# Patient Record
Sex: Male | Born: 1945 | Race: White | Hispanic: No | Marital: Married | Smoking: Former smoker
Health system: Southern US, Community
[De-identification: ages and names within clinical notes are randomized; demographics above are authoritative.]

## PROBLEM LIST (undated history)

## (undated) DIAGNOSIS — K4090 Unilateral inguinal hernia, without obstruction or gangrene, not specified as recurrent: Secondary | ICD-10-CM

## (undated) DIAGNOSIS — K219 Gastro-esophageal reflux disease without esophagitis: Secondary | ICD-10-CM

## (undated) DIAGNOSIS — Z87891 Personal history of nicotine dependence: Principal | ICD-10-CM

## (undated) DIAGNOSIS — D72829 Elevated white blood cell count, unspecified: Secondary | ICD-10-CM

## (undated) DIAGNOSIS — K5792 Diverticulitis of intestine, part unspecified, without perforation or abscess without bleeding: Secondary | ICD-10-CM

## (undated) DIAGNOSIS — I2510 Atherosclerotic heart disease of native coronary artery without angina pectoris: Secondary | ICD-10-CM

## (undated) DIAGNOSIS — J449 Chronic obstructive pulmonary disease, unspecified: Secondary | ICD-10-CM

## (undated) HISTORY — DX: Diverticulitis of intestine, part unspecified, without perforation or abscess without bleeding: K57.92

## (undated) HISTORY — PX: TONSILLECTOMY: SUR1361

## (undated) HISTORY — DX: Chronic obstructive pulmonary disease, unspecified: J44.9

## (undated) HISTORY — PX: HERNIA REPAIR: SHX51

## (undated) HISTORY — DX: Unilateral inguinal hernia, without obstruction or gangrene, not specified as recurrent: K40.90

## (undated) HISTORY — DX: Atherosclerotic heart disease of native coronary artery without angina pectoris: I25.10

## (undated) HISTORY — DX: Gastro-esophageal reflux disease without esophagitis: K21.9

## (undated) HISTORY — DX: Elevated white blood cell count, unspecified: D72.829

## (undated) HISTORY — DX: Personal history of nicotine dependence: Z87.891

---

## 2014-03-11 DIAGNOSIS — M9903 Segmental and somatic dysfunction of lumbar region: Secondary | ICD-10-CM | POA: Diagnosis not present

## 2014-03-11 DIAGNOSIS — M5442 Lumbago with sciatica, left side: Secondary | ICD-10-CM | POA: Diagnosis not present

## 2014-06-01 DIAGNOSIS — M1712 Unilateral primary osteoarthritis, left knee: Secondary | ICD-10-CM | POA: Diagnosis not present

## 2014-06-01 DIAGNOSIS — M170 Bilateral primary osteoarthritis of knee: Secondary | ICD-10-CM | POA: Diagnosis not present

## 2015-06-23 DIAGNOSIS — W57XXXA Bitten or stung by nonvenomous insect and other nonvenomous arthropods, initial encounter: Secondary | ICD-10-CM | POA: Diagnosis not present

## 2015-06-23 DIAGNOSIS — S50361A Insect bite (nonvenomous) of right elbow, initial encounter: Secondary | ICD-10-CM | POA: Diagnosis not present

## 2015-06-23 DIAGNOSIS — R21 Rash and other nonspecific skin eruption: Secondary | ICD-10-CM | POA: Diagnosis not present

## 2015-07-05 ENCOUNTER — Ambulatory Visit (INDEPENDENT_AMBULATORY_CARE_PROVIDER_SITE_OTHER): Payer: Medicare Other | Admitting: Family Medicine

## 2015-07-05 ENCOUNTER — Encounter: Payer: Self-pay | Admitting: Family Medicine

## 2015-07-05 VITALS — BP 110/72 | HR 70 | Temp 97.9°F | Ht 71.0 in | Wt 166.1 lb

## 2015-07-05 DIAGNOSIS — R252 Cramp and spasm: Secondary | ICD-10-CM

## 2015-07-05 DIAGNOSIS — Z0001 Encounter for general adult medical examination with abnormal findings: Secondary | ICD-10-CM | POA: Diagnosis not present

## 2015-07-05 DIAGNOSIS — H9193 Unspecified hearing loss, bilateral: Secondary | ICD-10-CM

## 2015-07-05 DIAGNOSIS — Z136 Encounter for screening for cardiovascular disorders: Secondary | ICD-10-CM | POA: Diagnosis not present

## 2015-07-05 DIAGNOSIS — H9190 Unspecified hearing loss, unspecified ear: Secondary | ICD-10-CM | POA: Insufficient documentation

## 2015-07-05 DIAGNOSIS — Z87891 Personal history of nicotine dependence: Secondary | ICD-10-CM | POA: Insufficient documentation

## 2015-07-05 DIAGNOSIS — R739 Hyperglycemia, unspecified: Secondary | ICD-10-CM | POA: Diagnosis not present

## 2015-07-05 DIAGNOSIS — R21 Rash and other nonspecific skin eruption: Secondary | ICD-10-CM | POA: Diagnosis not present

## 2015-07-05 DIAGNOSIS — Z13220 Encounter for screening for lipoid disorders: Secondary | ICD-10-CM

## 2015-07-05 DIAGNOSIS — Z1159 Encounter for screening for other viral diseases: Secondary | ICD-10-CM | POA: Diagnosis not present

## 2015-07-05 LAB — COMPREHENSIVE METABOLIC PANEL
ALT: 13 U/L (ref 0–53)
AST: 14 U/L (ref 0–37)
Albumin: 4.1 g/dL (ref 3.5–5.2)
Alkaline Phosphatase: 70 U/L (ref 39–117)
BILIRUBIN TOTAL: 0.6 mg/dL (ref 0.2–1.2)
BUN: 15 mg/dL (ref 6–23)
CALCIUM: 9.4 mg/dL (ref 8.4–10.5)
CO2: 25 meq/L (ref 19–32)
CREATININE: 1.22 mg/dL (ref 0.40–1.50)
Chloride: 104 mEq/L (ref 96–112)
GFR: 62.33 mL/min (ref >60.00)
GLUCOSE: 96 mg/dL (ref 70–99)
Potassium: 4.2 mEq/L (ref 3.5–5.1)
SODIUM: 136 meq/L (ref 135–145)
Total Protein: 7.2 g/dL (ref 6.0–8.3)

## 2015-07-05 LAB — LIPID PANEL
CHOL/HDL RATIO: 6
Cholesterol: 155 mg/dL (ref 0–200)
HDL: 24.9 mg/dL — ABNORMAL LOW (ref >39.00)
LDL Cholesterol: 98 mg/dL (ref 0–99)
NONHDL: 129.68
TRIGLYCERIDES: 156 mg/dL — AB (ref 0.0–149.0)
VLDL: 31.2 mg/dL (ref 0.0–40.0)

## 2015-07-05 LAB — CBC
HCT: 47.7 % (ref 39.0–52.0)
HEMOGLOBIN: 16 g/dL (ref 13.0–17.0)
MCHC: 33.5 g/dL (ref 30.0–36.0)
MCV: 88.9 fl (ref 78.0–100.0)
PLATELETS: 344 10*3/uL (ref 150.0–400.0)
RBC: 5.36 Mil/uL (ref 4.22–5.81)
RDW: 13.7 % (ref 11.5–15.5)
WBC: 12.8 10*3/uL — ABNORMAL HIGH (ref 4.0–10.5)

## 2015-07-05 LAB — HEMOGLOBIN A1C: HEMOGLOBIN A1C: 5.9 % (ref 4.6–6.5)

## 2015-07-05 MED ORDER — TETANUS-DIPHTH-ACELL PERTUSSIS 5-2.5-18.5 LF-MCG/0.5 IM SUSP: 0.5000 mL | Freq: Once | INTRAMUSCULAR | Status: DC

## 2015-07-05 MED ORDER — TRIAMCINOLONE ACETONIDE 0.5 % EX OINT: 1.0000 "application " | TOPICAL_OINTMENT | Freq: Two times a day (BID) | CUTANEOUS | Status: DC

## 2015-07-05 NOTE — Assessment & Plan Note (Signed)
New problem (to me). Unclear etiology. Sending to dermatology. Treating empirically with Triamcinolone.

## 2015-07-05 NOTE — Assessment & Plan Note (Signed)
New problem. Referring to audiology for evaluation.

## 2015-07-05 NOTE — Assessment & Plan Note (Signed)
Colonoscopy up-to-date. Rx given for tetanus. Patient declined pneumococcal vaccine. Screening labs today including PSA. Arranging for screening CT (per USPSTF guidelines).

## 2015-07-05 NOTE — Progress Notes (Signed)
Subjective:  Patient ID: Christian Hebert, male    DOB: Sep 19, 1945  Age: 70 y.o. MRN: [id number]  CC: Establish care, Rash, Hearing loss  HPI Christian Hebert is a 70 y.o. male presents to the clinic today to establish care.   Preventative Healthcare  Colonoscopy: ~ 9 years ago. Due for in 2018.  Immunizations  Tetanus - In need of.  Pneumococcal - In need of but declines today.  Flu - N/A at this time.  Zoster - Candidate for.  Prostate cancer screening: PSA today.   Hepatitis C screening - Candidate for.   Labs: Screening labs today.   Exercise: No regular exercise but does work outside regularly.  Alcohol use: No.  Smoking/tobacco use: Former smoker.   Rash  Has had a rash on his lower legs for decades.  He states that it started after he went to [place]. It has persisted since then.  He states that he seen dermatology at the [facility] and it was biopsied. Unknown etiology.  He states that it is itchy.  He would like something for the itching. Will discuss dermatology referral today.  Hearing loss  Chronic, long standing.  Hard of hearing.  Wife and patient would like evaluation and consideration for hearing aids.  PMH, Surgical Hx, Family Hx, Social History reviewed and updated as below.  Past Medical History  Diagnosis Date  . GERD (gastroesophageal reflux disease)    Past Surgical History  Procedure Laterality Date  . Tonsillectomy    . Hernia repair      bilateral inguinal    Family History  Problem Relation Age of Onset  . Stroke Mother   . Hypertension Mother   . Heart disease Father   . Hypertension Father    Social History  Substance Use Topics  . Smoking status: Former Smoker -- 1.00 packs/day for 50 years    Types: Cigarettes  . Smokeless tobacco: Never Used  . Alcohol Use: No   Review of Systems  HENT: Positive for hearing loss.   Musculoskeletal:       Muscle cramps.  All other systems reviewed and are  negative.  Objective:   Today's Vitals: BP 110/72 mmHg  Pulse 70  Temp(Src) 97.9 F (36.6 C) (Oral)  Ht 5' 11"  (1.803 m)  Wt 166 lb 2 oz (75.354 kg)  BMI 23.18 kg/m2  SpO2 95%  Physical Exam  Constitutional: He is oriented to person, place, and time. He appears well-developed and well-nourished. No distress.  HENT:  Head: Normocephalic and atraumatic.  Nose: Nose normal.  Mouth/Throat: Oropharynx is clear and moist. No oropharyngeal exudate.  Normal TM's bilaterally.   Eyes: Conjunctivae are normal. No scleral icterus.  Neck: Neck supple. No thyromegaly present.  Cardiovascular: Normal rate and regular rhythm.   No murmur heard. Diminished DP pulses bilaterally (but present).  Pulmonary/Chest: Effort normal and breath sounds normal. He has no wheezes. He has no rales.  Abdominal: Soft. He exhibits no distension. There is no tenderness. There is no rebound and no guarding. Hernia confirmed negative in the right inguinal area and confirmed negative in the left inguinal area.  Scars noted from prior inguinal hernia repairs.  Genitourinary: Testes normal and penis normal. Uncircumcised.  Musculoskeletal: Normal range of motion. He exhibits no edema.  Lymphadenopathy:    He has no cervical adenopathy.  Neurological: He is alert and oriented to person, place, and time.  Skin:  Lower legs with a nodular, scaly rash (posterior calf). Excoriation noted.  Psychiatric: He has a normal mood and affect.  Vitals reviewed.  Assessment & Plan:   Problem List Items Addressed This Visit    Rash    New problem (to me). Unclear etiology. Sending to dermatology. Treating empirically with Triamcinolone.      Relevant Orders   Ambulatory referral to Dermatology   Hearing loss    New problem. Referring to audiology for evaluation.       Relevant Orders   Ambulatory referral to Audiology   Encounter for preventative adult health care exam with abnormal findings - Primary     Colonoscopy up-to-date. Rx given for tetanus. Patient declined pneumococcal vaccine. Screening labs today including PSA. Arranging for screening CT (per USPSTF guidelines).       Other Visit Diagnoses    Muscle cramps        Relevant Orders    CBC    Comp Met (CMET)    Encounter for lipid screening for cardiovascular disease        Relevant Orders    Lipid Profile    Blood glucose elevated        Relevant Orders    HgB A1c    Need for hepatitis C screening test        Relevant Orders    Hepatitis C antibody      Outpatient Encounter Prescriptions as of 07/05/2015  Medication Sig  . Tdap (BOOSTRIX) 5-2.5-18.5 LF-MCG/0.5 injection Inject 0.5 mLs into the muscle once.  . triamcinolone ointment (KENALOG) 0.5 % Apply 1 application topically 2 (two) times daily.   No facility-administered encounter medications on file as of 07/05/2015.   Follow-up: Annually  [place]

## 2015-07-05 NOTE — Progress Notes (Signed)
Pre visit review using our clinic review tool, if applicable. No additional management support is needed unless otherwise documented below in the visit note. 

## 2015-07-05 NOTE — Patient Instructions (Signed)
We will call with your appts and ct.  Be sure to get your eyes checked.  Follow up annually.  Take care  Dr. Lacinda Axon   Health Maintenance, Male A healthy lifestyle and preventative care can promote health and wellness.  Maintain regular health, dental, and eye exams.  Eat a healthy diet. Foods like vegetables, fruits, whole grains, low-fat dairy products, and lean protein foods contain the nutrients you need and are low in calories. Decrease your intake of foods high in solid fats, added sugars, and salt. Get information about a proper diet from your health care provider, if necessary.  Regular physical exercise is one of the most important things you can do for your health. Most adults should get at least 150 minutes of moderate-intensity exercise (any activity that increases your heart rate and causes you to sweat) each week. In addition, most adults need muscle-strengthening exercises on 2 or more days a week.   Maintain a healthy weight. The body mass index (BMI) is a screening tool to identify possible weight problems. It provides an estimate of body fat based on height and weight. Your health care provider can find your BMI and can help you achieve or maintain a healthy weight. For males 20 years and older:  A BMI below 18.5 is considered underweight.  A BMI of 18.5 to 24.9 is normal.  A BMI of 25 to 29.9 is considered overweight.  A BMI of 30 and above is considered obese.  Maintain normal blood lipids and cholesterol by exercising and minimizing your intake of saturated fat. Eat a balanced diet with plenty of fruits and vegetables. Blood tests for lipids and cholesterol should begin at age 35 and be repeated every 5 years. If your lipid or cholesterol levels are high, you are over age 3, or you are at high risk for heart disease, you may need your cholesterol levels checked more frequently.Ongoing high lipid and cholesterol levels should be treated with medicines if diet and  exercise are not working.  If you smoke, find out from your health care provider how to quit. If you do not use tobacco, do not start.  Lung cancer screening is recommended for adults aged 38-80 years who are at high risk for developing lung cancer because of a history of smoking. A yearly low-dose CT scan of the lungs is recommended for people who have at least a 30-pack-year history of smoking and are current smokers or have quit within the past 15 years. A pack year of smoking is smoking an average of 1 pack of cigarettes a day for 1 year (for example, a 30-pack-year history of smoking could mean smoking 1 pack a day for 30 years or 2 packs a day for 15 years). Yearly screening should continue until the smoker has stopped smoking for at least 15 years. Yearly screening should be stopped for people who develop a health problem that would prevent them from having lung cancer treatment.  If you choose to drink alcohol, do not have more than 2 drinks per day. One drink is considered to be 12 oz (360 mL) of beer, 5 oz (150 mL) of wine, or 1.5 oz (45 mL) of liquor.  Avoid the use of street drugs. Do not share needles with anyone. Ask for help if you need support or instructions about stopping the use of drugs.  High blood pressure causes heart disease and increases the risk of stroke. High blood pressure is more likely to develop in:  People  who have blood pressure in the end of the normal range (100-139/85-89 mm Hg).  People who are overweight or obese.  People who are African American.  If you are 23-27 years of age, have your blood pressure checked every 3-5 years. If you are 82 years of age or older, have your blood pressure checked every year. You should have your blood pressure measured twice--once when you are at a hospital or clinic, and once when you are not at a hospital or clinic. Record the average of the two measurements. To check your blood pressure when you are not at a hospital or  clinic, you can use:  An automated blood pressure machine at a pharmacy.  A home blood pressure monitor.  If you are 70-45 years old, ask your health care provider if you should take aspirin to prevent heart disease.  Diabetes screening involves taking a blood sample to check your fasting blood sugar level. This should be done once every 3 years after age 84 if you are at a normal weight and without risk factors for diabetes. Testing should be considered at a younger age or be carried out more frequently if you are overweight and have at least 1 risk factor for diabetes.  Colorectal cancer can be detected and often prevented. Most routine colorectal cancer screening begins at the age of 71 and continues through age 11. However, your health care provider may recommend screening at an earlier age if you have risk factors for colon cancer. On a yearly basis, your health care provider may provide home test kits to check for hidden blood in the stool. A small camera at the end of a tube may be used to directly examine the colon (sigmoidoscopy or colonoscopy) to detect the earliest forms of colorectal cancer. Talk to your health care provider about this at age 24 when routine screening begins. A direct exam of the colon should be repeated every 5-10 years through age 56, unless early forms of precancerous polyps or small growths are found.  People who are at an increased risk for hepatitis B should be screened for this virus. You are considered at high risk for hepatitis B if:  You were born in a country where hepatitis B occurs often. Talk with your health care provider about which countries are considered high risk.  Your parents were born in a high-risk country and you have not received a shot to protect against hepatitis B (hepatitis B vaccine).  You have HIV or AIDS.  You use needles to inject street drugs.  You live with, or have sex with, someone who has hepatitis B.  You are a man who has  sex with other men (MSM).  You get hemodialysis treatment.  You take certain medicines for conditions like cancer, organ transplantation, and autoimmune conditions.  Hepatitis C blood testing is recommended for all people born from [birth year] through 1966 and any individual with known risk factors for hepatitis C.  Healthy men should no longer receive prostate-specific antigen (PSA) blood tests as part of routine cancer screening. Talk to your health care provider about prostate cancer screening.  Testicular cancer screening is not recommended for adolescents or adult males who have no symptoms. Screening includes self-exam, a health care provider exam, and other screening tests. Consult with your health care provider about any symptoms you have or any concerns you have about testicular cancer.  Practice safe sex. Use condoms and avoid high-risk sexual practices to reduce the spread of sexually  transmitted infections (STIs).  You should be screened for STIs, including gonorrhea and chlamydia if:  You are sexually active and are younger than 24 years.  You are older than 24 years, and your health care provider tells you that you are at risk for this type of infection.  Your sexual activity has changed since you were last screened, and you are at an increased risk for chlamydia or gonorrhea. Ask your health care provider if you are at risk.  If you are at risk of being infected with HIV, it is recommended that you take a prescription medicine daily to prevent HIV infection. This is called pre-exposure prophylaxis (PrEP). You are considered at risk if:  You are a man who has sex with other men (MSM).  You are a heterosexual man who is sexually active with multiple partners.  You take drugs by injection.  You are sexually active with a partner who has HIV.  Talk with your health care provider about whether you are at high risk of being infected with HIV. If you choose to begin PrEP, you should  first be tested for HIV. You should then be tested every 3 months for as long as you are taking PrEP.  Use sunscreen. Apply sunscreen liberally and repeatedly throughout the day. You should seek shade when your shadow is shorter than you. Protect yourself by wearing long sleeves, pants, a wide-brimmed hat, and sunglasses year round whenever you are outdoors.  Tell your health care provider of new moles or changes in moles, especially if there is a change in shape or color. Also, tell your health care provider if a mole is larger than the size of a pencil eraser.  A one-time screening for abdominal aortic aneurysm (AAA) and surgical repair of large AAAs by ultrasound is recommended for men aged 92-75 years who are current or former smokers.  Stay current with your vaccines (immunizations).   This information is not intended to replace advice given to you by your health care provider. Make sure you discuss any questions you have with your health care provider.   Document Released: 06/22/2007 Document Revised: 01/14/2014 Document Reviewed: 05/21/2010 Elsevier Interactive Patient Education [employer].

## 2015-07-06 LAB — HEPATITIS C ANTIBODY: HCV Ab: NEGATIVE

## 2015-07-12 ENCOUNTER — Encounter: Payer: Self-pay | Admitting: Family Medicine

## 2015-07-12 ENCOUNTER — Telehealth: Payer: Self-pay | Admitting: *Deleted

## 2015-07-12 NOTE — Telephone Encounter (Signed)
Received referral for low dose lung cancer screening CT scan. Voicemail left at phone number listed in EMR for patient to call me back to facilitate scheduling scan.  

## 2015-07-13 ENCOUNTER — Telehealth: Payer: Self-pay | Admitting: *Deleted

## 2015-07-13 NOTE — Telephone Encounter (Signed)
Received referral for initial lung cancer screening scan. Contacted patient and obtained smoking history,(former, quit 2016, 50 pack year history) as well as answering questions related to screening process. Patient denies signs of lung cancer such as weight loss or hemoptysis. Patient denies comorbidity that would prevent curative treatment if lung cancer were found. Patient is tentatively scheduled for shared decision making visit and CT scan on 07/21/15 at 2pm, pending insurance approval from business office.

## 2015-07-20 ENCOUNTER — Other Ambulatory Visit: Payer: Self-pay | Admitting: Family Medicine

## 2015-07-20 ENCOUNTER — Encounter: Payer: Self-pay | Admitting: Family Medicine

## 2015-07-20 DIAGNOSIS — Z87891 Personal history of nicotine dependence: Secondary | ICD-10-CM

## 2015-07-20 HISTORY — DX: Personal history of nicotine dependence: Z87.891

## 2015-07-21 ENCOUNTER — Ambulatory Visit
Admission: RE | Admit: 2015-07-21 | Discharge: 2015-07-21 | Disposition: A | Discharge status: Home / Self Care | Payer: Medicare Other | Source: Ambulatory Visit | Attending: Family Medicine | Admitting: Family Medicine

## 2015-07-21 ENCOUNTER — Inpatient Hospital Stay: Payer: Medicare Other | Attending: Family Medicine | Admitting: Family Medicine

## 2015-07-21 ENCOUNTER — Encounter: Payer: Self-pay | Admitting: Family Medicine

## 2015-07-21 DIAGNOSIS — I2510 Atherosclerotic heart disease of native coronary artery without angina pectoris: Secondary | ICD-10-CM | POA: Diagnosis not present

## 2015-07-21 DIAGNOSIS — J439 Emphysema, unspecified: Secondary | ICD-10-CM | POA: Insufficient documentation

## 2015-07-21 DIAGNOSIS — Z122 Encounter for screening for malignant neoplasm of respiratory organs: Secondary | ICD-10-CM | POA: Diagnosis not present

## 2015-07-21 DIAGNOSIS — I700 Atherosclerosis of aorta: Secondary | ICD-10-CM | POA: Diagnosis not present

## 2015-07-21 DIAGNOSIS — Z87891 Personal history of nicotine dependence: Secondary | ICD-10-CM | POA: Insufficient documentation

## 2015-07-21 NOTE — Progress Notes (Signed)
In accordance with CMS guidelines, patient has meet eligibility criteria including age, absence of signs or symptoms of lung cancer, the specific calculation of cigarette smoking pack-years was 50 years and is a former smoker, having quit in 2016.   A shared decision-making session was conducted prior to the performance of CT scan. This includes one or more decision aids, includes benefits and harms of screening, follow-up diagnostic testing, over-diagnosis, false positive rate, and total radiation exposure.  Counseling on the importance of adherence to annual lung cancer LDCT screening, impact of co-morbidities, and ability or willingness to undergo diagnosis and treatment is imperative for compliance of the program.  Counseling on the importance of continued smoking cessation for former smokers; the importance of smoking cessation for current smokers and information about tobacco cessation interventions have been given to patient including the [place] at [facility], 1800 quit [employer], as well as [place] specific smoking cessation programs.  Written order for lung cancer screening with LDCT has been given to the patient and any and all questions have been answered to the best of my abilities.   Yearly follow up will be scheduled by Burgess Estelle, Thoracic Navigator.

## 2015-07-24 ENCOUNTER — Telehealth: Payer: Self-pay | Admitting: *Deleted

## 2015-07-24 NOTE — Telephone Encounter (Signed)
Notified patient of LDCT lung cancer screening results with recommendation for 12 month follow up imaging. Also notified of incidental finding noted below. Patient verbalizes understanding.   IMPRESSION: 1. Lung-RADS Category 1, negative. Continue annual screening with low-dose chest CT without contrast in 12 months. 2. Diffuse bronchial wall thickening with emphysema, as above; imaging findings suggestive of underlying COPD. 3. Aortic atherosclerosis and coronary artery calcification.

## 2015-09-05 ENCOUNTER — Other Ambulatory Visit: Payer: Self-pay | Admitting: Family Medicine

## 2015-09-05 DIAGNOSIS — H9190 Unspecified hearing loss, unspecified ear: Secondary | ICD-10-CM

## 2015-09-08 ENCOUNTER — Encounter: Payer: Self-pay | Admitting: Family Medicine

## 2015-09-14 ENCOUNTER — Ambulatory Visit: Payer: Medicare Other | Admitting: Audiology

## 2015-10-02 DIAGNOSIS — L819 Disorder of pigmentation, unspecified: Secondary | ICD-10-CM | POA: Diagnosis not present

## 2015-10-02 DIAGNOSIS — R202 Paresthesia of skin: Secondary | ICD-10-CM | POA: Diagnosis not present

## 2015-10-02 DIAGNOSIS — L700 Acne vulgaris: Secondary | ICD-10-CM | POA: Diagnosis not present

## 2015-10-02 DIAGNOSIS — L299 Pruritus, unspecified: Secondary | ICD-10-CM | POA: Diagnosis not present

## 2015-10-02 DIAGNOSIS — Z1283 Encounter for screening for malignant neoplasm of skin: Secondary | ICD-10-CM | POA: Diagnosis not present

## 2015-10-02 DIAGNOSIS — L280 Lichen simplex chronicus: Secondary | ICD-10-CM | POA: Diagnosis not present

## 2015-10-02 DIAGNOSIS — L578 Other skin changes due to chronic exposure to nonionizing radiation: Secondary | ICD-10-CM | POA: Diagnosis not present

## 2015-10-10 DIAGNOSIS — H9312 Tinnitus, left ear: Secondary | ICD-10-CM | POA: Diagnosis not present

## 2015-10-10 DIAGNOSIS — H903 Sensorineural hearing loss, bilateral: Secondary | ICD-10-CM | POA: Diagnosis not present

## 2015-10-13 ENCOUNTER — Encounter: Payer: Self-pay | Admitting: Family Medicine

## 2015-10-13 ENCOUNTER — Ambulatory Visit (INDEPENDENT_AMBULATORY_CARE_PROVIDER_SITE_OTHER): Payer: Medicare Other | Admitting: Family Medicine

## 2015-10-13 VITALS — BP 126/88 | HR 67 | Temp 97.8°F | Wt 164.3 lb

## 2015-10-13 DIAGNOSIS — M2550 Pain in unspecified joint: Secondary | ICD-10-CM | POA: Diagnosis not present

## 2015-10-13 DIAGNOSIS — I2584 Coronary atherosclerosis due to calcified coronary lesion: Secondary | ICD-10-CM

## 2015-10-13 DIAGNOSIS — J449 Chronic obstructive pulmonary disease, unspecified: Secondary | ICD-10-CM | POA: Insufficient documentation

## 2015-10-13 DIAGNOSIS — R21 Rash and other nonspecific skin eruption: Secondary | ICD-10-CM | POA: Diagnosis not present

## 2015-10-13 DIAGNOSIS — I700 Atherosclerosis of aorta: Secondary | ICD-10-CM | POA: Diagnosis not present

## 2015-10-13 DIAGNOSIS — I2510 Atherosclerotic heart disease of native coronary artery without angina pectoris: Secondary | ICD-10-CM

## 2015-10-13 DIAGNOSIS — Z136 Encounter for screening for cardiovascular disorders: Secondary | ICD-10-CM

## 2015-10-13 LAB — SEDIMENTATION RATE: SED RATE: 21 mm/h — AB (ref 0–20)

## 2015-10-13 LAB — RHEUMATOID FACTOR: Rhuematoid fact SerPl-aCnc: <14 IU/mL (ref <14)

## 2015-10-13 NOTE — Assessment & Plan Note (Signed)
Needs [place]. Will arrange.

## 2015-10-13 NOTE — Patient Instructions (Signed)
We will call with your results and with the referral.  Take care  Dr. Lacinda Axon

## 2015-10-13 NOTE — Progress Notes (Signed)
Subjective:  Patient ID: Christian Hebert, male    DOB: 11-26-1945  Age: 70 y.o. MRN: [id number]  CC: Joint pain, rash  HPI:  70 year old male with a history of tobacco abuse, COPD presents with the above complaints.  Patient states that for the past week he's had joint pain and areas of rash. He states that he's had bilateral elbow pain, knee pain, neck and shoulder pain. He's had some scattered areas of rash particularly on lower arms. He reports associated fatigue and not feeling well. He states that he feels similar to when he was bitten by a tick earlier this year. He is concerned about tickborne illness. No recent tick bite. He has been hunting recently. He's also been quite physically active. No known exacerbating or relieving factors. She does note that he had swelling/effusion of his right elbow recently. No other reported symptoms.  Additionally, patient has had lung cancer screening. CT revealed aortic atherosclerosis and coronary artery calcification. We need to discuss this today.  Social Hx   Social History   Social History  . Marital status: Married    Spouse name: N/A  . Number of children: N/A  . Years of education: N/A   Social History Main Topics  . Smoking status: Former Smoker    Packs/day: 1.00    Years: 50.00    Types: Cigarettes    Quit date: 01/07/2014  . Smokeless tobacco: Never Used  . Alcohol use No  . Drug use: No  . Sexual activity: Not Asked   Other Topics Concern  . None   Social History Narrative  . None   Review of Systems  Constitutional: Positive for fatigue.  Musculoskeletal: Positive for arthralgias.  Skin: Positive for rash.   Objective:  BP 126/88 (BP Location: Right Arm, Patient Position: Sitting, Cuff Size: Normal)   Pulse 67   Temp 97.8 F (36.6 C) (Oral)   Wt 164 lb 5 oz (74.5 kg)   SpO2 94%   BMI 22.60 kg/m   BP/Weight 10/13/2015 07/21/2015 99991111  Systolic BP 123XX123 - [id number]  Diastolic BP 88 - 72  Wt. (Lbs) 164.31 167  166.13  BMI 22.6 22.97 23.18   Physical Exam  Constitutional: He is oriented to person, place, and time. He appears well-developed. No distress.  Cardiovascular: Normal rate and regular rhythm.   Pulmonary/Chest: Effort normal. He has no wheezes. He has no rales.  Musculoskeletal:  Normal range of motion of the right and left elbows. No evidence of effusion. No erythema. No tenderness to palpation.  Trapezius muscle tension noted bilaterally. Normal range of motion of the neck.  Bilateral knees - no erythema or effusion. Normal range of motion. Ligaments intact.  Neurological: He is alert and oriented to person, place, and time.  Skin:  Left elbow with a area of erythema and excoriation. Right arm with a few erythematous macules.  Psychiatric: He has a normal mood and affect.  Vitals reviewed.  Lab Results  Component Value Date   WBC 12.8 (H) 07/05/2015   HGB 16.0 07/05/2015   HCT 47.7 07/05/2015   PLT 344.0 07/05/2015   GLUCOSE 96 07/05/2015   CHOL 155 07/05/2015   TRIG 156.0 (H) 07/05/2015   HDL 24.90 (L) 07/05/2015   LDLCALC 98 07/05/2015   ALT 13 07/05/2015   AST 14 07/05/2015   NA 136 07/05/2015   K 4.2 07/05/2015   CL 104 07/05/2015   CREATININE 1.22 07/05/2015   BUN 15 07/05/2015   CO2 25  07/05/2015   HGBA1C 5.9 07/05/2015   Assessment & Plan:   Problem List Items Addressed This Visit    Aortic atherosclerosis ([place])    Needs [place]. Will arrange.      Coronary artery calcification    New problem. Noted on CT lung cancer screening. Needs to see cardiology for consideration for stress test.      Polyarthralgia - Primary    New problem. Uncertain etiology/prognosis at this time. Patient and wife concerned about tickborne illness. I discussed with them the low likelihood/probability of this. They would like testing. We'll proceed with Lyme and RMSF titers. Given reported right elbow swelling and other joint pain, obtaining rheumatologic workup (see  orders). Will have follow-up pending laboratory results.      Relevant Orders   Rocky mtn spotted fvr abs pnl(IgG+IgM)   Sedimentation rate   Rheumatoid Factor   Cyclic citrul peptide antibody, IgG   Antinuclear Antib (ANA)   Lyme Aby, Western Blot IgG & IgM w/bands   Rash    Likely from recent activity in the woods. PRN Triamcinolone.       Other Visit Diagnoses   None.    Follow-up: PRN  [place]

## 2015-10-13 NOTE — Assessment & Plan Note (Signed)
Likely from recent activity in the woods. PRN Triamcinolone.

## 2015-10-13 NOTE — Assessment & Plan Note (Signed)
New problem. Uncertain etiology/prognosis at this time. Patient and wife concerned about tickborne illness. I discussed with them the low likelihood/probability of this. They would like testing. We'll proceed with Lyme and RMSF titers. Given reported right elbow swelling and other joint pain, obtaining rheumatologic workup (see orders). Will have follow-up pending laboratory results.

## 2015-10-13 NOTE — Assessment & Plan Note (Signed)
New problem. Noted on CT lung cancer screening. Needs to see cardiology for consideration for stress test.

## 2015-10-13 NOTE — Progress Notes (Signed)
Pre visit review using our clinic review tool, if applicable. No additional management support is needed unless otherwise documented below in the visit note. 

## 2015-10-16 ENCOUNTER — Other Ambulatory Visit: Payer: Self-pay | Admitting: Family Medicine

## 2015-10-16 ENCOUNTER — Telehealth: Payer: Self-pay | Admitting: Family Medicine

## 2015-10-16 LAB — LYME ABY, WSTRN BLT IGG & IGM W/BANDS
B BURGDORFERI IGG ABS (IB): NEGATIVE
B BURGDORFERI IGM ABS (IB): NEGATIVE
LYME DISEASE 23 KD IGM: NONREACTIVE
LYME DISEASE 39 KD IGG: NONREACTIVE
LYME DISEASE 41 KD IGG: NONREACTIVE
LYME DISEASE 58 KD IGG: NONREACTIVE
LYME DISEASE 66 KD IGG: NONREACTIVE
LYME DISEASE 93 KD IGG: NONREACTIVE
Lyme Disease 18 kD IgG: NONREACTIVE
Lyme Disease 23 kD IgG: NONREACTIVE
Lyme Disease 28 kD IgG: NONREACTIVE
Lyme Disease 30 kD IgG: NONREACTIVE
Lyme Disease 39 kD IgM: NONREACTIVE
Lyme Disease 41 kD IgM: NONREACTIVE
Lyme Disease 45 kD IgG: NONREACTIVE

## 2015-10-16 LAB — ANA: Anti Nuclear Antibody(ANA): NEGATIVE

## 2015-10-16 LAB — CYCLIC CITRUL PEPTIDE ANTIBODY, IGG: Cyclic Citrullin Peptide Ab: <16 Units

## 2015-10-16 NOTE — Telephone Encounter (Signed)
Pt requesting results of his blood test from last week.

## 2015-10-16 NOTE — Telephone Encounter (Signed)
Pt was told that the test has not been resulted. Pt was told we would be in touch when the results come in.

## 2015-10-17 ENCOUNTER — Other Ambulatory Visit: Payer: Self-pay

## 2015-10-17 DIAGNOSIS — I719 Aortic aneurysm of unspecified site, without rupture: Secondary | ICD-10-CM

## 2015-10-17 LAB — REFLEX RMSF IGG TITER: RMSF IgG Titer: 1:64 {titer} — ABNORMAL HIGH

## 2015-10-17 LAB — ROCKY MTN SPOTTED FVR ABS PNL(IGG+IGM)
RMSF IgG: DETECTED — AB
RMSF IgM: NOT DETECTED

## 2015-10-17 NOTE — Progress Notes (Signed)
Amber from called from hospital and needed orders changed.

## 2015-10-20 ENCOUNTER — Ambulatory Visit
Admission: RE | Admit: 2015-10-20 | Discharge: 2015-10-20 | Disposition: A | Discharge status: Home / Self Care | Payer: Medicare Other | Source: Ambulatory Visit | Attending: Family Medicine | Admitting: Family Medicine

## 2015-10-20 DIAGNOSIS — N281 Cyst of kidney, acquired: Secondary | ICD-10-CM | POA: Insufficient documentation

## 2015-10-20 DIAGNOSIS — I719 Aortic aneurysm of unspecified site, without rupture: Secondary | ICD-10-CM

## 2015-10-20 DIAGNOSIS — I700 Atherosclerosis of aorta: Secondary | ICD-10-CM | POA: Diagnosis not present

## 2015-10-23 ENCOUNTER — Other Ambulatory Visit: Payer: Self-pay | Admitting: Family Medicine

## 2015-10-23 DIAGNOSIS — I2584 Coronary atherosclerosis due to calcified coronary lesion: Principal | ICD-10-CM

## 2015-10-23 DIAGNOSIS — I2510 Atherosclerotic heart disease of native coronary artery without angina pectoris: Secondary | ICD-10-CM

## 2015-11-01 ENCOUNTER — Telehealth: Payer: Self-pay | Admitting: *Deleted

## 2015-11-01 NOTE — Telephone Encounter (Signed)
Patient requested a update on the stress test referral Pt contact [phone]

## 2015-11-27 ENCOUNTER — Encounter (INDEPENDENT_AMBULATORY_CARE_PROVIDER_SITE_OTHER): Payer: Self-pay

## 2015-11-27 ENCOUNTER — Ambulatory Visit: Payer: Medicare Other | Admitting: Cardiovascular Disease

## 2015-12-12 ENCOUNTER — Encounter: Payer: Self-pay | Admitting: Cardiovascular Disease

## 2015-12-12 ENCOUNTER — Encounter (INDEPENDENT_AMBULATORY_CARE_PROVIDER_SITE_OTHER): Payer: Self-pay

## 2015-12-12 ENCOUNTER — Ambulatory Visit (INDEPENDENT_AMBULATORY_CARE_PROVIDER_SITE_OTHER): Payer: Medicare Other | Admitting: Cardiovascular Disease

## 2015-12-12 VITALS — BP 130/72 | HR 58 | Ht 71.0 in | Wt 163.2 lb

## 2015-12-12 DIAGNOSIS — I2584 Coronary atherosclerosis due to calcified coronary lesion: Secondary | ICD-10-CM

## 2015-12-12 DIAGNOSIS — E782 Mixed hyperlipidemia: Secondary | ICD-10-CM | POA: Diagnosis not present

## 2015-12-12 DIAGNOSIS — R0602 Shortness of breath: Secondary | ICD-10-CM | POA: Diagnosis not present

## 2015-12-12 DIAGNOSIS — I2510 Atherosclerotic heart disease of native coronary artery without angina pectoris: Secondary | ICD-10-CM | POA: Diagnosis not present

## 2015-12-12 DIAGNOSIS — I700 Atherosclerosis of aorta: Secondary | ICD-10-CM

## 2015-12-12 MED ORDER — ASPIRIN EC 81 MG PO TBEC: 81.0000 mg | DELAYED_RELEASE_TABLET | Freq: Every day | ORAL | 3 refills | Status: DC

## 2015-12-12 NOTE — Progress Notes (Signed)
Cardiology Office Note   Date:  12/12/2015   ID:  Raye, Bickhart 1945-02-22, MRN [id number]  PCP:  Coral Spikes, DO  Cardiologist:   Kathlyn Sacramento, MD   Chief Complaint  Patient presents with  . other    Ref by Lacinda Axon for coronary artery calcification. Meds reviewed by the pt. verbally.       History of Present Illness: Christian Hebert is a 70 y.o. male who was referred by Dr. Lacinda Axon for evaluation of coronary and aortic atherosclerosis. He has no previous cardiac history. He has known history of COPD and tobacco use. He underwent lung cancer screening which showed no evidence of pulmonary nodules. It did show aortic atherosclerosis as well as calcifications in the RCA and LAD distribution. He underwent abdominal aortic ultrasound which showed no evidence of aortic aneurysm. He has no hypertension or diabetes. He has prolonged history of tobacco use but quit a few years ago and currently smokes occasional cigars. He did smoke one and a half pack per day for about 50 years. He denies any chest discomfort . He complains of exertional dyspnea which has been gradually worsening over the last few years. He has no orthopnea, PND or leg edema.    Past Medical History:  Diagnosis Date  . GERD (gastroesophageal reflux disease)   . Personal history of tobacco use, presenting hazards to health 07/20/2015    Past Surgical History:  Procedure Laterality Date  . HERNIA REPAIR     bilateral inguinal   . TONSILLECTOMY       Current Outpatient Prescriptions  Medication Sig Dispense Refill  . aspirin EC 81 MG tablet Take 1 tablet (81 mg total) by mouth daily. 90 tablet 3  . Tdap (BOOSTRIX) 5-2.5-18.5 LF-MCG/0.5 injection Inject 0.5 mLs into the muscle once. 0.5 mL 0   No current facility-administered medications for this visit.     Allergies:   Oxycodone    Social History:  The patient  reports that he quit smoking about 23 months ago. His smoking use included Cigarettes. He has  a 50.00 pack-year smoking history. He has never used smokeless tobacco. He reports that he does not drink alcohol or use drugs.   Family History:  The patient's family history includes Heart disease in his father; Hypertension in his father and mother; Stroke in his mother.    ROS:  Please see the history of present illness.   Otherwise, review of systems are positive for none.   All other systems are reviewed and negative.    PHYSICAL EXAM: VS:  BP 130/72 (BP Location: Right Arm, Patient Position: Sitting, Cuff Size: Normal)   Pulse (!) 58   Ht 5\' 11"  (1.803 m)   Wt 163 lb 4 oz (74 kg)   BMI 22.77 kg/m  , BMI Body mass index is 22.77 kg/m. GEN: Well nourished, well developed, in no acute distress  HEENT: normal  Neck: no JVD, carotid bruits, or masses Cardiac: RRR; no murmurs, rubs, or gallops,no edema  Respiratory:  clear to auscultation bilaterally, normal work of breathing GI: soft, nontender, nondistended, + BS MS: no deformity or atrophy  Skin: warm and dry, no rash Neuro:  Strength and sensation are intact Psych: euthymic mood, full affect Distal pulses are normal.  EKG:  EKG is ordered today. The ekg ordered today demonstrates sinus bradycardia with first-degree AV block. No significant ST or T wave changes.   Recent Labs: 07/05/2015: ALT 13; BUN 15; Creatinine, Ser  1.22; Hemoglobin 16.0; Platelets 344.0; Potassium 4.2; Sodium 136    Lipid Panel    Component Value Date/Time   CHOL 155 07/05/2015 1358   TRIG 156.0 (H) 07/05/2015 1358   HDL 24.90 (L) 07/05/2015 1358   CHOLHDL 6 07/05/2015 1358   VLDL 31.2 07/05/2015 1358   LDLCALC 98 07/05/2015 1358      Wt Readings from Last 3 Encounters:  12/12/15 163 lb 4 oz (74 kg)  10/13/15 164 lb 5 oz (74.5 kg)  07/21/15 167 lb (75.8 kg)       PAD Screen 12/12/2015  Previous PAD dx? No  Previous surgical procedure? No  Pain with walking? No  Feet/toe relief with dangling? No  Painful, non-healing ulcers? No    Extremities discolored? No      ASSESSMENT AND PLAN:  1.  Coronary atherosclerosis: This was noted on CT scan of the lungs. His symptoms included exertional dyspnea without chest discomfort. I requested a treadmill nuclear stress test to ensure no evidence of obstructive coronary artery disease. I discussed with him the importance of healthy lifestyle changes and regular exercise. I advised him to start taking aspirin 81 mg once daily.  2. Borderline hyperlipidemia: I reviewed his most recent lipid profile which showed an LDL of 98 and triglyceride of 156. His estimated 10 year ASCVD risk is 22% which is mostly driven by age. I think treatment with a statin for primary prevention is optional and less his stress test comes back abnormal.   Disposition:   FU with me as needed.   Signed,  Kathlyn Sacramento, MD  12/12/2015 3:54 PM    [place]

## 2015-12-12 NOTE — Patient Instructions (Addendum)
Medication Instructions:  Your physician has recommended you make the following change in your medication:  START taking aspirin 81mg  once daily   Labwork: none  Testing/Procedures: Your physician has requested that you have a lexiscan myoview. For further information please visit [email or web address]. Please follow instruction sheet, as given.  [place]  Your caregiver has ordered a Stress Test with nuclear imaging. The purpose of this test is to evaluate the blood supply to your heart muscle. This procedure is referred to as a "Non-Invasive Stress Test." This is because other than having an IV started in your vein, nothing is inserted or "invades" your body. Cardiac stress tests are done to find areas of poor blood flow to the heart by determining the extent of coronary artery disease (CAD). Some patients exercise on a treadmill, which naturally increases the blood flow to your heart, while others who are  unable to walk on a treadmill due to physical limitations have a pharmacologic/chemical stress agent called Lexiscan . This medicine will mimic walking on a treadmill by temporarily increasing your coronary blood flow.   Please note: these test may take anywhere between 2-4 hours to complete  PLEASE REPORT TO [facility] AT THE FIRST DESK WILL DIRECT YOU WHERE TO GO  Date of Procedure:_Thursday, December 14_  Arrival Time for Procedure:__7:45am______  Instructions regarding medication:  You may take your morning medication with a sip of water.  PLEASE NOTIFY THE OFFICE AT LEAST 53 HOURS IN ADVANCE IF YOU ARE UNABLE TO KEEP YOUR APPOINTMENT.  [phone] AND  PLEASE NOTIFY NUCLEAR MEDICINE AT [facility] AT LEAST 24 HOURS IN ADVANCE IF YOU ARE UNABLE TO KEEP YOUR APPOINTMENT. [phone]  How to prepare for your Myoview test:  1. Do not eat or drink after midnight 2. No caffeine for 24 hours prior to test 3. No smoking 24 hours prior to  test. 4. Your medication may be taken with water.  If your doctor stopped a medication because of this test, do not take that medication. 5. Ladies, please do not wear dresses.  Skirts or pants are appropriate. Please wear a short sleeve shirt. 6. No perfume, cologne or lotion. 7. Wear comfortable walking shoes. No heels!            Follow-Up: Your physician recommends that you schedule a follow-up appointment as needed with Dr. Fletcher Anon.    Any Other Special Instructions Will Be Listed Below (If Applicable).     If you need a refill on your cardiac medications before your next appointment, please call your pharmacy.  Cardiac Nuclear Scanning A cardiac nuclear scan is used to check your heart for problems, such as the following:  A portion of the heart is not getting enough blood.  Part of the heart muscle has died, which happens with a heart attack.  The heart wall is not working normally.  In this test, a radioactive dye (tracer) is injected into your bloodstream. After the tracer has traveled to your heart, a scanning device is used to measure how much of the tracer is absorbed by or distributed to various areas of your heart. LET [facility] CARE PROVIDER KNOW ABOUT:  Any allergies you have.  All medicines you are taking, including vitamins, herbs, eye drops, creams, and over-the-counter medicines.  Previous problems you or members of your family have had with the use of anesthetics.  Any blood disorders you have.  Previous surgeries you have had.  Medical conditions you have.  RISKS AND COMPLICATIONS Generally, this is a safe procedure. However, as with any procedure, problems can occur. Possible problems include:   Serious chest pain.  Rapid heartbeat.  Sensation of warmth in your chest. This usually passes quickly. BEFORE THE PROCEDURE Ask your health care provider about changing or stopping your regular medicines. PROCEDURE This procedure is usually  done at a hospital and takes 2-4 hours.  An IV tube is inserted into one of your veins.  Your health care provider will inject a small amount of radioactive tracer through the tube.  You will then wait for 20-40 minutes while the tracer travels through your bloodstream.  You will lie down on an exam table so images of your heart can be taken. Images will be taken for about 15-20 minutes.  You will exercise on a treadmill or stationary bike. While you exercise, your heart activity will be monitored with an electrocardiogram (ECG), and your blood pressure will be checked.  If you are unable to exercise, you may be given a medicine to make your heart beat faster.  When blood flow to your heart has peaked, tracer will again be injected through the IV tube.  After 20-40 minutes, you will get back on the exam table and have more images taken of your heart.  When the procedure is over, your IV tube will be removed. AFTER THE PROCEDURE  You will likely be able to leave shortly after the test. Unless your health care provider tells you otherwise, you may return to your normal schedule, including diet, activities, and medicines.  Make sure you find out how and when you will get your test results. This information is not intended to replace advice given to you by your health care provider. Make sure you discuss any questions you have with your health care provider. Document Released: 01/19/2004 Document Revised: 12/29/2012 Document Reviewed: 12/02/2012 Elsevier Interactive Patient Education  2017 [employer].

## 2015-12-20 ENCOUNTER — Telehealth: Payer: Self-pay | Admitting: Cardiovascular Disease

## 2015-12-20 NOTE — Telephone Encounter (Signed)
Reviewed myoview instructions w/pt

## 2015-12-21 ENCOUNTER — Encounter
Admission: RE | Admit: 2015-12-21 | Discharge: 2015-12-21 | Disposition: A | Discharge status: Home / Self Care | Payer: Medicare Other | Source: Ambulatory Visit | Attending: Cardiovascular Disease | Admitting: Cardiovascular Disease

## 2015-12-21 DIAGNOSIS — R0602 Shortness of breath: Secondary | ICD-10-CM | POA: Insufficient documentation

## 2015-12-21 LAB — NM MYOCAR MULTI W/SPECT W/WALL MOTION / EF
CHL CUP NUCLEAR SDS: 1
CHL CUP NUCLEAR SRS: 7
CSEPPHR: 95 {beats}/min
LV dias vol: 92 mL (ref 62–150)
LVSYSVOL: 26 mL
Percent HR: 63 %
Rest HR: 53 {beats}/min
SSS: 2
TID: 1.04

## 2015-12-21 MED ORDER — REGADENOSON 0.4 MG/5ML IV SOLN
0.4000 mg | Freq: Once | INTRAVENOUS | Status: AC
  Administered 2015-12-21: 0.4 mg via INTRAVENOUS

## 2015-12-21 MED ORDER — TECHNETIUM TC 99M TETROFOSMIN IV KIT
32.6300 | PACK | Freq: Once | INTRAVENOUS | Status: AC | PRN
  Administered 2015-12-21: 32.63 via INTRAVENOUS

## 2015-12-21 MED ORDER — TECHNETIUM TC 99M TETROFOSMIN IV KIT
13.0000 | PACK | Freq: Once | INTRAVENOUS | Status: AC | PRN
Start: 2015-12-21 — End: 2015-12-21
  Administered 2015-12-21: 12.27 via INTRAVENOUS

## 2016-01-02 ENCOUNTER — Ambulatory Visit: Payer: Medicare Other | Admitting: Cardiovascular Disease

## 2016-03-11 ENCOUNTER — Telehealth: Payer: Self-pay | Admitting: Family Medicine

## 2016-03-11 NOTE — Telephone Encounter (Signed)
Left pt message asking to call Ebony Hail back directly at [phone] to schedule AWV. Thanks!  Physical scheduled 6/29 @ 130pm

## 2016-03-11 NOTE — Telephone Encounter (Signed)
Scheduled 07/15/16 °

## 2016-07-05 ENCOUNTER — Encounter: Payer: No Typology Code available for payment source | Admitting: Family Medicine

## 2016-07-15 ENCOUNTER — Ambulatory Visit (INDEPENDENT_AMBULATORY_CARE_PROVIDER_SITE_OTHER): Payer: Medicare Other

## 2016-07-15 VITALS — BP 118/64 | HR 68 | Temp 97.7°F | Resp 14 | Ht 71.0 in | Wt 157.8 lb

## 2016-07-15 DIAGNOSIS — Z0000 Encounter for general adult medical examination without abnormal findings: Secondary | ICD-10-CM

## 2016-07-15 NOTE — Patient Instructions (Addendum)
  Mr. Christian Hebert , Thank you for taking time to come for your Medicare Wellness Visit. I appreciate your ongoing commitment to your health goals. Please review the following plan we discussed and let me know if I can assist you in the future.   Follow up with Dr. Lacinda Axon as needed.    Bring a copy of your [place] and/or Living Will to be scanned into chart.  Have a great day!  These are the goals we discussed: Goals    . Increase water intake          Stay hydrated       This is a list of the screening recommended for you and due dates:  Health Maintenance  Topic Date Due  . Tetanus Vaccine  01/01/1964  . Colon Cancer Screening  01/08/2016  .  Hepatitis C: One time screening is recommended by Center for Disease Control  (CDC) for  adults born from [birth year] through 1966.   Completed  . Flu Shot  Excluded  . Pneumonia vaccines  Excluded

## 2016-07-15 NOTE — Progress Notes (Signed)
Subjective:   Christian Hebert is a 71 y.o. male who presents for an Initial Medicare Annual Wellness Visit.  Review of Systems  No ROS.  Medicare Wellness Visit. Additional risk factors are reflected in the social history.  Cardiac Risk Factors include: advanced age (>32men, >31 women);male gender    Objective:    Today's Vitals   07/15/16 1030  BP: 118/64  Pulse: 68  Resp: 14  Temp: 97.7 F (36.5 C)  TempSrc: Oral  SpO2: 95%  Weight: 157 lb 12.8 oz (71.6 kg)  Height: 5\' 11"  (1.803 m)   Body mass index is 22.01 kg/m.  Current Medications (verified) Outpatient Encounter Prescriptions as of 07/15/2016  Medication Sig  . aspirin EC 81 MG tablet Take 1 tablet (81 mg total) by mouth daily.  . Tdap (BOOSTRIX) 5-2.5-18.5 LF-MCG/0.5 injection Inject 0.5 mLs into the muscle once. (Patient not taking: Reported on 07/15/2016)   No facility-administered encounter medications on file as of 07/15/2016.     Allergies (verified) Oxycodone   History: Past Medical History:  Diagnosis Date  . GERD (gastroesophageal reflux disease)   . Personal history of tobacco use, presenting hazards to health 07/20/2015   Past Surgical History:  Procedure Laterality Date  . HERNIA REPAIR     bilateral inguinal   . TONSILLECTOMY     Family History  Problem Relation Age of Onset  . Stroke Mother   . Hypertension Mother   . Heart disease Father   . Hypertension Father    Social History   Occupational History  . Not on file.   Social History Main Topics  . Smoking status: Former Smoker    Packs/day: 1.00    Years: 50.00    Types: Cigarettes    Quit date: 01/07/2014  . Smokeless tobacco: Never Used  . Alcohol use No  . Drug use: No  . Sexual activity: Not Currently   Tobacco Counseling Counseling given: Not Answered   Activities of Daily Living In your present state of health, do you have any difficulty performing the following activities: 07/15/2016  Hearing? Y  Vision? N    Difficulty concentrating or making decisions? N  Walking or climbing stairs? Y  Dressing or bathing? N  Doing errands, shopping? N  Preparing Food and eating ? N  Using the Toilet? N  In the past six months, have you accidently leaked urine? N  Do you have problems with loss of bowel control? N  Managing your Medications? N  Managing your Finances? N  Housekeeping or managing your Housekeeping? N  Some recent data might be hidden    Immunizations and Health Maintenance  There is no immunization history on file for this patient. Health Maintenance Due  Topic Date Due  . TETANUS/TDAP  01/01/1964  . COLONOSCOPY  01/08/2016    Patient Care Team: Coral Spikes, DO as PCP - General (Family Medicine)  Indicate any recent Medical Services you may have received from other than [facility] providers in the past year (date may be approximate).    Assessment:   This is a routine wellness examination for Christian Hebert. The goal of the wellness visit is to assist the patient how to close the gaps in care and create a preventative care plan for the patient.   The roster of all physicians providing medical care to patient is listed in the Snapshot section of the chart.  Osteoporosis risk reviewed.    Safety issues reviewed; Smoke and carbon monoxide detectors in the  home. Firearms locked up in the home. Wears seatbelts when driving or riding with others. Patient does wear sunscreen or protective clothing when in direct sunlight. No violence in the home.  Depression- PHQ 2 &9 complete.  No signs/symptoms or verbal communication regarding little pleasure in doing things, feeling down, depressed or hopeless. No changes in sleeping, energy, eating, concentrating.  No thoughts of self harm or harm towards others.  Time spent on this topic is 8 minutes.   Patient is alert, normal appearance, oriented to person/place/and time.  Correctly identified the president of the [place], recall of 3/3 words, and  performing simple calculations. Displays appropriate judgement and can read correct time from watch face.   No new identified risk were noted.  No failures at ADL's or IADL's.    BMI- discussed the importance of a healthy diet, water intake and the benefits of aerobic exercise. Educational material provided.   24 hour diet recall: Breakfast: 1 Toast Lunch: Fish sandwich Dinner: [employer], 2 green vegetable  Daily fluid intake: 2 cups of caffeine,  6 cups of water  Dental- dentures.  Eye- Visual acuity not assessed per patient preference since they have regular follow up with the ophthalmologist.  Wears corrective lenses.  Sleep patterns- Sleeps 8 hours at night.  Wakes feeling rested.  Pneumococcal and Influenza excluded per patient preference due to the desire to never receive the vaccines.  Colonoscopy discussed, declined.  Cologuard discussed, educational information provided.  Patient Concerns:Weight loss over the last 12 months.  Deferred to PCP for follow.  Hearing/Vision screen Hearing Screening Comments: Followed by [facility]  Visits [facility] Hearing aid, bilateral Vision Screening Comments: Wears corrective lenses when reading Visual acuity not assessed per patient preference.     Dietary issues and exercise activities discussed: Current Exercise Habits: Home exercise routine, Type of exercise: walking, Time (Minutes): 20, Intensity: Moderate  Goals    . Increase water intake          Stay hydrated      Depression Screen PHQ 2/9 Scores 07/15/2016  PHQ - 2 Score 0    Fall Risk Fall Risk  07/15/2016  Falls in the past year? No    Cognitive Function: MMSE - Mini Mental State Exam 07/15/2016  Orientation to time 5  Orientation to Place 5  Registration 3  Attention/ Calculation 5  Recall 3  Language- name 2 objects 2  Language- repeat 1  Language- follow 3 step command 3  Language- read & follow direction 1  Write a sentence 1  Copy design 1  Total  score 30        Screening Tests Health Maintenance  Topic Date Due  . TETANUS/TDAP  01/01/1964  . COLONOSCOPY  01/08/2016  . Hepatitis C Screening  Completed  . INFLUENZA VACCINE  Excluded  . PNA vac Low Risk Adult  Excluded        Plan:    End of life planning; Advance aging; Advanced directives discussed. Copy of current HCPOA/Living Will requested.    I have personally reviewed and noted the following in the patient's chart:   . Medical and social history . Use of alcohol, tobacco or illicit drugs  . Current medications and supplements . Functional ability and status . Nutritional status . Physical activity . Advanced directives . List of other physicians . Hospitalizations, surgeries, and ER visits in previous 12 months . Vitals . Screenings to include cognitive, depression, and falls . Referrals and appointments  In addition,  I have reviewed and discussed with patient certain preventive protocols, quality metrics, and best practice recommendations. A written personalized care plan for preventive services as well as general preventive health recommendations were provided to patient.     Varney Biles, LPN   09/13/8862

## 2016-07-16 ENCOUNTER — Encounter: Payer: Self-pay | Admitting: Family Medicine

## 2016-07-16 ENCOUNTER — Ambulatory Visit (INDEPENDENT_AMBULATORY_CARE_PROVIDER_SITE_OTHER): Payer: Medicare Other | Admitting: Family Medicine

## 2016-07-16 VITALS — BP 110/70 | HR 67 | Temp 97.7°F | Resp 16 | Ht 70.0 in | Wt 156.0 lb

## 2016-07-16 DIAGNOSIS — Z125 Encounter for screening for malignant neoplasm of prostate: Secondary | ICD-10-CM

## 2016-07-16 DIAGNOSIS — Z0001 Encounter for general adult medical examination with abnormal findings: Secondary | ICD-10-CM | POA: Insufficient documentation

## 2016-07-16 DIAGNOSIS — R634 Abnormal weight loss: Secondary | ICD-10-CM

## 2016-07-16 DIAGNOSIS — E785 Hyperlipidemia, unspecified: Secondary | ICD-10-CM | POA: Diagnosis not present

## 2016-07-16 DIAGNOSIS — Z1211 Encounter for screening for malignant neoplasm of colon: Secondary | ICD-10-CM | POA: Diagnosis not present

## 2016-07-16 DIAGNOSIS — R7303 Prediabetes: Secondary | ICD-10-CM | POA: Insufficient documentation

## 2016-07-16 LAB — LIPID PANEL
CHOL/HDL RATIO: 6
Cholesterol: 162 mg/dL (ref 0–200)
HDL: 26.5 mg/dL — AB (ref >39.00)
LDL Cholesterol: 101 mg/dL — ABNORMAL HIGH (ref 0–99)
NonHDL: 135.43
TRIGLYCERIDES: 173 mg/dL — AB (ref 0.0–149.0)
VLDL: 34.6 mg/dL (ref 0.0–40.0)

## 2016-07-16 LAB — CBC
HEMATOCRIT: 46.5 % (ref 39.0–52.0)
HEMOGLOBIN: 15.4 g/dL (ref 13.0–17.0)
MCHC: 33.2 g/dL (ref 30.0–36.0)
MCV: 90.5 fl (ref 78.0–100.0)
Platelets: 366 10*3/uL (ref 150.0–400.0)
RBC: 5.14 Mil/uL (ref 4.22–5.81)
RDW: 13.8 % (ref 11.5–15.5)
WBC: 16.3 10*3/uL — ABNORMAL HIGH (ref 4.0–10.5)

## 2016-07-16 LAB — COMPREHENSIVE METABOLIC PANEL
ALT: 11 U/L (ref 0–53)
AST: 11 U/L (ref 0–37)
Albumin: 4.1 g/dL (ref 3.5–5.2)
Alkaline Phosphatase: 77 U/L (ref 39–117)
BILIRUBIN TOTAL: 0.7 mg/dL (ref 0.2–1.2)
BUN: 14 mg/dL (ref 6–23)
CALCIUM: 9.6 mg/dL (ref 8.4–10.5)
CO2: 25 meq/L (ref 19–32)
Chloride: 106 mEq/L (ref 96–112)
Creatinine, Ser: 1.24 mg/dL (ref 0.40–1.50)
GFR: 60.99 mL/min (ref >60.00)
Glucose, Bld: 97 mg/dL (ref 70–99)
Potassium: 4.8 mEq/L (ref 3.5–5.1)
Sodium: 138 mEq/L (ref 135–145)
Total Protein: 7.4 g/dL (ref 6.0–8.3)

## 2016-07-16 LAB — PSA: PSA: 1.37 ng/mL (ref 0.10–4.00)

## 2016-07-16 LAB — HEMOGLOBIN A1C: Hgb A1c MFr Bld: 6 % (ref 4.6–6.5)

## 2016-07-16 LAB — TSH: TSH: 1.73 u[IU]/mL (ref 0.35–4.50)

## 2016-07-16 NOTE — Patient Instructions (Signed)
We will set up cologuard and CT.  We will call with lab results.  3 meals a day.  Follow up in 3 months (you can cancel if your weight is stable)  Take care  Dr. Lacinda Axon

## 2016-07-16 NOTE — Progress Notes (Signed)
Subjective:  Patient ID: Christian Hebert, male    DOB: [birth year]  Age: 71 y.o. MRN: [id number]  CC: Physical  HPI:  71 year old Male with aortic atherosclerosis, coronary artery Disease, COPD, hyperlipidemia presents for an annual physical.  Preventative Healthcare  Colonoscopy: Declines. Okay with cologuard.  Immunizations - Declines immunizations.  Prostate cancer screening: PSA today.  Hepatitis C screening - Done.   Labs: Labs today.  Exercise: Active elderly gentleman.  Alcohol use: No.  Smoking/tobacco use: Former.  PMH, Surgical Hx, Family Hx, Social History reviewed and updated as below.  Past Medical History:  Diagnosis Date  . GERD (gastroesophageal reflux disease)   . Personal history of tobacco use, presenting hazards to health 07/20/2015   Past Surgical History:  Procedure Laterality Date  . HERNIA REPAIR     bilateral inguinal   . TONSILLECTOMY     Family History  Problem Relation Age of Onset  . Stroke Mother   . Hypertension Mother   . Heart disease Father   . Hypertension Father    Social History  Substance Use Topics  . Smoking status: Former Smoker    Packs/day: 1.00    Years: 50.00    Types: Cigarettes    Quit date: 01/07/2014  . Smokeless tobacco: Never Used  . Alcohol use No   Review of Systems  Constitutional: Positive for unexpected weight change.  All other systems reviewed and are negative.  Objective:  BP 110/70   Pulse 67   Temp 97.7 F (36.5 C) (Oral)   Resp 16   Ht 5\' 10"  (1.778 m)   Wt 156 lb (70.8 kg)   SpO2 97%   BMI 22.38 kg/m   BP/Weight 07/16/2016 07/15/2016 40/09/8117  Systolic BP 147 829 562  Diastolic BP 70 64 72  Wt. (Lbs) 156 157.8 163.25  BMI 22.38 22.01 22.77   Physical Exam  Constitutional: He is oriented to person, place, and time. He appears well-developed and well-nourished. No distress.  HENT:  Head: Normocephalic and atraumatic.  Nose: Nose normal.  Mouth/Throat: Oropharynx is clear and  moist. No oropharyngeal exudate.  Eyes: Conjunctivae are normal. No scleral icterus.  Neck: Neck supple.  Cardiovascular: Normal rate and regular rhythm.   No murmur heard. Pulmonary/Chest: Effort normal and breath sounds normal. He has no wheezes. He has no rales.  Abdominal: Soft. He exhibits no distension. There is no tenderness. There is no rebound and no guarding.  Musculoskeletal: Normal range of motion. He exhibits no edema.  Lymphadenopathy:    He has no cervical adenopathy.  Neurological: He is alert and oriented to person, place, and time.  Skin: Skin is warm and dry. No rash noted.  Psychiatric: He has a normal mood and affect.  Vitals reviewed.  Lab Results  Component Value Date   WBC 12.8 (H) 07/05/2015   HGB 16.0 07/05/2015   HCT 47.7 07/05/2015   PLT 344.0 07/05/2015   GLUCOSE 96 07/05/2015   CHOL 155 07/05/2015   TRIG 156.0 (H) 07/05/2015   HDL 24.90 (L) 07/05/2015   LDLCALC 98 07/05/2015   ALT 13 07/05/2015   AST 14 07/05/2015   NA 136 07/05/2015   K 4.2 07/05/2015   CL 104 07/05/2015   CREATININE 1.22 07/05/2015   BUN 15 07/05/2015   CO2 25 07/05/2015   HGBA1C 5.9 07/05/2015    Assessment & Plan:   Problem List Items Addressed This Visit      Other   Encounter for health  maintenance examination with abnormal findings - Primary    Patient endorsing unintentional weight loss. He's lost 11 pounds over the past year. Arranging:. Declined immunizations. Labs today. Remainder of preventative healthcare up-to-date.      Hyperlipidemia   Relevant Orders   Lipid panel   Prediabetes   Relevant Orders   Hemoglobin A1c    Other Visit Diagnoses    Unintentional weight loss       Relevant Orders   CBC   Comprehensive metabolic panel   PSA   TSH   Prostate cancer screening       Relevant Orders   PSA   Colon cancer screening       Relevant Orders   Cologuard     Follow-up: 3 months  [place]

## 2016-07-16 NOTE — Assessment & Plan Note (Signed)
Patient endorsing unintentional weight loss. He's lost 11 pounds over the past year. Arranging:. Declined immunizations. Labs today. Remainder of preventative healthcare up-to-date.

## 2016-07-17 ENCOUNTER — Telehealth: Payer: Self-pay | Admitting: *Deleted

## 2016-07-17 NOTE — Telephone Encounter (Signed)
Left message for patient to notify them that it is time to schedule annual low dose lung cancer screening CT scan. Instructed patient to call back to verify information prior to the scan being scheduled.  

## 2016-07-20 ENCOUNTER — Encounter: Payer: Self-pay | Admitting: Emergency Medicine

## 2016-07-20 ENCOUNTER — Emergency Department: Payer: Medicare Other

## 2016-07-20 ENCOUNTER — Emergency Department
Admission: EM | Admit: 2016-07-20 | Discharge: 2016-07-20 | Disposition: A | Discharge status: Home / Self Care | Payer: Medicare Other | Attending: Surgery | Admitting: Surgery

## 2016-07-20 DIAGNOSIS — R1031 Right lower quadrant pain: Secondary | ICD-10-CM | POA: Diagnosis not present

## 2016-07-20 DIAGNOSIS — R109 Unspecified abdominal pain: Secondary | ICD-10-CM | POA: Diagnosis not present

## 2016-07-20 DIAGNOSIS — J449 Chronic obstructive pulmonary disease, unspecified: Secondary | ICD-10-CM | POA: Diagnosis not present

## 2016-07-20 DIAGNOSIS — Z87891 Personal history of nicotine dependence: Secondary | ICD-10-CM | POA: Insufficient documentation

## 2016-07-20 DIAGNOSIS — K4090 Unilateral inguinal hernia, without obstruction or gangrene, not specified as recurrent: Secondary | ICD-10-CM | POA: Diagnosis not present

## 2016-07-20 DIAGNOSIS — R001 Bradycardia, unspecified: Secondary | ICD-10-CM | POA: Insufficient documentation

## 2016-07-20 DIAGNOSIS — N281 Cyst of kidney, acquired: Secondary | ICD-10-CM | POA: Diagnosis not present

## 2016-07-20 DIAGNOSIS — R1032 Left lower quadrant pain: Secondary | ICD-10-CM | POA: Diagnosis not present

## 2016-07-20 LAB — COMPREHENSIVE METABOLIC PANEL
ALBUMIN: 3.9 g/dL (ref 3.5–5.0)
ALT: 14 U/L — ABNORMAL LOW (ref 17–63)
AST: 19 U/L (ref 15–41)
Alkaline Phosphatase: 64 U/L (ref 38–126)
Anion gap: 8 (ref 5–15)
BUN: 16 mg/dL (ref 6–20)
CHLORIDE: 103 mmol/L (ref 101–111)
CO2: 25 mmol/L (ref 22–32)
CREATININE: 1.31 mg/dL — AB (ref 0.61–1.24)
Calcium: 9.3 mg/dL (ref 8.9–10.3)
GFR calc Af Amer: >60 mL/min (ref >60)
GFR, EST NON AFRICAN AMERICAN: 53 mL/min — AB (ref >60)
GLUCOSE: 103 mg/dL — AB (ref 65–99)
POTASSIUM: 4.3 mmol/L (ref 3.5–5.1)
Sodium: 136 mmol/L (ref 135–145)
Total Bilirubin: 0.9 mg/dL (ref 0.3–1.2)
Total Protein: 7.4 g/dL (ref 6.5–8.1)

## 2016-07-20 LAB — CBC
HEMATOCRIT: 44.8 % (ref 40.0–52.0)
Hemoglobin: 15.1 g/dL (ref 13.0–18.0)
MCH: 30.2 pg (ref 26.0–34.0)
MCHC: 33.6 g/dL (ref 32.0–36.0)
MCV: 89.9 fL (ref 80.0–100.0)
PLATELETS: 327 10*3/uL (ref 150–440)
RBC: 4.99 MIL/uL (ref 4.40–5.90)
RDW: 13.6 % (ref 11.5–14.5)
WBC: 17.3 10*3/uL — ABNORMAL HIGH (ref 3.8–10.6)

## 2016-07-20 LAB — TYPE AND SCREEN
ABO/RH(D): A POS
ANTIBODY SCREEN: NEGATIVE

## 2016-07-20 LAB — TROPONIN I: Troponin I: <0.03 ng/mL (ref <0.03)

## 2016-07-20 LAB — LIPASE, BLOOD: LIPASE: 31 U/L (ref 11–51)

## 2016-07-20 MED ORDER — IOPAMIDOL (ISOVUE-300) INJECTION 61%
100.0000 mL | Freq: Once | INTRAVENOUS | Status: AC | PRN
  Administered 2016-07-20: 100 mL via INTRAVENOUS

## 2016-07-20 MED ORDER — SODIUM CHLORIDE 0.9 % IV SOLN: 1.0000 g | Freq: Once | INTRAVENOUS | Status: DC

## 2016-07-20 MED ORDER — SODIUM CHLORIDE 0.9 % IV SOLN
1.0000 g | Freq: Once | INTRAVENOUS | Status: AC
  Administered 2016-07-20: 1 g via INTRAVENOUS
  Filled 2016-07-20: qty 10

## 2016-07-20 MED ORDER — ONDANSETRON HCL 4 MG PO TABS: 4.0000 mg | ORAL_TABLET | Freq: Every day | ORAL | 0 refills | Status: DC | PRN

## 2016-07-20 MED ORDER — MORPHINE SULFATE (PF) 4 MG/ML IV SOLN
4.0000 mg | INTRAVENOUS | Status: DC | PRN
  Administered 2016-07-20 (×2): 4 mg via INTRAVENOUS
  Filled 2016-07-20 (×2): qty 1

## 2016-07-20 MED ORDER — CALCIUM GLUCONATE 10 % IV SOLN: 1.0000 g | Freq: Once | INTRAVENOUS | Status: DC

## 2016-07-20 MED ORDER — SODIUM CHLORIDE 0.9 % IV BOLUS (SEPSIS)
1000.0000 mL | Freq: Once | INTRAVENOUS | Status: AC
  Administered 2016-07-20: 1000 mL via INTRAVENOUS

## 2016-07-20 MED ORDER — HYDROCODONE-ACETAMINOPHEN 5-325 MG PO TABS: 1.0000 | ORAL_TABLET | ORAL | 0 refills | Status: DC | PRN

## 2016-07-20 MED ORDER — ONDANSETRON HCL 4 MG/2ML IJ SOLN
4.0000 mg | Freq: Once | INTRAMUSCULAR | Status: AC
  Administered 2016-07-20: 4 mg via INTRAVENOUS
  Filled 2016-07-20: qty 2

## 2016-07-20 MED ORDER — ATROPINE SULFATE 1 MG/10ML IJ SOSY
PREFILLED_SYRINGE | INTRAMUSCULAR | Status: AC
  Filled 2016-07-20: qty 20

## 2016-07-20 MED ORDER — POLYETHYLENE GLYCOL 3350 17 G PO PACK: 17.0000 g | PACK | Freq: Every day | ORAL | 0 refills | Status: DC

## 2016-07-20 MED ORDER — FENTANYL CITRATE (PF) 100 MCG/2ML IJ SOLN: 100.0000 ug | INTRAMUSCULAR | Status: DC | PRN

## 2016-07-20 NOTE — ED Notes (Signed)
MD notified patient's HR noted to be 39. Pt moved to room 11, this RN and MD to bedside at this time. This RN brought [place] machine to bedside per MD request.

## 2016-07-20 NOTE — ED Notes (Signed)
Pt returned from CT at this time.  

## 2016-07-20 NOTE — Consult Note (Signed)
Surgical Consultation  07/20/2016  Christian Hebert is an 71 y.o. male.   CC: Abdominal pain  HPI: This patient with acute abdominal pain that started this morning. He's had this happen twice before and it spontaneously resolved. He thought that he had a bulge in his left groin. Attempts at reducing a known recurrent inguinal hernia on the left by Dr. Alford Highland in the ER was unsuccessful per Dr. Trellis Moment report to me by telephone. Patient states that his pain is gone and his bulge is gone and he has no more discomfort.  He did vomit 3 times today but is no longer nauseated he was passing gas and had a normal bowel movement this morning but none since. He's had no fevers or chills and again his pain is completely resolved at this point.  Past Medical History:  Diagnosis Date  . GERD (gastroesophageal reflux disease)   . Personal history of tobacco use, presenting hazards to health 07/20/2015    Past Surgical History:  Procedure Laterality Date  . HERNIA REPAIR     bilateral inguinal   . TONSILLECTOMY      Family History  Problem Relation Age of Onset  . Stroke Mother   . Hypertension Mother   . Heart disease Father   . Hypertension Father     Social History:  reports that he quit smoking about 2 years ago. His smoking use included Cigarettes. He has a 50.00 pack-year smoking history. He has never used smokeless tobacco. He reports that he does not drink alcohol or use drugs. 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 Allergies:  Allergies  Allergen Reactions  . Oxycodone Nausea And Vomiting    Medications reviewed.   Review of Systems:   Review of Systems  Constitutional: Negative.   HENT: Negative.   Eyes: Negative.   Respiratory: Negative.   Cardiovascular: Negative.   Gastrointestinal: Positive for abdominal  pain, nausea and vomiting. Negative for blood in stool, constipation, diarrhea and heartburn.  Genitourinary: Negative.   Musculoskeletal: Negative.   Skin: Negative.   Neurological: Negative.   Endo/Heme/Allergies: Negative.   Psychiatric/Behavioral: Negative.      Physical Exam:  BP 128/75   Pulse (!) 46   Temp 97.8 F (36.6 C) (Oral)   Resp 14   Ht 5' 10"  (1.778 m)   Wt 156 lb (70.8 kg)   SpO2 93%   BMI 22.38 kg/m   Physical Exam  Constitutional: He is oriented to person, place, and time and well-developed, well-nourished, and in no distress. No distress.  HENT:  Head: Normocephalic and atraumatic.  Eyes: Pupils are equal, round, and reactive to light. Right eye exhibits no discharge. Left eye exhibits no discharge. No scleral icterus.  Neck: Normal range of motion.  Cardiovascular: Normal rate, regular rhythm and normal heart sounds.   Pulmonary/Chest: Effort normal and breath sounds normal. No respiratory distress. He has no wheezes. He has no rales.  Abdominal: Soft. He exhibits no distension. There is no tenderness. There is no rebound and no guarding.  Scars in both groins signifying bilateral inguinal hernia repairs with via an open technique no palpable bulge in the left groin no tenderness in the left groin. Non-tympanitic  Genitourinary: Penis normal.  Musculoskeletal: Normal range of motion. He exhibits no edema or tenderness.  Lymphadenopathy:    He has no  cervical adenopathy.  Neurological: He is alert and oriented to person, place, and time.  Skin: Skin is warm and dry. No rash noted. He is not diaphoretic. No erythema.  Psychiatric: Mood and affect normal.  Vitals reviewed.     Results for orders placed or performed during the hospital encounter of 07/20/16 (from the past 48 hour(s))  Lipase, blood     Status: None   Collection Time: 07/20/16  3:21 PM  Result Value Ref Range   Lipase 31 11 - 51 U/L  Comprehensive metabolic panel     Status: Abnormal    Collection Time: 07/20/16  3:21 PM  Result Value Ref Range   Sodium 136 135 - 145 mmol/L   Potassium 4.3 3.5 - 5.1 mmol/L   Chloride 103 101 - 111 mmol/L   CO2 25 22 - 32 mmol/L   Glucose, Bld 103 (H) 65 - 99 mg/dL   BUN 16 6 - 20 mg/dL   Creatinine, Ser 1.31 (H) 0.61 - 1.24 mg/dL   Calcium 9.3 8.9 - 10.3 mg/dL   Total Protein 7.4 6.5 - 8.1 g/dL   Albumin 3.9 3.5 - 5.0 g/dL   AST 19 15 - 41 U/L   ALT 14 (L) 17 - 63 U/L   Alkaline Phosphatase 64 38 - 126 U/L   Total Bilirubin 0.9 0.3 - 1.2 mg/dL   GFR calc non Af Amer 53 (L) >60 mL/min   GFR calc Af Amer >60 >60 mL/min    Comment: (NOTE) The eGFR has been calculated using the CKD EPI equation. This calculation has not been validated in all clinical situations. eGFR's persistently <60 mL/min signify possible Chronic Kidney Disease.    Anion gap 8 5 - 15  CBC     Status: Abnormal   Collection Time: 07/20/16  3:21 PM  Result Value Ref Range   WBC 17.3 (H) 3.8 - 10.6 K/uL   RBC 4.99 4.40 - 5.90 MIL/uL   Hemoglobin 15.1 13.0 - 18.0 g/dL   HCT 44.8 40.0 - 52.0 %   MCV 89.9 80.0 - 100.0 fL   MCH 30.2 26.0 - 34.0 pg   MCHC 33.6 32.0 - 36.0 g/dL   RDW 13.6 11.5 - 14.5 %   Platelets 327 150 - 440 K/uL  Troponin I     Status: None   Collection Time: 07/20/16  3:25 PM  Result Value Ref Range   Troponin I <0.03 <0.03 ng/mL  Type and screen [place]     Status: None   Collection Time: 07/20/16  3:31 PM  Result Value Ref Range   ABO/RH(D) A POS    Antibody Screen NEG    Sample Expiration 07/23/2016    Ct Abdomen Pelvis W Contrast  Result Date: 07/20/2016 CLINICAL DATA:  Acute right lower quadrant pain. EXAM: CT ABDOMEN AND PELVIS WITH CONTRAST TECHNIQUE: Multidetector CT imaging of the abdomen and pelvis was performed using the standard protocol following bolus administration of intravenous contrast. CONTRAST:  134m ISOVUE-300 IOPAMIDOL (ISOVUE-300) INJECTION 61% COMPARISON:  Aortic ultrasound 10/20/2015  FINDINGS: Lower chest: Emphysema. Minimal dependent atelectasis. Small hiatal hernia with fluid in the distal esophagus. Hepatobiliary: 3.1 cm cyst the left hepatic lobe. Tiny low-density lesion in the right hepatic dome is too small to characterize. Gallbladder physiologically distended, no calcified stone. No biliary dilatation. Pancreas: No ductal dilatation or inflammation. Spleen: Normal in size without focal abnormality. Adrenals/Urinary Tract: Normal adrenal glands. Homogeneous renal enhancement with symmetric renal excretion on delayed phase  imaging. Simple cyst in the mid right kidney measures 16 mm. Additional smaller cortical cyst in the lower right kidney. Ureters are decompressed. Urinary bladder is physiologically distended, no bladder wall thickening. Stomach/Bowel: Small hiatal hernia containing fluid. Stomach mildly distended containing fluid. Proximal small bowel is decompressed, more distal small bowel is fluid-filled and prominent in caliber. There is a left inguinal hernia containing dilated small bowel, the exiting small bowel is decompressed. Minimal edema in the hernia sac. No evidence appendicitis, appendix tentatively identified and normal. No right lower quadrant inflammation. Moderate colonic stool burden with tortuosity. Sigmoid colonic diverticulosis. Mild mural wall thickening with sigmoid colon. No discrete pericolonic inflammation. Vascular/Lymphatic: There is ectasia of the infrarenal abdominal aorta maximal dimension 2.6 x 2.6 cm. There is calcified and noncalcified plaque with lateral a centric mural thrombus in the infrarenal portion. No aneurysm. Moderate atherosclerosis throughout the iliac vessels. No abdominal or pelvic adenopathy. Reproductive: Enlarged prostate gland spanning 5.9 cm. Other: No ascites, free air or perforation. Musculoskeletal: There are no acute or suspicious osseous abnormalities. IMPRESSION: 1. Left inguinal hernia contains fluid-filled small bowel loops  with surrounding inflammation, with early developing small bowel obstruction. No perforation or abscess. 2. Sigmoid colonic diverticulosis. Mural thickening of sigmoid colon is likely related to chronic inflammation, recommend up-to-date colonoscopy to exclude neoplasm as cause of colonic wall thickening. No acute pericolonic inflammation. 3. Atherosclerotic ectatic abdominal aorta without aneurysm maximal dimension 2.6 cm, at risk for aneurysm development. Recommend followup by ultrasound in 5 years. This recommendation follows ACR consensus guidelines: White Paper of the ACR Incidental Findings Committee II on Vascular Findings. J Am Coll Radiol 2013; 10:789-794. 4. Hepatic and right renal cysts.  Small hiatal hernia. Electronically Signed   By: Jeb Levering M.D.   On: 07/20/2016 18:07    Assessment/Plan:  This a patient with a history of bilateral inguinal hernia repairs 15 years ago in [place]. They were done at the same time and he had considerable pain after that procedure. Today he had any acute onset of abdominal pain this is the third episode of such. His pain is completely gone at this point after Dr. Alford Highland attempted reduction. He had not thought that he was successful but apparently was. Currently the pain is gone the patient is pain-free and has no bulge in his groin.  I discussed with the patient the options of bringing him in the hospital in repairing this a semi-electively tomorrow however I did offer a possible laparoscopic approach in the future after seeing him in the office and this is what he chose. He wants be discharged at this time I spoke to Dr. Alford Highland who will give him some narcotics if he needs it for minimal testicular pain at this point. Patient will follow-up in my office this week.  Florene Glen, MD, FACS

## 2016-07-20 NOTE — ED Provider Notes (Signed)
[facility] Emergency Department Provider Note    First MD Initiated Contact with Patient 07/20/16 1458     (approximate)  I have reviewed the triage vital signs and the nursing notes.   HISTORY  Chief Complaint Abdominal Pain    HPI Christian Hebert is a 71 y.o. male resistance with a diffuse abdominal pain primarily right lower quadrant associated with nausea and vomiting starting today. Has had intermittent pain like this before. States the pain radiated up through to his back and chest. Patient arrives to the ER with mild bradycardia but is normotensive. Denies any shortness of breath. No syncopal episodes. Denies any previous history of heart disease. States he was seen by his PCP earlier this week and had elevated white count but no fevers and continue to follow this.   Past Medical History:  Diagnosis Date  . GERD (gastroesophageal reflux disease)   . Personal history of tobacco use, presenting hazards to health 07/20/2015   Family History  Problem Relation Age of Onset  . Stroke Mother   . Hypertension Mother   . Heart disease Father   . Hypertension Father    Past Surgical History:  Procedure Laterality Date  . HERNIA REPAIR     bilateral inguinal   . TONSILLECTOMY     Patient Active Problem List   Diagnosis Date Noted  . Acute abdominal pain   . Left inguinal hernia   . Prediabetes 07/16/2016  . Hyperlipidemia 07/16/2016  . Encounter for health maintenance examination with abnormal findings 07/16/2016  . COPD (chronic obstructive pulmonary disease) ([place]) 10/13/2015  . Aortic atherosclerosis ([place]) 10/13/2015  . Coronary artery calcification 10/13/2015  . Hearing loss 07/05/2015  . Former smoker 07/05/2015      Prior to Admission medications   Medication Sig Start Date End Date Taking? Authorizing Provider  HYDROcodone-acetaminophen (NORCO) 5-325 MG tablet Take 1 tablet by mouth every 4 (four) hours as needed for moderate pain.  07/20/16   Merlyn Lot, MD  ondansetron (ZOFRAN) 4 MG tablet Take 1 tablet (4 mg total) by mouth daily as needed for nausea or vomiting. 07/20/16 07/20/17  Merlyn Lot, MD  polyethylene glycol (MIRALAX / Floria Raveling) packet Take 17 g by mouth daily. Mix one tablespoon with 8oz of your favorite juice or water every day until you are having soft formed stools. Then start taking once daily if you didn't have a stool the day before. 07/20/16   Merlyn Lot, MD    Allergies Oxycodone    Social History Social History  Substance Use Topics  . Smoking status: Former Smoker    Packs/day: 1.00    Years: 50.00    Types: Cigarettes    Quit date: 01/07/2014  . Smokeless tobacco: Never Used  . Alcohol use No    Review of Systems Patient denies headaches, rhinorrhea, blurry vision, numbness, shortness of breath, chest pain, edema, cough, abdominal pain, nausea, vomiting, diarrhea, dysuria, fevers, rashes or hallucinations unless otherwise stated above in HPI. ____________________________________________   PHYSICAL EXAM:  VITAL SIGNS: Vitals:   07/20/16 2030 07/20/16 2100  BP: 136/84 131/85  Pulse: (!) 42   Resp: 17 16  Temp:      Constitutional: Alert and oriented. Ill appearing Eyes: Conjunctivae are normal.  Head: Atraumatic. Nose: No congestion/rhinnorhea. Mouth/Throat: Mucous membranes are moist.   Neck: No stridor. Painless ROM.  Cardiovascular: bradycardic rate, regular rhythm. Grossly normal heart sounds.  Good peripheral circulation. Respiratory: Normal respiratory effort.  No retractions. Lungs CTAB.  Gastrointestinal: Soft but with + rlq ttp and guarding. No distention. No abdominal bruits. No CVA tenderness.  Left inguinal hernia without overlying erythema or warmth Musculoskeletal: No lower extremity tenderness nor edema.  No joint effusions. Neurologic:  Normal speech and language. No gross focal neurologic deficits are appreciated. No facial droop Skin:  Skin is  warm, dry and intact. No rash noted. Psychiatric: Mood and affect are normal. Speech and behavior are normal.  ____________________________________________   LABS (all labs ordered are listed, but only abnormal results are displayed)  Results for orders placed or performed during the hospital encounter of 07/20/16 (from the past 24 hour(s))  Lipase, blood     Status: None   Collection Time: 07/20/16  3:21 PM  Result Value Ref Range   Lipase 31 11 - 51 U/L  Comprehensive metabolic panel     Status: Abnormal   Collection Time: 07/20/16  3:21 PM  Result Value Ref Range   Sodium 136 135 - 145 mmol/L   Potassium 4.3 3.5 - 5.1 mmol/L   Chloride 103 101 - 111 mmol/L   CO2 25 22 - 32 mmol/L   Glucose, Bld 103 (H) 65 - 99 mg/dL   BUN 16 6 - 20 mg/dL   Creatinine, Ser 1.31 (H) 0.61 - 1.24 mg/dL   Calcium 9.3 8.9 - 10.3 mg/dL   Total Protein 7.4 6.5 - 8.1 g/dL   Albumin 3.9 3.5 - 5.0 g/dL   AST 19 15 - 41 U/L   ALT 14 (L) 17 - 63 U/L   Alkaline Phosphatase 64 38 - 126 U/L   Total Bilirubin 0.9 0.3 - 1.2 mg/dL   GFR calc non Af Amer 53 (L) >60 mL/min   GFR calc Af Amer >60 >60 mL/min   Anion gap 8 5 - 15  CBC     Status: Abnormal   Collection Time: 07/20/16  3:21 PM  Result Value Ref Range   WBC 17.3 (H) 3.8 - 10.6 K/uL   RBC 4.99 4.40 - 5.90 MIL/uL   Hemoglobin 15.1 13.0 - 18.0 g/dL   HCT 44.8 40.0 - 52.0 %   MCV 89.9 80.0 - 100.0 fL   MCH 30.2 26.0 - 34.0 pg   MCHC 33.6 32.0 - 36.0 g/dL   RDW 13.6 11.5 - 14.5 %   Platelets 327 150 - 440 K/uL  Troponin I     Status: None   Collection Time: 07/20/16  3:25 PM  Result Value Ref Range   Troponin I <0.03 <0.03 ng/mL  Type and screen [facility]     Status: None   Collection Time: 07/20/16  3:31 PM  Result Value Ref Range   ABO/RH(D) A POS    Antibody Screen NEG    Sample Expiration 07/23/2016   Troponin I     Status: None   Collection Time: 07/20/16  8:42 PM  Result Value Ref Range   Troponin I <0.03  <0.03 ng/mL   ____________________________________________  EKG My review and personal interpretation at Time: 15:25   Indication: abdominal pain, bradycardia  Rate: 45  Rhythm: 1st degree av block Axis: normal Other: non sepcific st changes, borderline hyperacute t waves ____________________________________________  RADIOLOGY  I personally reviewed all radiographic images ordered to evaluate for the above acute complaints and reviewed radiology reports and findings.  These findings were personally discussed with the patient.  Please see medical record for radiology report.  ____________________________________________   PROCEDURES  Procedure(s) performed:  Procedures  Critical Care performed: no ____________________________________________   INITIAL IMPRESSION / ASSESSMENT AND PLAN / ED COURSE  Pertinent labs & imaging results that were available during my care of the patient were reviewed by me and considered in my medical decision making (see chart for details).  DDX: ppendicitis, stone, AAA< perf, hernia, sbo, acs  Christian Hebert is a 71 y.o. who presents to the ED with abdominal pain as described above. Patient with evidence of bradycardia as well but is normotensive and otherwise well-perfused. Does have mild tenderness on exam and based on his white count presentation will order CT imaging to further evaluate for any evidence of acute appendicitis.  EKG does show sinus bradycardia without any evidence of EKG changes. There are some borderline hyperacute T waves but no evidence of hyperkalemia. The patient's symptoms seem most consistent with ACS or cardiac etiology. He has good pulses throughout. Does not seem clinically consistent with dissection.  Clinical Course as of Jul 20 2132  Sat Jul 20, 2016  1905 Discussed CT imaging results with patient. After receiving 8 mg of IV morphine I attempted reduction of the left inguinal hernia but was unsuccessful. I have paged  Dr. Burt Knack of general surgery for his evaluation.  [PR]    Clinical Course User Index [PR] Merlyn Lot, MD  ----------------------------------------- 9:33 PM on 07/20/2016 -----------------------------------------  Patient has been evaluated by Dr. Burt Knack of general surgery at bedside. It seems that after receiving my attempted reduction and IV pain medication that his hernia has been reduced. He is tolerating oral hydration. States the pain has resolved. Denies any chest pain. Repeat troponin is negative. Patient able to ambulate with a steady gait without any weakness or syncope. Patient has been offered admission to hospital for operative repair of his hernia further hemodynamic monitoring but the patient has declined this. As he remains stable and is clinically improved after this is a reasonable option as he is established with a cardiologist and will have outpatient follow-up with Dr. Burt Knack.  Patient was able to tolerate PO and was able to ambulate with a steady gait.  Have discussed with the patient and available family all diagnostics and treatments performed thus far and all questions were answered to the best of my ability. The patient demonstrates understanding and agreement with plan.    ____________________________________________   FINAL CLINICAL IMPRESSION(S) / ED DIAGNOSES  Final diagnoses:  Left inguinal hernia  Acute abdominal pain  Bradycardia      NEW MEDICATIONS STARTED DURING THIS VISIT:  New Prescriptions   HYDROCODONE-ACETAMINOPHEN (NORCO) 5-325 MG TABLET    Take 1 tablet by mouth every 4 (four) hours as needed for moderate pain.   ONDANSETRON (ZOFRAN) 4 MG TABLET    Take 1 tablet (4 mg total) by mouth daily as needed for nausea or vomiting.   POLYETHYLENE GLYCOL (MIRALAX / GLYCOLAX) PACKET    Take 17 g by mouth daily. Mix one tablespoon with 8oz of your favorite juice or water every day until you are having soft formed stools. Then start taking once daily  if you didn't have a stool the day before.     Note:  This document was prepared using Dragon voice recognition software and may include unintentional dictation errors.    Merlyn Lot, MD 07/20/16 2135

## 2016-07-20 NOTE — ED Notes (Signed)
Pt taken to CT at this time.

## 2016-07-20 NOTE — ED Triage Notes (Signed)
Pt presents to ED via [facility] with c/o umbilical abdominal pain that radiates into his chest that started today. EMS reports 3 episodes of vomiting, denies any blood. EMS also reports HR 47, states this is not patient's baseline and patient does not take beta blockers. Pt is alert and oriented on arrival.

## 2016-07-20 NOTE — ED Notes (Signed)
Troponin drawn. Pt given ginger ale, gr crackers and p.butter.

## 2016-07-22 ENCOUNTER — Telehealth: Payer: Self-pay | Admitting: *Deleted

## 2016-07-22 NOTE — Telephone Encounter (Signed)
Notified patient that annual lung cancer screening low dose CT scan is due currently or will be in near future. Patient requests to wait 2-3 weeks due to upcoming hernia surgery that he does not know the date of yet.

## 2016-07-23 DIAGNOSIS — M1710 Unilateral primary osteoarthritis, unspecified knee: Secondary | ICD-10-CM | POA: Insufficient documentation

## 2016-07-24 ENCOUNTER — Telehealth: Payer: Self-pay | Admitting: Radiology

## 2016-07-24 ENCOUNTER — Ambulatory Visit (INDEPENDENT_AMBULATORY_CARE_PROVIDER_SITE_OTHER): Payer: Medicare Other | Admitting: Physician Assistant

## 2016-07-24 ENCOUNTER — Other Ambulatory Visit: Payer: Self-pay | Admitting: Family Medicine

## 2016-07-24 ENCOUNTER — Encounter: Payer: Self-pay | Admitting: Physician Assistant

## 2016-07-24 ENCOUNTER — Encounter (INDEPENDENT_AMBULATORY_CARE_PROVIDER_SITE_OTHER): Payer: Medicare Other

## 2016-07-24 VITALS — BP 120/62 | HR 60 | Ht 70.5 in | Wt 157.5 lb

## 2016-07-24 DIAGNOSIS — I2584 Coronary atherosclerosis due to calcified coronary lesion: Secondary | ICD-10-CM

## 2016-07-24 DIAGNOSIS — R001 Bradycardia, unspecified: Secondary | ICD-10-CM | POA: Diagnosis not present

## 2016-07-24 DIAGNOSIS — I2510 Atherosclerotic heart disease of native coronary artery without angina pectoris: Secondary | ICD-10-CM

## 2016-07-24 DIAGNOSIS — Z01810 Encounter for preprocedural cardiovascular examination: Secondary | ICD-10-CM

## 2016-07-24 DIAGNOSIS — E782 Mixed hyperlipidemia: Secondary | ICD-10-CM

## 2016-07-24 NOTE — Patient Instructions (Addendum)
Labwork: Your physician recommends that you return for lab work when you go for your stress test. Just take envelope and let them know you need to get labs also. Go to [employer] and check in at the front desk.    Testing/Procedures: Your physician has recommended that you wear a holter monitor. Holter monitors are medical devices that record the heart's electrical activity. Doctors most often use these monitors to diagnose arrhythmias. Arrhythmias are problems with the speed or rhythm of the heartbeat. The monitor is a small, portable device. You can wear one while you do your normal daily activities. This is usually used to diagnose what is causing palpitations/syncope (passing out).  [place]  Your caregiver has ordered a Stress Test with nuclear imaging. The purpose of this test is to evaluate the blood supply to your heart muscle. This procedure is referred to as a "Non-Invasive Stress Test." This is because other than having an IV started in your vein, nothing is inserted or "invades" your body. Cardiac stress tests are done to find areas of poor blood flow to the heart by determining the extent of coronary artery disease (CAD).   Please note: these test may take anywhere between 2-4 hours to complete  PLEASE REPORT TO [place] AT THE FIRST DESK WILL DIRECT YOU WHERE TO GO  Date of Procedure:_Friday July 20, 2018__  Arrival Time for Procedure:__Arrive at 07:15 AM___   PLEASE NOTIFY THE OFFICE AT LEAST 24 HOURS IN ADVANCE IF YOU ARE UNABLE TO [place].  [phone] AND  PLEASE NOTIFY NUCLEAR MEDICINE AT [facility] AT LEAST 24 HOURS IN ADVANCE IF YOU ARE UNABLE TO KEEP YOUR APPOINTMENT. [phone]  How to prepare for your [facility] test:  1. Do not eat or drink after midnight 2. No caffeine for 24 hours prior to test 3. No smoking 24 hours prior to test. 4. Your medication may be taken with water.  If your doctor stopped a  medication because of this test, do not take that medication. 5. Ladies, please do not wear dresses.  Skirts or pants are appropriate. Please wear a short sleeve shirt. 6. No perfume, cologne or lotion. 7. Wear comfortable walking shoes. No heels!   Follow-Up: Your physician recommends that you schedule a follow-up appointment depending on results.  Your physician recommends that you schedule a follow-up appointment in: 3 months with Dr. Fletcher Anon.   It was a pleasure seeing you today here in the office. Please do not hesitate to give [place] a call back if you have any further questions. Flordell Hills, BSN

## 2016-07-24 NOTE — Progress Notes (Signed)
Cardiology Office Note Date:  07/24/2016  Patient ID:  Christian Hebert, Christian Hebert 1945-05-26, MRN [id number] PCP:  Coral Spikes, DO  Cardiologist:  Dr. Fletcher Anon, MD    Chief Complaint: ED follow up  History of Present Illness: Christian Hebert is a 71 y.o. male with history of coronary and aortic atherosclerosis noted on screening CT for lung cancer. Also with a history of COPD 2/2 tobacco abuse at 1.5 packs daily x 50 years quitting a few years prior with ongoing occasional cigar smoking, GERD, and left inguinal hernia s/p manual reduction in the ED On 07/20/2016 who presents for ED follow up of abdominal pain.   Patient was initially and last seen by cardiology on 12/12/15 for evaluation of coronary and aortic atherosclerosis noted on screening CT for lung cancer. There were calcifications in the LAD and RCA distributions. He underwent abdominal ultrasound which showed no evidence of aortic aneurysm. When he was seen in 12/2015 he denied any symptoms concerning angina, though did have some exertional dyspnea. He underwent treadmill Myoview that showed the patient exercised for 8 minutes but was only able to achieve 77% of maximal predicted heart rate with sinus bradycardia on baseline EKG. He was changed to a Lexiscan that was normal with an EF of 55-65%. He was noted to have borderline HLD with an LDL of 98.   He was seen in the ED On 07/20/16 with RLQ abdominal pain and found to have a left inguinal hernia containing fluid-filled small bowel loops with surrounding inflammation with early developing SBO noted on CT abdomen/pelvis. There was also mention of sigmoid diverticulosis, atherosclerotic ectatic abdominal aorta without aneurysm with a maximal dimension of 2.6 cm, and hepatic and renal cysts. His inguinal hernia was reduced with resoluation of his pain. Workup in the ER showed negative troponin 2, mild leukocytosis of 17.3 (patient appears to have a chronically elevated white blood cell count),  potassium 4.3, serum creatinine 1.31 with a baseline of 1.2, lipase 31, EKG showed markedly sinus bradycardia, 43 bpm, first-degree AV block with PR interval 226 ms, lateral Q waves, no acute ST-T changes. Patient was able to demonstrate appropriate chronotropic response in the ER. He was offered admission though declined. He was seen by general surgery with outpatient follow up being advised.   Patient comes in today for preoperative evaluation of planned left inguinal hernia repair. He is feeling well. He does note occasional positional dizziness though never with presyncope or syncope. He is very active outdoors with mowing his 3-4 acre lawn, weeding, tending to his garden, and [employer]. With these activities he has never developed any exertional chest pain. He is unaware of what his baseline heart rate has been over the years though with review of affect and appears his heart rate has ranged from the upper 50s to 70s bpm previously. Patient has been checking his heart rate and blood pressure at home the past few days with BP readings and the 1 teens to 120 over 60s with a heart rate in the low 60s bpm. He has never taken any blood pressure medication and is not on any rate limiting medications including eyedrops. He feels well at this time. No further pain from his left inguinal hernia. He does have preoperative evaluation with surgery scheduled for 7/19.   Past Medical History:  Diagnosis Date  . COPD (chronic obstructive pulmonary disease) ([place])   . Coronary atherosclerosis    a. Treadmill Myoview 12/2015: Exercised for 8 minutes unable to  achieve target heart rate, Lexiscan without significant ischemia, EF 55-65%  . GERD (gastroesophageal reflux disease)   . Inguinal hernia   . Personal history of tobacco use, presenting hazards to health 07/20/2015    Past Surgical History:  Procedure Laterality Date  . HERNIA REPAIR     bilateral inguinal   . TONSILLECTOMY      Current Meds    Medication Sig  . aspirin 81 MG tablet Take 81 mg by mouth daily.    Allergies:   Oxycodone   Social History:  The patient  reports that he quit smoking about 2 years ago. His smoking use included Cigarettes. He has a 50.00 pack-year smoking history. He has never used smokeless tobacco. He reports that he does not drink alcohol or use drugs.   Family History:  The patient's family history includes Heart disease in his father; Hypertension in his father and mother; Stroke in his mother.  ROS:   Review of Systems  Constitutional: Positive for malaise/fatigue. Negative for chills, diaphoresis, fever and weight loss.  HENT: Negative for congestion.   Eyes: Negative for discharge and redness.  Respiratory: Negative for cough, hemoptysis, sputum production, shortness of breath and wheezing.   Cardiovascular: Negative for chest pain, palpitations, orthopnea, claudication, leg swelling and PND.  Gastrointestinal: Positive for abdominal pain. Negative for blood in stool, heartburn, melena, nausea and vomiting.  Genitourinary: Negative for hematuria.  Musculoskeletal: Negative for falls and myalgias.  Skin: Negative for rash.  Neurological: Negative for dizziness, tingling, tremors, sensory change, speech change, focal weakness, loss of consciousness and weakness.  Endo/Heme/Allergies: Does not bruise/bleed easily.  Psychiatric/Behavioral: Negative for substance abuse. The patient is not nervous/anxious.   All other systems reviewed and are negative.    PHYSICAL EXAM:  VS:  BP 120/62 (BP Location: Left Arm, Patient Position: Sitting, Cuff Size: Normal)   Pulse 60   Ht 5' 10.5" (1.791 m)   Wt 157 lb 8 oz (71.4 kg)   BMI 22.28 kg/m  BMI: Body mass index is 22.28 kg/m.  Physical Exam  Constitutional: He is oriented to person, place, and time. He appears well-developed and well-nourished.  HENT:  Head: Normocephalic and atraumatic.  Eyes: Right eye exhibits no discharge. Left eye exhibits  no discharge.  Neck: Normal range of motion. No JVD present.  Cardiovascular: Normal rate, regular rhythm, S1 normal, S2 normal and normal heart sounds.  Exam reveals no distant heart sounds, no friction rub, no midsystolic click and no opening snap.   No murmur heard. Pulmonary/Chest: Effort normal and breath sounds normal. No respiratory distress. He has no decreased breath sounds. He has no wheezes. He has no rales. He exhibits no tenderness.  Abdominal: Soft. He exhibits no distension. There is no tenderness.  Musculoskeletal: He exhibits no edema.  Neurological: He is alert and oriented to person, place, and time.  Skin: Skin is warm and dry. No cyanosis. Nails show no clubbing.  Psychiatric: He has a normal mood and affect. His speech is normal and behavior is normal. Judgment and thought content normal.     EKG:  Was ordered and interpreted by me today. Shows NSR, 60 BPM, first-degree AV block with PR interval 210 ms, lateral Q waves, no acute ST-T changes  Recent Labs: 07/16/2016: TSH 1.73 07/20/2016: ALT 14; BUN 16; Creatinine, Ser 1.31; Hemoglobin 15.1; Platelets 327; Potassium 4.3; Sodium 136  07/16/2016: Cholesterol 162; HDL 26.50; LDL Cholesterol 101; Total CHOL/HDL Ratio 6; Triglycerides 173.0; VLDL 34.6   Estimated Creatinine  Clearance: 52.2 mL/min (A) (by C-G formula based on SCr of 1.31 mg/dL (H)).   Wt Readings from Last 3 Encounters:  07/24/16 157 lb 8 oz (71.4 kg)  07/20/16 156 lb (70.8 kg)  07/16/16 156 lb (70.8 kg)     Other studies reviewed: Additional studies/records reviewed today include: summarized above  ASSESSMENT AND PLAN:  PREOPERATIVE CARDIAC RISK ASSESSMENT:   Revised Cardiac Risk Index:  High Risk Surgery (defined as Intraperitoneal, intrathoracic or suprainguinal vascular): no; (inguinal hernia repair)  Active CAD: no  CHF: no  Cerebrovascular Disease: no   Diabetes: no; On Insulin: no  CKD (Cr >~ 2): no   Total: 0 Estimated Risk of  Adverse Outcome: low risk for low risk surgery. Estimated Risk of MI, PE, VF/VT (Cardiac Arrest), Complete Heart Block: 0.6 %   ACC/AHA Guidelines for "Clearance":  Step 1 - Need for Emergency Surgery: no  If Yes - go straight to OR with perioperative surveillance  Step 2 - Active Cardiac Conditions (Unstable Angina, Decompensated HF, Significant  Arrhytmias - Complete HB, Mobitz II, Symptomatic VT or SVT, Severe Aortic Stenosis - mean gradient > 40 mmHg, Valve area < 1.0 cm2): no    If Yes - Evaluate & Treat per ACC/AHA Guidelines  Step 3 -  Low Risk Surgery: yes  If Yes --> proceed to OR  If No --> Step 4  Step 4 - Functional Capacity >= 4 METS without symptoms:   If Yes --> proceed to OR  If No --> Step 5  Step 5 --  Clinical Risk Factors (CRF)  - Zero --> proceed to OR  -It appears the patient has had a sinus bradycardic rate intermittently for some time now though has been completely asymptomatic. His heart rate was noted to be 43 on 12-lead EKG in the ED recently with a noted first-degree AV block. Patient was asymptomatic. He did demonstrate appropriate chronotropic response in the ED prior to discharge. On today's visit his heart rate is noted to be 60 BPM with first-degree AV block. It is quite possible that he does have increased vagal tone leading to some of this bradycardia in the setting of pain. We will have the patient wear a 48 hour Zio monitor as well as perform a treadmill Myoview to evaluate for both appropriate chronotropic response as well as for high-risk ischemia. If he is able to demonstrate appropriate chronotropic response at baseline and on treadmill and if nuclear stress testing is normal he would be cleared for noncardiac surgery at a low to moderate risk.  1. Coronary atherosclerosis: Asymptomatic at this time. Stress testing as above. 2. Hyperlipidemia: Most recent lipid panel from 07/16/2016 showed LDL of 101 with triglyceride 173. Consider statin  therapy at follow-up. 3. Sinus bradycardia: It appears he is able to demonstrate appropriate chronotropic response given prior triage vital signs with heart rate in the mid 60s to 70 bpm and current heart rate of 60 BPM. Asymptomatic. No indication for transcutaneous pacing, atropine, or dominant pacemaker implantation at this time. If he is unable to demonstrate appropriate chronotropic response, he would need EP evaluation. Recent potassium normal at 4.3. Recent TSH normal. Not on any rate limiting medications including eyedrops. During his surgery, would consider keeping atropine, epinephrine, or dopamine for use if needed nearby.  Disposition: F/u with me in 3-4 weeks.   Current medicines are reviewed at length with the patient today.  The patient did not have any concerns regarding medicines.  Signed, Christell Faith PA-C  07/24/2016 2:50 PM     [address] [place], [place] [id number] [phone]

## 2016-07-24 NOTE — Telephone Encounter (Signed)
Left message to notify pt of lab appt being cancelled.

## 2016-07-24 NOTE — Telephone Encounter (Signed)
Was seen in the ED. Will wait on labs (had in ER). Please cancel.

## 2016-07-24 NOTE — Telephone Encounter (Signed)
Pt coming in next Friday for labs, please place future orders. Thank you.  

## 2016-07-25 ENCOUNTER — Other Ambulatory Visit: Payer: Self-pay | Admitting: *Deleted

## 2016-07-25 ENCOUNTER — Encounter: Payer: Self-pay | Admitting: Surgery

## 2016-07-25 ENCOUNTER — Ambulatory Visit: Payer: Medicare Other | Admitting: Surgery

## 2016-07-25 VITALS — BP 130/68 | HR 65 | Temp 98.3°F | Ht 70.0 in | Wt 158.0 lb

## 2016-07-25 DIAGNOSIS — K4090 Unilateral inguinal hernia, without obstruction or gangrene, not specified as recurrent: Secondary | ICD-10-CM

## 2016-07-25 NOTE — Progress Notes (Signed)
07/25/2016  History of Present Illness: Christian Hebert is a 71 y.o. male who presents as a follow-up from the emergency department with a left inguinal hernia. He went to the emergency room on 7/14 with left inguinal hernia with small bowel protrusion causing early partial small bowel obstruction. The patient had been having nausea and vomiting for a day. In the emergency room, his hernia was reduced and his symptoms improved. He reports that he's had this twice before this episode but the hernia reduced on its own and his symptoms improved on the road. He actually reports that he was not aware that he had a hernia until he went to the emergency room. He presents today for evaluation and discussion for possible hernia repair. He is currently asymptomatic and tolerating a regular diet with no nausea or vomiting and normal bowel movements. He has had prior open bilateral inguinal hernia repairs with mesh about 15 years ago the patient reports that he had significant pain after that surgery and had complications of urinary retention and he reports that he had likely organ dysfunction.  He is currently undergoing cardiac clearance and has a Holter monitor due to bradycardia. He had previously had a stress test on 12/2015 which was negative. He also had a screening CT of the chest on 07/2015 due to prior smoking history which was negative as well.  Past Medical History: Past Medical History:  Diagnosis Date  . COPD (chronic obstructive pulmonary disease) ([place])   . Coronary atherosclerosis    a. Treadmill Myoview 12/2015: Exercised for 8 minutes unable to achieve target heart rate, Lexiscan without significant ischemia, EF 55-65%  . GERD (gastroesophageal reflux disease)   . Inguinal hernia   . Personal history of tobacco use, presenting hazards to health 07/20/2015     Past Surgical History: Past Surgical History:  Procedure Laterality Date  . HERNIA REPAIR     bilateral inguinal   . TONSILLECTOMY       Home Medications: Prior to Admission medications   Medication Sig Start Date End Date Taking? Authorizing Provider  aspirin 81 MG tablet Take 81 mg by mouth daily.    [provider]    Allergies: Allergies  Allergen Reactions  . Oxycodone Nausea And Vomiting    Social History:  reports that he quit smoking about 2 years ago. His smoking use included Cigarettes. He has a 50.00 pack-year smoking history. He has never used smokeless tobacco. He reports that he does not drink alcohol or use drugs.   Family History: Family History  Problem Relation Age of Onset  . Stroke Mother   . Hypertension Mother   . Heart disease Father   . Hypertension Father     Review of Systems: Review of Systems  Constitutional: Negative for chills and fever.  Respiratory: Negative for cough and shortness of breath.   Cardiovascular: Negative for chest pain.  Gastrointestinal: Negative for abdominal pain, constipation, diarrhea, nausea and vomiting.  Genitourinary: Negative for dysuria.  Musculoskeletal: Negative for myalgias.  Skin: Negative for rash.  Neurological: Negative for dizziness.  Psychiatric/Behavioral: Negative for depression.    Physical Exam BP 130/68   Pulse 65   Temp 98.3 F (36.8 C) (Oral)   Ht 5\' 10"  (1.778 m)   Wt 71.7 kg (158 lb)   BMI 22.67 kg/m  CONSTITUTIONAL: No acute distress Physical exam deferred  Labs/Imaging: CT scan 7/14 showed a left inguinal hernia continue fluid filled small bowel loops with surrounding inflammation. Otherwise he also  had sigmoid diverticulosis with no evidence of diverticulitis.  Assessment and Plan: This is a 71 y.o. male who presents with a recurrent left inguinal hernia which had caused a partial small bowel obstruction and was reduced in the procedure room successfully.  Discussed with the patient the role for surgical repair of his left inguinal hernia. Currently given that he has had open bilateral hernia repairs  with mesh 15 years ago, the approach of choice would be laparoscopic inguinal hernia repair with mesh. The patient is very concerned about the potential for pain issues after surgery and I described to him the way that I do the surgery which is a transabdominal preperitoneal approach. He was wondering how Dr. Burt Knack does the surgery given that he saw him in the emergency room. Discussed with the patient that Dr. Burt Knack does a total extraperitoneal approach. Discussed the differences between the 2 and he would rather do surgery with Dr. Burt Knack. I expressed to the patient that this is perfectly understandable and that in no way would it cause any issues with [place]. In light of this, we will schedule the patient to see Dr. Burt Knack on August 1. There will be no charge for this visit.  In the meantime, recommended to the patient that he continue with his workup to obtain cardiac clearance as well as a CT scan of the chest for screening for lung cancer. The patient understands this plan and all of his questions have been answered.   Christian Hebert, [place]

## 2016-07-25 NOTE — Patient Instructions (Signed)
Please see your follow up appointment listed below.   Inguinal Hernia, Adult An inguinal hernia is when fat or the intestines push through the area where the leg meets the lower belly (groin) and make a rounded lump (bulge). This condition happens over time. There are three types of inguinal hernias. These types include:  Hernias that can be pushed back into the belly (are reducible).  Hernias that cannot be pushed back into the belly (are incarcerated).  Hernias that cannot be pushed back into the belly and lose their blood supply (get strangulated). This type needs emergency surgery.  Follow these instructions at home: Lifestyle  Drink enough fluid to keep your urine (pee) clear or pale yellow.  Eat plenty of fruits, vegetables, and whole grains. These have a lot of fiber. Talk with your doctor if you have questions.  Avoid lifting heavy objects.  Avoid standing for long periods of time.  Do not use tobacco products. These include cigarettes, chewing tobacco, or e-cigarettes. If you need help quitting, ask your doctor.  Try to stay at a healthy weight. General instructions  Do not try to force the hernia back in.  Watch your hernia for any changes in color or size. Let your doctor know if there are any changes.  Take over-the-counter and prescription medicines only as told by your doctor.  Keep all follow-up visits as told by your doctor. This is important. Contact a doctor if:  You have a fever.  You have new symptoms.  Your symptoms get worse. Get help right away if:  The area where the legs meets the lower belly has: ? Pain that gets worse suddenly. ? A bulge that gets bigger suddenly and does not go down. ? A bulge that turns red or purple. ? A bulge that is painful to the touch.  You are a man and your scrotum: ? Suddenly feels painful. ? Suddenly changes in size.  You feel sick to your stomach (nauseous) and this feeling does not go away.  You throw up  (vomit) and this keeps happening.  You feel your heart beating a lot more quickly than normal.  You cannot poop (have a bowel movement) or pass gas. This information is not intended to replace advice given to you by your health care provider. Make sure you discuss any questions you have with your health care provider. Document Released: 01/24/2006 Document Revised: 06/01/2015 Document Reviewed: 11/03/2013 Elsevier Interactive Patient Education  2018 [employer].

## 2016-07-26 ENCOUNTER — Ambulatory Visit
Admission: RE | Admit: 2016-07-26 | Discharge: 2016-07-26 | Disposition: A | Discharge status: Home / Self Care | Payer: Medicare Other | Source: Ambulatory Visit | Attending: Physician Assistant | Admitting: Physician Assistant

## 2016-07-26 ENCOUNTER — Other Ambulatory Visit
Admission: RE | Admit: 2016-07-26 | Discharge: 2016-07-26 | Disposition: A | Discharge status: Home / Self Care | Payer: Medicare Other | Source: Ambulatory Visit | Attending: Physician Assistant | Admitting: Physician Assistant

## 2016-07-26 DIAGNOSIS — I2510 Atherosclerotic heart disease of native coronary artery without angina pectoris: Secondary | ICD-10-CM | POA: Diagnosis not present

## 2016-07-26 DIAGNOSIS — E782 Mixed hyperlipidemia: Secondary | ICD-10-CM | POA: Insufficient documentation

## 2016-07-26 DIAGNOSIS — I2584 Coronary atherosclerosis due to calcified coronary lesion: Secondary | ICD-10-CM | POA: Insufficient documentation

## 2016-07-26 LAB — NM MYOCAR MULTI W/SPECT W/WALL MOTION / EF
CHL CUP NUCLEAR SDS: 1
CHL CUP NUCLEAR SRS: 2
CHL CUP NUCLEAR SSS: 2
CSEPED: 6 min
CSEPEW: 9 METS
Exercise duration (sec): 59 s
LV dias vol: 74 mL (ref 62–150)
LV sys vol: 23 mL
Peak HR: 127 {beats}/min
Percent HR: 85 %
Rest HR: 48 {beats}/min
TID: 1.02

## 2016-07-26 LAB — MAGNESIUM: Magnesium: 2 mg/dL (ref 1.7–2.4)

## 2016-07-26 MED ORDER — TECHNETIUM TC 99M TETROFOSMIN IV KIT
31.7800 | PACK | Freq: Once | INTRAVENOUS | Status: AC | PRN
  Administered 2016-07-26: 31.78 via INTRAVENOUS

## 2016-07-26 MED ORDER — TECHNETIUM TC 99M TETROFOSMIN IV KIT
13.5900 | PACK | Freq: Once | INTRAVENOUS | Status: AC | PRN
  Administered 2016-07-26: 13.59 via INTRAVENOUS

## 2016-07-30 ENCOUNTER — Other Ambulatory Visit: Payer: Self-pay

## 2016-08-02 ENCOUNTER — Other Ambulatory Visit: Payer: Medicare Other

## 2016-08-06 DIAGNOSIS — Z1211 Encounter for screening for malignant neoplasm of colon: Secondary | ICD-10-CM | POA: Diagnosis not present

## 2016-08-06 LAB — COLOGUARD: Cologuard: NEGATIVE

## 2016-08-07 ENCOUNTER — Ambulatory Visit (INDEPENDENT_AMBULATORY_CARE_PROVIDER_SITE_OTHER): Payer: Medicare Other | Admitting: Surgery

## 2016-08-07 ENCOUNTER — Encounter: Payer: Self-pay | Admitting: Surgery

## 2016-08-07 VITALS — BP 110/71 | HR 71 | Temp 97.6°F | Ht 70.0 in | Wt 157.2 lb

## 2016-08-07 DIAGNOSIS — I2584 Coronary atherosclerosis due to calcified coronary lesion: Secondary | ICD-10-CM

## 2016-08-07 DIAGNOSIS — I2510 Atherosclerotic heart disease of native coronary artery without angina pectoris: Secondary | ICD-10-CM

## 2016-08-07 DIAGNOSIS — K4021 Bilateral inguinal hernia, without obstruction or gangrene, recurrent: Secondary | ICD-10-CM | POA: Diagnosis not present

## 2016-08-07 MED ORDER — TAMSULOSIN HCL 0.4 MG PO CAPS: 0.4000 mg | ORAL_CAPSULE | Freq: Every day | ORAL | 1 refills | Status: AC

## 2016-08-07 MED ORDER — HYDROCODONE-ACETAMINOPHEN 10-325 MG PO TABS: 1.0000 | ORAL_TABLET | Freq: Four times a day (QID) | ORAL | 0 refills | Status: DC | PRN

## 2016-08-07 NOTE — Progress Notes (Signed)
Surgical Consultation  08/07/2016  Christian Hebert is an 71 y.o. male.   CC: Left inguinal hernia  HPI: This patient met in the emergency room with a left anal hernia that was reduced by Dr. Alford Highland or at least spontaneously reduced after being seen by Dr. Alford Highland. When I saw him that it was completely reduced and his symptoms are completely gone.    Past Medical History:  Diagnosis Date  . COPD (chronic obstructive pulmonary disease) ([place])   . Coronary atherosclerosis    a. Treadmill Myoview 12/2015: Exercised for 8 minutes unable to achieve target heart rate, Lexiscan without significant ischemia, EF 55-65%  . GERD (gastroesophageal reflux disease)   . Inguinal hernia   . Personal history of tobacco use, presenting hazards to health 07/20/2015    Past Surgical History:  Procedure Laterality Date  . HERNIA REPAIR     bilateral inguinal   . TONSILLECTOMY      Family History  Problem Relation Age of Onset  . Stroke Mother   . Hypertension Mother   . Heart disease Father   . Hypertension Father     Social History:  reports that he quit smoking about 2 years ago. His smoking use included Cigarettes. He has a 50.00 pack-year smoking history. He has never used smokeless tobacco. He reports that he does not drink alcohol or use drugs.  Allergies:  Allergies  Allergen Reactions  . Oxycodone Nausea And Vomiting    Medications reviewed.   Review of Systems:   Review of Systems  Constitutional: Negative.   HENT: Negative.   Eyes: Negative.   Respiratory: Negative.   Cardiovascular: Negative.   Gastrointestinal: Positive for abdominal pain. Negative for constipation, diarrhea, heartburn, nausea and vomiting.  Genitourinary: Negative.   Musculoskeletal: Negative.   Skin: Negative.   Neurological: Negative.   Endo/Heme/Allergies: Negative.   Psychiatric/Behavioral: Negative.      Physical Exam:  Ht 5' 10"  (1.778 m)   Physical Exam  Constitutional: He is  oriented to person, place, and time and well-developed, well-nourished, and in no distress. No distress.  HENT:  Head: Normocephalic and atraumatic.  Eyes: Pupils are equal, round, and reactive to light. Right eye exhibits no discharge. Left eye exhibits no discharge. No scleral icterus.  Neck: Normal range of motion. No JVD present.  Cardiovascular: Normal rate, regular rhythm and normal heart sounds.   Pulmonary/Chest: Effort normal and breath sounds normal. No respiratory distress. He has no wheezes. He has no rales.  Abdominal: Soft. He exhibits no distension. There is no tenderness. There is no rebound and no guarding.  Bilateral groin scars  Genitourinary: Penis normal.  Genitourinary Comments: Patient is examined standing supine and with Valsalva. He has bilateral inguinal recurrent hernias testicles are normal and nontender  Musculoskeletal: Normal range of motion. He exhibits no edema or tenderness.  Lymphadenopathy:    He has no cervical adenopathy.  Neurological: He is alert and oriented to person, place, and time.  Skin: Skin is warm and dry. No rash noted. He is not diaphoretic. No erythema.  Psychiatric: Mood and affect normal.  Vitals reviewed.     No results found for this or any previous visit (from the past 48 hour(s)). No results found.  Assessment/Plan:  Bilateral recurrent inguinal hernias. Patient was in the emergency room recently with a left inguinal hernia which was incarcerated but spontaneously reduced while in the ED. Today he has no sign of incarceration. I would recommend repair of bilateral inguinal hernias via  laparoscopic approach. I described for him the procedure and the options as well as risks of bleeding infection recurrence ischemic orchitis chronic pain syndrome and neuroma. He is considerably concerned about pain because he had both bilateral alar hernias repaired previously at the same time using an open procedure which is a known producer of  considerable discomfort.  Patient has significant BPH symptoms with nocturia twice a night. I will start him on Flomax preoperatively and he wants to wait one month before surgery. The risks of bleeding infection as above were all discussed with him and the potential for leaving a catheter in overnight since he required catheterization postoperatively from his prior bilateral inguinal hernia repair was discussed.  Wife states that he is obtained cardiac clearance but with his bradycardia I would like him to see anesthesia preoperatively as well.  Florene Glen, MD, FACS

## 2016-08-07 NOTE — Patient Instructions (Signed)
We have scheduled your Bilateral Laparoscopic Inguinal Hernia Repair with Dr. Burt Knack on 09/12/16.  We have sent over a prescription for FloMax that you will need to start on and refill until your surgery date.  We have also given you a prescription to fill after your surgery as well.  If you have any questions or concerns prior to your surgery please give our office a call.

## 2016-08-08 ENCOUNTER — Telehealth: Payer: Self-pay | Admitting: Surgery

## 2016-08-08 NOTE — Telephone Encounter (Signed)
Pt advised of pre op date/time and sx date. Sx: 09/12/16 with Dr Merri Ray laparoscopic inguinal hernia repair.  Pre op: 09/03/16 @ 1:00pm--office.   Patient made aware to call [phone], between 1-3:00pm the day before surgery, to find out what time to arrive.

## 2016-08-12 ENCOUNTER — Other Ambulatory Visit: Payer: Medicare Other

## 2016-08-21 ENCOUNTER — Telehealth: Payer: Self-pay | Admitting: *Deleted

## 2016-08-21 NOTE — Telephone Encounter (Signed)
Contacted regarding lung screening scan due, patient request call back in 1 month due to surgery in near future.

## 2016-09-02 ENCOUNTER — Telehealth: Payer: Self-pay | Admitting: General Practice

## 2016-09-02 ENCOUNTER — Encounter
Admission: RE | Admit: 2016-09-02 | Discharge: 2016-09-02 | Disposition: A | Discharge status: Home / Self Care | Payer: Medicare Other | Source: Ambulatory Visit | Attending: Surgery | Admitting: Surgery

## 2016-09-02 ENCOUNTER — Telehealth: Payer: Self-pay | Admitting: Surgery

## 2016-09-02 DIAGNOSIS — Z01818 Encounter for other preprocedural examination: Secondary | ICD-10-CM | POA: Insufficient documentation

## 2016-09-02 LAB — BASIC METABOLIC PANEL
ANION GAP: 8 (ref 5–15)
BUN: 12 mg/dL (ref 6–20)
CALCIUM: 9.2 mg/dL (ref 8.9–10.3)
CO2: 26 mmol/L (ref 22–32)
Chloride: 103 mmol/L (ref 101–111)
Creatinine, Ser: 1.39 mg/dL — ABNORMAL HIGH (ref 0.61–1.24)
GFR calc Af Amer: 57 mL/min — ABNORMAL LOW (ref >60)
GFR calc non Af Amer: 49 mL/min — ABNORMAL LOW (ref >60)
GLUCOSE: 90 mg/dL (ref 65–99)
POTASSIUM: 4.5 mmol/L (ref 3.5–5.1)
Sodium: 137 mmol/L (ref 135–145)

## 2016-09-02 LAB — CBC WITH DIFFERENTIAL/PLATELET
BASOS ABS: 0.1 10*3/uL (ref 0–0.1)
Basophils Relative: 1 %
EOS PCT: 1 %
Eosinophils Absolute: 0.1 10*3/uL (ref 0–0.7)
HEMATOCRIT: 45.8 % (ref 40.0–52.0)
HEMOGLOBIN: 15.4 g/dL (ref 13.0–18.0)
LYMPHS ABS: 1.7 10*3/uL (ref 1.0–3.6)
Lymphocytes Relative: 14 %
MCH: 30.5 pg (ref 26.0–34.0)
MCHC: 33.7 g/dL (ref 32.0–36.0)
MCV: 90.5 fL (ref 80.0–100.0)
Monocytes Absolute: 1.3 10*3/uL — ABNORMAL HIGH (ref 0.2–1.0)
Monocytes Relative: 11 %
NEUTROS ABS: 8.6 10*3/uL — AB (ref 1.4–6.5)
NEUTROS PCT: 73 %
PLATELETS: 341 10*3/uL (ref 150–440)
RBC: 5.06 MIL/uL (ref 4.40–5.90)
RDW: 13.8 % (ref 11.5–14.5)
WBC: 11.9 10*3/uL — AB (ref 3.8–10.6)

## 2016-09-02 MED ORDER — PROCHLORPERAZINE MALEATE 10 MG PO TABS: 10.0000 mg | ORAL_TABLET | Freq: Four times a day (QID) | ORAL | 0 refills | Status: DC | PRN

## 2016-09-02 NOTE — Patient Instructions (Signed)
Your procedure is scheduled on: Thursday 09/12/16 Report to [facility]. 2ND FLOOR MEDICAL MALL ENTRANCE. To find out your arrival time please call [phone] between 1PM - 3PM on Wednesday 09/11/16.  Remember: Instructions that are not followed completely may result in serious medical risk, up to and including death, or upon the discretion of your surgeon and anesthesiologist your surgery may need to be rescheduled.    __X__ 1. Do not eat food or drink liquids after midnight. No gum chewing or hard candies.     __X__ 2. No Alcohol for 24 hours before or after surgery.   ____ 3. Bring all medications with you on the day of surgery if instructed.    __X__ 4. Notify your doctor if there is any change in your medical condition     (cold, fever, infections).             __X___5. No smoking within 24 hours of your surgery.     Do not wear jewelry, make-up, hairpins, clips or nail polish.  Do not wear lotions, powders, or perfumes.   Do not shave 48 hours prior to surgery. Men may shave face and neck.  Do not bring valuables to the hospital.    [facility] is not responsible for any belongings or valuables.               Contacts, dentures or bridgework may not be worn into surgery.  Leave your suitcase in the car. After surgery it may be brought to your room.  For patients admitted to the hospital, discharge time is determined by your                treatment team.   Patients discharged the day of surgery will not be allowed to drive home.   Please read over the following fact sheets that you were given:   MRSA Information   __X__ Take these medicines the morning of surgery with A SIP OF WATER:    1. TAMSULOSIN  2.   3.   4.  5.  6.  ____ Fleet Enema (as directed)   __X__ Use CHG Soap as directed  ____ Use inhalers on the day of surgery  ____ Stop metformin 2 days prior to surgery    ____ Take 1/2 of usual insulin dose the night before surgery and none on the morning of  surgery.   __X__ Stop Coumadin/Plavix/aspirin on LAST DOSE WEDNESDAY   __X__ Stop Anti-inflammatories such as Advil, Aleve, Ibuprofen, Motrin, Naproxen, Naprosyn, Goodies,powder, or aspirin products.  OK to take Tylenol.   ____ Stop supplements until after surgery.    ____ Bring C-Pap to the hospital.

## 2016-09-02 NOTE — Telephone Encounter (Signed)
Spoke with Dr. Burt Knack at this time. He has ordered Compazine 10mg  tablets- 1 q6h prn #20 with 0 RF. Medication sent to pharmacy.  Returned phone call to patient. I explained to him that medication was sent in for him but also told him to take medication after eating , taking 1 compazine tablet, and a full stomach.  Also, explained to patient that he may cut his pain medication tablets in half to help avoid nausea as this may be enough to help with pain without causing the sickness. He verbalizes understanding of all information given and will pick medication up at pharmacy.

## 2016-09-02 NOTE — Telephone Encounter (Signed)
Sherry from Pre-Admit called to inform that patient's H&P will be out of date prior to surgery.

## 2016-09-02 NOTE — Pre-Procedure Instructions (Signed)
Reviewed patients medical history and cardiology notes with Dr Rosey Bath. No new orders.

## 2016-09-02 NOTE — Telephone Encounter (Signed)
Patient came by the office he is asking if he can have some medication called into the pharmacy for his nausea, patient said the oxycodone makes him sick patient is using [facility] in [place]. Please call patient and advice.

## 2016-09-12 ENCOUNTER — Ambulatory Visit
Admission: RE | Admit: 2016-09-12 | Discharge: 2016-09-12 | Disposition: A | Discharge status: Home / Self Care | Payer: Medicare Other | Source: Ambulatory Visit | Attending: Surgery | Admitting: Surgery

## 2016-09-12 ENCOUNTER — Ambulatory Visit: Payer: Medicare Other | Admitting: Anesthesiology

## 2016-09-12 ENCOUNTER — Encounter: Payer: Self-pay | Admitting: *Deleted

## 2016-09-12 ENCOUNTER — Encounter
Admission: RE | Disposition: A | Discharge status: Home / Self Care | Payer: Self-pay | Source: Ambulatory Visit | Attending: Surgery

## 2016-09-12 DIAGNOSIS — I739 Peripheral vascular disease, unspecified: Secondary | ICD-10-CM | POA: Insufficient documentation

## 2016-09-12 DIAGNOSIS — K4021 Bilateral inguinal hernia, without obstruction or gangrene, recurrent: Secondary | ICD-10-CM | POA: Diagnosis not present

## 2016-09-12 DIAGNOSIS — Z79899 Other long term (current) drug therapy: Secondary | ICD-10-CM | POA: Insufficient documentation

## 2016-09-12 DIAGNOSIS — I2510 Atherosclerotic heart disease of native coronary artery without angina pectoris: Secondary | ICD-10-CM | POA: Diagnosis not present

## 2016-09-12 DIAGNOSIS — M1990 Unspecified osteoarthritis, unspecified site: Secondary | ICD-10-CM | POA: Diagnosis not present

## 2016-09-12 DIAGNOSIS — K219 Gastro-esophageal reflux disease without esophagitis: Secondary | ICD-10-CM | POA: Insufficient documentation

## 2016-09-12 DIAGNOSIS — Z87891 Personal history of nicotine dependence: Secondary | ICD-10-CM | POA: Insufficient documentation

## 2016-09-12 DIAGNOSIS — E785 Hyperlipidemia, unspecified: Secondary | ICD-10-CM | POA: Diagnosis not present

## 2016-09-12 DIAGNOSIS — Z8249 Family history of ischemic heart disease and other diseases of the circulatory system: Secondary | ICD-10-CM | POA: Diagnosis not present

## 2016-09-12 DIAGNOSIS — Z9889 Other specified postprocedural states: Secondary | ICD-10-CM

## 2016-09-12 DIAGNOSIS — J449 Chronic obstructive pulmonary disease, unspecified: Secondary | ICD-10-CM | POA: Diagnosis not present

## 2016-09-12 DIAGNOSIS — Z7982 Long term (current) use of aspirin: Secondary | ICD-10-CM | POA: Diagnosis not present

## 2016-09-12 DIAGNOSIS — K4090 Unilateral inguinal hernia, without obstruction or gangrene, not specified as recurrent: Secondary | ICD-10-CM | POA: Diagnosis not present

## 2016-09-12 DIAGNOSIS — Z8719 Personal history of other diseases of the digestive system: Secondary | ICD-10-CM

## 2016-09-12 DIAGNOSIS — Z885 Allergy status to narcotic agent status: Secondary | ICD-10-CM | POA: Insufficient documentation

## 2016-09-12 HISTORY — PX: INGUINAL HERNIA REPAIR: SHX194

## 2016-09-12 SURGERY — REPAIR, HERNIA, INGUINAL, BILATERAL, LAPAROSCOPIC
Anesthesia: General | Site: Inguinal | Laterality: Bilateral | Wound class: Clean

## 2016-09-12 MED ORDER — FENTANYL CITRATE (PF) 100 MCG/2ML IJ SOLN
INTRAMUSCULAR | Status: DC | PRN
  Administered 2016-09-12 (×2): 50 ug via INTRAVENOUS

## 2016-09-12 MED ORDER — CEFAZOLIN SODIUM-DEXTROSE 2-4 GM/100ML-% IV SOLN
2.0000 g | INTRAVENOUS | Status: AC
  Administered 2016-09-12: 2 g via INTRAVENOUS

## 2016-09-12 MED ORDER — LIDOCAINE HCL (CARDIAC) 20 MG/ML IV SOLN: INTRAVENOUS | Status: DC | PRN

## 2016-09-12 MED ORDER — KETOROLAC TROMETHAMINE 30 MG/ML IJ SOLN
INTRAMUSCULAR | Status: DC | PRN
  Administered 2016-09-12: 15 mg via INTRAVENOUS

## 2016-09-12 MED ORDER — DEXAMETHASONE SODIUM PHOSPHATE 10 MG/ML IJ SOLN
INTRAMUSCULAR | Status: DC | PRN
  Administered 2016-09-12: 5 mg via INTRAVENOUS

## 2016-09-12 MED ORDER — ONDANSETRON HCL 4 MG/2ML IJ SOLN: 4.0000 mg | Freq: Once | INTRAMUSCULAR | Status: DC | PRN

## 2016-09-12 MED ORDER — CHLORHEXIDINE GLUCONATE CLOTH 2 % EX PADS: 6.0000 | MEDICATED_PAD | Freq: Once | CUTANEOUS | Status: DC

## 2016-09-12 MED ORDER — PROPOFOL 10 MG/ML IV BOLUS
INTRAVENOUS | Status: DC | PRN
  Administered 2016-09-12: 150 mg via INTRAVENOUS

## 2016-09-12 MED ORDER — ROCURONIUM BROMIDE 100 MG/10ML IV SOLN
INTRAVENOUS | Status: DC | PRN
  Administered 2016-09-12: 30 mg via INTRAVENOUS

## 2016-09-12 MED ORDER — LACTATED RINGERS IV SOLN
INTRAVENOUS | Status: DC
  Administered 2016-09-12 (×2): via INTRAVENOUS

## 2016-09-12 MED ORDER — SUGAMMADEX SODIUM 200 MG/2ML IV SOLN
INTRAVENOUS | Status: DC | PRN
  Administered 2016-09-12: 200 mg via INTRAVENOUS

## 2016-09-12 MED ORDER — FAMOTIDINE 20 MG PO TABS
20.0000 mg | ORAL_TABLET | Freq: Once | ORAL | Status: AC
  Administered 2016-09-12: 20 mg via ORAL

## 2016-09-12 MED ORDER — FENTANYL CITRATE (PF) 100 MCG/2ML IJ SOLN: 25.0000 ug | INTRAMUSCULAR | Status: DC | PRN

## 2016-09-12 MED ORDER — MIDAZOLAM HCL 2 MG/2ML IJ SOLN
INTRAMUSCULAR | Status: DC | PRN
  Administered 2016-09-12: 2 mg via INTRAVENOUS

## 2016-09-12 MED ORDER — BUPIVACAINE-EPINEPHRINE (PF) 0.25% -1:200000 IJ SOLN
INTRAMUSCULAR | Status: DC | PRN
  Administered 2016-09-12: 30 mL via PERINEURAL

## 2016-09-12 MED ORDER — ONDANSETRON HCL 4 MG/2ML IJ SOLN
INTRAMUSCULAR | Status: DC | PRN
  Administered 2016-09-12: 4 mg via INTRAVENOUS

## 2016-09-12 MED ORDER — HEPARIN SODIUM (PORCINE) 5000 UNIT/ML IJ SOLN
5000.0000 [IU] | Freq: Once | INTRAMUSCULAR | Status: AC
  Administered 2016-09-12: 5000 [IU] via SUBCUTANEOUS

## 2016-09-12 SURGICAL SUPPLY — 34 items
ADHESIVE MASTISOL STRL (MISCELLANEOUS) ×2 IMPLANT
BLADE CLIPPER SURG (BLADE) ×2 IMPLANT
BLADE SURG SZ11 CARB STEEL (BLADE) ×2 IMPLANT
CHLORAPREP W/TINT 26ML (MISCELLANEOUS) ×2 IMPLANT
DISSECT BALLN SPACEMKR OVL PDB (BALLOONS) ×2
DISSECT BALLN SPACEMKR RND PDB (MISCELLANEOUS)
DISSECTOR BALLN SPCMKR OVL PDB (BALLOONS) ×1 IMPLANT
DISSECTOR BALLN SPCMKR RND PDB (MISCELLANEOUS) IMPLANT
GAUZE SPONGE NON-WVN 2X2 STRL (MISCELLANEOUS) ×2 IMPLANT
GLOVE BIO SURGEON STRL SZ8 (GLOVE) ×2 IMPLANT
GOWN STRL REUS W/ TWL LRG LVL3 (GOWN DISPOSABLE) ×2 IMPLANT
GOWN STRL REUS W/TWL LRG LVL3 (GOWN DISPOSABLE) ×2
IRRIGATION STRYKERFLOW (MISCELLANEOUS) IMPLANT
IRRIGATOR STRYKERFLOW (MISCELLANEOUS)
LABEL OR SOLS (LABEL) ×2 IMPLANT
MESH HERNIA 4X6 PROLITE RECT (Mesh General) ×2 IMPLANT
MESH POLY 4X6 (Mesh General) ×2 IMPLANT
NDL SAFETY 22GX1.5 (NEEDLE) ×2 IMPLANT
NS IRRIG 500ML POUR BTL (IV SOLUTION) ×2 IMPLANT
PACK LAP CHOLECYSTECTOMY (MISCELLANEOUS) ×2 IMPLANT
SPONGE LAP 18X18 5 PK (GAUZE/BANDAGES/DRESSINGS) ×2 IMPLANT
SPONGE VERSALON 2X2 STRL (MISCELLANEOUS) ×2
STRIP CLOSURE SKIN 1/2X4 (GAUZE/BANDAGES/DRESSINGS) ×2 IMPLANT
SURGILUBE 2OZ TUBE FLIPTOP (MISCELLANEOUS) ×2 IMPLANT
SUT ETHIBOND 0 (SUTURE) ×2 IMPLANT
SUT MNCRL 4-0 (SUTURE) ×2
SUT MNCRL 4-0 27XMFL (SUTURE) ×2
SUT VICRYL 0 AB UR-6 (SUTURE) ×2 IMPLANT
SUTURE MNCRL 4-0 27XMF (SUTURE) ×2 IMPLANT
TACKER 5MM HERNIA 3.5CML NAB (ENDOMECHANICALS) ×4 IMPLANT
TRAY FOLEY W/METER SILVER 16FR (SET/KITS/TRAYS/PACK) ×2 IMPLANT
TROCAR 5MM SINGLE VERSAONE (TROCAR) ×4 IMPLANT
TROCAR BALLN 10M OMST10SB SPAC (TROCAR) ×2 IMPLANT
TUBING INSUFFLATOR HI FLOW (MISCELLANEOUS) ×2 IMPLANT

## 2016-09-12 NOTE — Anesthesia Procedure Notes (Signed)
Procedure Name: Intubation Date/Time: 09/12/2016 1:50 PM Performed by: Justus Memory Pre-anesthesia Checklist: Patient identified, Patient being monitored, Timeout performed, Emergency Drugs available and Suction available Patient Re-evaluated:Patient Re-evaluated prior to induction Oxygen Delivery Method: Circle system utilized Preoxygenation: Pre-oxygenation with 100% oxygen Induction Type: IV induction Ventilation: Mask ventilation without difficulty Laryngoscope Size: Mac and 3 Grade View: Grade I Tube type: Oral Tube size: 7.0 mm Number of attempts: 1 Airway Equipment and Method: Stylet Placement Confirmation: ETT inserted through vocal cords under direct vision,  positive ETCO2 and breath sounds checked- equal and bilateral Secured at: 21 cm Tube secured with: Tape Dental Injury: Teeth and Oropharynx as per pre-operative assessment

## 2016-09-12 NOTE — Op Note (Signed)
Laparoscopic Inguinal Hernia Repair  Christian Hebert  09/12/2016  Pre-operative Diagnosis: Bilateral recurrent Inguinal Hernia  Post-operative Diagnosis: Bilateral recurrent Inguinal hernia  Procedure: Laparoscopic preperitoneal repair of bilateral recurrent inguinal hernia   Surgeon: Jerrol Banana. Burt Knack, MD FACS  Anesthesia: Gen. with endotracheal tube  Assistant: Surgical tech  Procedure Details  The patient was seen again in the Holding Room. The benefits, complications, treatment options, and expected outcomes were discussed with the patient. The risks of bleeding, infection, recurrence of symptoms, failure to resolve symptoms, recurrence of hernia, ischemic orchitis, chronic pain syndrome or neuroma, were discussed again. The likelihood of improving the patient's symptoms with return to their baseline status is good.  The patient and/or family concurred with the proposed plan, giving informed consent.  The patient was taken to Operating Room, identified as Christian Hebert and the procedure verified as Laparoscopic bilateral Inguinal Hernia Repair. Laterality confirmed.  A Time Out was held and the above information confirmed.  Prior to the induction of general anesthesia, antibiotic prophylaxis was administered. VTE prophylaxis was in place. General endotracheal anesthesia was then administered and tolerated well. A Foley catheter was placed by the nursing staff. After the induction, the abdomen was prepped with Chloraprep and draped in the sterile fashion. The patient was positioned in the supine position.  Local anesthetic  was injected into the skin near the umbilicus and an incision made. An incision was made and dissection down to the rectus fascia was performed. The fascia was incised and the muscle retracted laterally. The Covidien dissecting balloon was placed followed by the structural balloon. Immediately was noted that the peritoneum had torn in the midline allowing for  visualization of the intra-abdominal cavity. The hernias were identified easily. Under direct vision 25 mm midline ports were placed to aid in dissection of the peritoneum and the preperitoneal space to delineate bilateral recurrent inguinal hernias. Mesh and sutures were noted and incarcerations of preperitoneal fat was reduced with blunt dissection. Traction was utilized as well.   Dissection was performed to delineate Yazid Pop's ligament and the lateral extent of dissection was determined. The nerve on the lateral abdominal wall was identified and kept in view at all times. The cord was skeletonized of small indirect sacs bilaterally and cord lipoma which was retracted cephalad.   Once this was complete, a laterally scissored Atrium mesh was placed into the preperitoneal space. It was held in place with the Covidien tacking device avoiding the area of the nerve. This was performed on each side adequately covering the visible mesh and sutures with recurrence. Once assuring that the hernia was completely repaired and adequately covered, the preperitoneal space was desufflated under direct vision. Reperitonealization was performed utilizing the [place] surgical tacking device lifting the bladder flap up to the anterior abdominal wall and holding it in with surgical tacker. There was no sign of peritoneal rent and no sign of bowel intrusion towards the mesh. This was confirmed by positioning the patient had down in neutral position.  Once assuring that hemostasis was adequate the ports were removed and a figure-of-eight 0 Vicryl suture was placed at the fascial edges. 4-0 subcuticular Monocryl was used at all skin edges. Steri-Strips and Mastisol and sterile dressings were placed.  Patient tolerated the procedure well. There were no complications. He was taken to the recovery room in stable condition to be discharged to the care of his family and follow-up in 10 days.    Findings: Small bilateral recurrent  hernias with incarcerated preperitoneal  fat. Reperitonealization required due to tear of the peritoneum by the dissecting balloon in the midline.                        Christian Hebert E. Burt Knack, MD, FACS

## 2016-09-12 NOTE — Transfer of Care (Signed)
Immediate Anesthesia Transfer of Care Note  Patient: Christian Hebert  Procedure(s) Performed: Procedure(s): LAPAROSCOPIC BILATERAL INGUINAL HERNIA REPAIR (Bilateral)  Patient Location: PACU  Anesthesia Type:General  Level of Consciousness: awake  Airway & Oxygen Therapy: Patient Spontanous Breathing and Patient connected to face mask oxygen  Post-op Assessment: Report given to RN and Post -op Vital signs reviewed and stable  Post vital signs: Reviewed and stable  Last Vitals:  Vitals:   09/12/16 1156  BP: (!) 145/94  Pulse: 69  Resp: 18  Temp: (!) 36.3 C  SpO2: 98%    Last Pain:  Vitals:   09/12/16 1156  TempSrc: Oral  PainSc: 2          Complications: No apparent anesthesia complications

## 2016-09-12 NOTE — Anesthesia Post-op Follow-up Note (Signed)
Anesthesia QCDR form completed.        

## 2016-09-12 NOTE — Anesthesia Preprocedure Evaluation (Signed)
Anesthesia Evaluation  Patient identified by MRN, date of birth, ID band Patient awake    Reviewed: Allergy & Precautions, NPO status , Patient's Chart, lab work & pertinent test results, reviewed documented beta blocker date and time   Airway Mallampati: II  TM Distance: >3 FB     Dental  (+) Chipped   Pulmonary COPD, former smoker,           Cardiovascular + CAD and + Peripheral Vascular Disease       Neuro/Psych    GI/Hepatic GERD  Controlled,  Endo/Other    Renal/GU      Musculoskeletal  (+) Arthritis ,   Abdominal   Peds  Hematology   Anesthesia Other Findings EF 60. HOH.  Reproductive/Obstetrics                             Anesthesia Physical Anesthesia Plan  ASA: III  Anesthesia Plan: General   Post-op Pain Management:    Induction: Intravenous  PONV Risk Score and Plan:   Airway Management Planned: Oral ETT  Additional Equipment:   Intra-op Plan:   Post-operative Plan:   Informed Consent: I have reviewed the patients History and Physical, chart, labs and discussed the procedure including the risks, benefits and alternatives for the proposed anesthesia with the patient or authorized representative who has indicated his/her understanding and acceptance.     Plan Discussed with: CRNA  Anesthesia Plan Comments:         Anesthesia Quick Evaluation

## 2016-09-12 NOTE — Anesthesia Postprocedure Evaluation (Signed)
Anesthesia Post Note  Patient: Christian Hebert  Procedure(s) Performed: Procedure(s) (LRB): LAPAROSCOPIC BILATERAL INGUINAL HERNIA REPAIR (Bilateral)  Patient location during evaluation: PACU Anesthesia Type: General Level of consciousness: awake and alert and oriented Pain management: pain level controlled Vital Signs Assessment: post-procedure vital signs reviewed and stable Respiratory status: spontaneous breathing Cardiovascular status: blood pressure returned to baseline Anesthetic complications: no     Last Vitals:  Vitals:   09/12/16 1609 09/12/16 1643  BP: 130/70 133/78  Pulse: (!) 50 64  Resp: 16 16  Temp: (!) 36.3 C   SpO2: 98% 98%    Last Pain:  Vitals:   09/12/16 1643  TempSrc:   PainSc: 2                  Hibah Odonnell

## 2016-09-12 NOTE — Progress Notes (Signed)
Assessment done at 1325

## 2016-09-12 NOTE — Discharge Instructions (Signed)
Remove dressing in 24 hours. May shower in 24 hours. Leave paper strips in place. Resume all home medications. Follow-up with Dr. Burt Knack in 10 days.   AMBULATORY SURGERY  DISCHARGE INSTRUCTIONS   1) The drugs that you were given will stay in your system until tomorrow so for the next 24 hours you should not:  A) Drive an automobile B) Make any legal decisions C) Drink any alcoholic beverage   2) You may resume regular meals tomorrow.  Today it is better to start with liquids and gradually work up to solid foods.  You may eat anything you prefer, but it is better to start with liquids, then soup and crackers, and gradually work up to solid foods.   3) Please notify your doctor immediately if you have any unusual bleeding, trouble breathing, redness and pain at the surgery site, drainage, fever, or pain not relieved by medication.   4) Additional Instructions:

## 2016-09-12 NOTE — H&P (Signed)
Christian Hebert is an 71 y.o. male.    Chief Complaint: Bilateral recurrent inguinal hernias  HPI: This patient with bilateral recurrent inguinal hernias. He had a left renal hernia which was incarcerated but spontaneously reduced in the emergency room. Subsequent to seen him in the office. He is here for elective repair of bilateral recurrent inguinal hernias.  Past Medical History:  Diagnosis Date  . COPD (chronic obstructive pulmonary disease) ([place])   . Coronary atherosclerosis    a. Treadmill Myoview 12/2015: Exercised for 8 minutes unable to achieve target heart rate, Lexiscan without significant ischemia, EF 55-65%  . GERD (gastroesophageal reflux disease)   . Inguinal hernia   . Personal history of tobacco use, presenting hazards to health 07/20/2015    Past Surgical History:  Procedure Laterality Date  . HERNIA REPAIR     bilateral inguinal   . TONSILLECTOMY      Family History  Problem Relation Age of Onset  . Stroke Mother   . Hypertension Mother   . Heart disease Father   . Hypertension Father    Social History:  reports that he quit smoking about 2 years ago. His smoking use included Cigarettes. He has a 50.00 pack-year smoking history. He has never used smokeless tobacco. He reports that he does not drink alcohol or use drugs.  Allergies:  Allergies  Allergen Reactions  . Oxycodone Nausea And Vomiting    Medications Prior to Admission  Medication Sig Dispense Refill  . aspirin 81 MG tablet Take 81 mg by mouth daily.    . tamsulosin (FLOMAX) 0.4 MG CAPS capsule Take 0.4 mg by mouth daily.    Marland Kitchen HYDROcodone-acetaminophen (NORCO) 10-325 MG tablet Take 1 tablet by mouth every 6 (six) hours as needed. 25 tablet 0  . prochlorperazine (COMPAZINE) 10 MG tablet Take 1 tablet (10 mg total) by mouth every 6 (six) hours as needed for nausea or vomiting. 20 tablet 0     Review of Systems  Constitutional: Negative.   HENT: Negative.   Eyes: Negative.   Respiratory:  Negative.   Cardiovascular: Negative.   Gastrointestinal: Negative.   Genitourinary: Negative.   Musculoskeletal: Negative.   Skin: Negative.   Neurological: Negative.   Endo/Heme/Allergies: Negative.   Psychiatric/Behavioral: Negative.      Physical Exam:  BP (!) 145/94   Pulse 69   Temp (!) 97.3 F (36.3 C) (Oral)   Resp 18   Ht 5\' 11"  (1.803 m)   Wt 158 lb (71.7 kg)   SpO2 98%   BMI 22.04 kg/m   Physical Exam  Constitutional: He is oriented to person, place, and time and well-developed, well-nourished, and in no distress.  HENT:  Head: Normocephalic and atraumatic.  Eyes: Pupils are equal, round, and reactive to light. Right eye exhibits no discharge. Left eye exhibits no discharge. No scleral icterus.  Neck: Normal range of motion.  Cardiovascular: Normal rate, regular rhythm and normal heart sounds.   Pulmonary/Chest: Effort normal. No respiratory distress. He has no wheezes.  Abdominal: Soft. He exhibits no distension. There is no tenderness. There is no rebound.  Genitourinary: Penis normal.  Genitourinary Comments: Bilateral recurrent inguinal hernias reducible and nontender  Musculoskeletal: Normal range of motion. He exhibits no edema.  Lymphadenopathy:    He has no cervical adenopathy.  Neurological: He is alert and oriented to person, place, and time.  Skin: Skin is warm and dry. No erythema.  Psychiatric: Mood and affect normal.  No results found for this or any previous visit (from the past 48 hour(s)). No results found.   Assessment/Plan  Bilateral recurrent inguinal hernias. Here for elective laparoscopic preperitoneal repair. I discussed with him the rationale for surgery the options of observation risk bleeding infection recurrence ischemic orchitis chronic pain syndrome and neuroma. I have reviewed this for him in the preop holding area he understood and agreed to proceed  Florene Glen, MD, FACS

## 2016-09-13 ENCOUNTER — Telehealth: Payer: Self-pay

## 2016-09-13 ENCOUNTER — Encounter: Payer: Self-pay | Admitting: Surgery

## 2016-09-13 NOTE — Telephone Encounter (Signed)
Post-op call made to patient at this time. Spoke with Christian Hebert. Post-op interview questions below.  1. How are you feeling? Feeling pretty good. Patient stated that he has been able to walk around well  2. Is your pain controlled? Yes  3. What are you doing for the pain? Taking otc tylenol for his pain does not care to take pain medication due to it making him feel sick  4. Are you having any Nausea or Vomiting? None  5. Are you having any Fever or Chills? None  6. Are you having any Constipation or Diarrhea? Has yet to have a bowel movement. Advised to try miarlax to help with having a bowel movement. Patient stated that he will try that tonight and also try eating a good size meal seeing that he hasn't been able to do that as well. He has been able to urinate.  7. Is there any Swelling or Bruising you are concerned about? None walking well  8. Do you have any questions or concerns at this time? Wanted to know if he could take a shower tonight and if he could remove the gauzes. I told him that he could shower but to remove the gauzes before showering pat the area dry after the shower and cover with new dressing   Discussion: Reminded patient of post op appointment on 9/17 at 10:30 with Dr. Burt Knack

## 2016-09-23 ENCOUNTER — Ambulatory Visit (INDEPENDENT_AMBULATORY_CARE_PROVIDER_SITE_OTHER): Payer: Medicare Other | Admitting: Surgery

## 2016-09-23 ENCOUNTER — Encounter: Payer: Self-pay | Admitting: Surgery

## 2016-09-23 VITALS — BP 126/81 | HR 67 | Temp 97.6°F | Ht 71.0 in | Wt 158.2 lb

## 2016-09-23 DIAGNOSIS — K4021 Bilateral inguinal hernia, without obstruction or gangrene, recurrent: Secondary | ICD-10-CM

## 2016-09-23 NOTE — Progress Notes (Signed)
Outpatient postop visit  09/23/2016  Christian Hebert is an 71 y.o. male.    Procedure: Laparoscopic repair of recurrent bilateral inguinal hernias  CC: Minimal pain  HPI: This patient underwent laparoscopic preperitoneal repair of recurrent bilateral inguinal hernias. Patient is experiencing minimal right groin pain only. No swelling but he had does have some bruising. He states that he only took half of a narcotic once in a while.  Medications reviewed.    Physical Exam:  There were no vitals taken for this visit.    PE: Minimal ecchymosis on the right side none on the left normal testicles wounds are healing well with no erythema Steri-Strips still in place.    Assessment/Plan:  This a patient with bilateral recurrent inguinal hernias. He had a repair performed laparoscopically. Patient is doing very well he had no symptoms of urinary retention afterwards and is doing quite well he will follow up on an as-needed basis  Florene Glen, MD, FACS

## 2016-09-23 NOTE — Patient Instructions (Signed)
Please call our office if you have any questions or concerns.  

## 2016-10-02 ENCOUNTER — Telehealth: Payer: Self-pay | Admitting: *Deleted

## 2016-10-02 NOTE — Telephone Encounter (Signed)
Left message for patient to notify them that it is time to schedule annual low dose lung cancer screening CT scan. Instructed patient to call back to verify information prior to the scan being scheduled.  

## 2016-10-25 ENCOUNTER — Encounter: Payer: Self-pay | Admitting: Cardiovascular Disease

## 2016-10-25 ENCOUNTER — Ambulatory Visit (INDEPENDENT_AMBULATORY_CARE_PROVIDER_SITE_OTHER): Payer: Medicare Other | Admitting: Cardiovascular Disease

## 2016-10-25 VITALS — BP 116/76 | HR 69 | Ht 71.0 in | Wt 158.5 lb

## 2016-10-25 DIAGNOSIS — E782 Mixed hyperlipidemia: Secondary | ICD-10-CM | POA: Diagnosis not present

## 2016-10-25 DIAGNOSIS — I2510 Atherosclerotic heart disease of native coronary artery without angina pectoris: Secondary | ICD-10-CM

## 2016-10-25 DIAGNOSIS — I2584 Coronary atherosclerosis due to calcified coronary lesion: Secondary | ICD-10-CM | POA: Diagnosis not present

## 2016-10-25 NOTE — Progress Notes (Signed)
Cardiology Office Note   Date:  10/25/2016   ID:  Christian Hebert, DOB 04/12/1945, MRN [id number]  PCP:  Coral Spikes, DO  Cardiologist:   Kathlyn Sacramento, MD   Chief Complaint  Patient presents with  . OTHER    3 month f/u no complaints today. Meds reviewed verbally with pt.      History of Present Illness: Christian Hebert is a 71 y.o. male who is here today for a follow-up visit regarding coronary atherosclerosis and asymptomatic bradycardia. He has known history of COPD and tobacco use.  He was noted last year to have aortic atherosclerosis  as well as calcifications in the RCA and LAD distribution on CT scan of the lungs. He underwent abdominal aortic ultrasound which showed no evidence of aortic aneurysm. He has no hypertension or diabetes. He underwent a nuclear stress test last year which was normal. He was seen recently by Thurmond Butts for preoperative cardiovascular evaluation for inguinal hernia surgery.  He was noted to be mildly bradycardic.  Treadmill nuclear stress test was normal.  48-hour Holter monitor showed normal sinus rhythm with average heart rate. The patient has been doing well and denies any chest pain or shortness of breath.  He underwent hernia surgery without complications.   Past Medical History:  Diagnosis Date  . COPD (chronic obstructive pulmonary disease) ([place])   . Coronary atherosclerosis    a. Treadmill Myoview 12/2015: Exercised for 8 minutes unable to achieve target heart rate, Lexiscan without significant ischemia, EF 55-65%  . GERD (gastroesophageal reflux disease)   . Inguinal hernia   . Personal history of tobacco use, presenting hazards to health 07/20/2015    Past Surgical History:  Procedure Laterality Date  . HERNIA REPAIR     bilateral inguinal   . INGUINAL HERNIA REPAIR Bilateral 09/12/2016   Procedure: LAPAROSCOPIC BILATERAL INGUINAL HERNIA REPAIR;  Surgeon: Florene Glen, MD;  Location: [facility] ORS;  Service: General;  Laterality:  Bilateral;  . TONSILLECTOMY       Current Outpatient Prescriptions  Medication Sig Dispense Refill  . aspirin 81 MG tablet Take 81 mg by mouth daily.     No current facility-administered medications for this visit.     Allergies:   Oxycodone    Social History:  The patient  reports that he quit smoking about 2 years ago. His smoking use included Cigarettes. He has a 50.00 pack-year smoking history. He has never used smokeless tobacco. He reports that he does not drink alcohol or use drugs.   Family History:  The patient's family history includes Heart disease in his father; Hypertension in his father and mother; Stroke in his mother.    ROS:  Please see the history of present illness.   Otherwise, review of systems are positive for none.   All other systems are reviewed and negative.    PHYSICAL EXAM: VS:  BP 116/76 (BP Location: Left Arm, Patient Position: Sitting, Cuff Size: Normal)   Pulse 69   Ht 5\' 11"  (1.803 m)   Wt 158 lb 8 oz (71.9 kg)   BMI 22.11 kg/m  , BMI Body mass index is 22.11 kg/m. GEN: Well nourished, well developed, in no acute distress  HEENT: normal  Neck: no JVD, carotid bruits, or masses Cardiac: RRR; no murmurs, rubs, or gallops,no edema  Respiratory:  clear to auscultation bilaterally, normal work of breathing GI: soft, nontender, nondistended, + BS MS: no deformity or atrophy  Skin: warm and dry, no  rash Neuro:  Strength and sensation are intact Psych: euthymic mood, full affect Distal pulses are normal.  EKG:  EKG is not ordered today.    Recent Labs: 07/16/2016: TSH 1.73 07/20/2016: ALT 14 07/26/2016: Magnesium 2.0 09/02/2016: BUN 12; Creatinine, Ser 1.39; Hemoglobin 15.4; Platelets 341; Potassium 4.5; Sodium 137    Lipid Panel    Component Value Date/Time   CHOL 162 07/16/2016 1354   TRIG 173.0 (H) 07/16/2016 1354   HDL 26.50 (L) 07/16/2016 1354   CHOLHDL 6 07/16/2016 1354   VLDL 34.6 07/16/2016 1354   LDLCALC 101 (H) 07/16/2016  1354      Wt Readings from Last 3 Encounters:  10/25/16 158 lb 8 oz (71.9 kg)  09/23/16 158 lb 3.2 oz (71.8 kg)  09/12/16 158 lb (71.7 kg)       PAD Screen 12/12/2015  Previous PAD dx? No  Previous surgical procedure? No  Pain with walking? No  Feet/toe relief with dangling? No  Painful, non-healing ulcers? No  Extremities discolored? No      ASSESSMENT AND PLAN:  1.  Coronary atherosclerosis: This was noted on CT scan of the lungs.  Negative nuclear stress test twice last year and this year.  No anginal symptoms.  Continue low-dose aspirin.  2. Borderline hyperlipidemia: He has mixed hyperlipidemia with triglyceride of 173, HDL of 26 and an LDL of 101.  Borderline indication for treatment with a statin.  I advised healthy lifestyle changes for now.  3.  Asymptomatic bradycardia: Continue to monitor clinically.  Disposition:   FU with me in 12 months.   Signed,  Kathlyn Sacramento, MD  10/25/2016 11:58 AM    [place]

## 2016-10-25 NOTE — Patient Instructions (Signed)
Medication Instructions: No change.   Labwork: None.   Procedures/Testing: None.   Follow-Up: 1 year with Dr. Fletcher Anon.   Any Additional Special Instructions Will Be Listed Below (If Applicable).     If you need a refill on your cardiac medications before your next appointment, please call your pharmacy.

## 2016-12-10 ENCOUNTER — Telehealth: Payer: Self-pay | Admitting: *Deleted

## 2016-12-10 NOTE — Telephone Encounter (Signed)
Contacted again to attempt to schedule lung screening annual recommended CT scan. Patient refuses at this time and requests to contact me if he wants to have the Ct scan in the future.

## 2017-01-28 ENCOUNTER — Encounter: Payer: Self-pay | Admitting: Family

## 2017-04-23 ENCOUNTER — Ambulatory Visit (INDEPENDENT_AMBULATORY_CARE_PROVIDER_SITE_OTHER): Payer: Medicare Other | Admitting: Family Medicine

## 2017-04-23 ENCOUNTER — Other Ambulatory Visit: Payer: Self-pay

## 2017-04-23 ENCOUNTER — Encounter: Payer: Self-pay | Admitting: Family Medicine

## 2017-04-23 VITALS — BP 118/80 | HR 65 | Temp 97.7°F | Wt 155.8 lb

## 2017-04-23 DIAGNOSIS — Z8719 Personal history of other diseases of the digestive system: Secondary | ICD-10-CM | POA: Diagnosis not present

## 2017-04-23 DIAGNOSIS — R7303 Prediabetes: Secondary | ICD-10-CM

## 2017-04-23 DIAGNOSIS — I2510 Atherosclerotic heart disease of native coronary artery without angina pectoris: Secondary | ICD-10-CM

## 2017-04-23 DIAGNOSIS — R197 Diarrhea, unspecified: Secondary | ICD-10-CM | POA: Diagnosis not present

## 2017-04-23 DIAGNOSIS — R229 Localized swelling, mass and lump, unspecified: Secondary | ICD-10-CM | POA: Insufficient documentation

## 2017-04-23 DIAGNOSIS — E7800 Pure hypercholesterolemia, unspecified: Secondary | ICD-10-CM

## 2017-04-23 DIAGNOSIS — E782 Mixed hyperlipidemia: Secondary | ICD-10-CM | POA: Diagnosis not present

## 2017-04-23 DIAGNOSIS — I2584 Coronary atherosclerosis due to calcified coronary lesion: Secondary | ICD-10-CM | POA: Diagnosis not present

## 2017-04-23 DIAGNOSIS — Z9889 Other specified postprocedural states: Secondary | ICD-10-CM | POA: Diagnosis not present

## 2017-04-23 LAB — COMPREHENSIVE METABOLIC PANEL
ALT: 12 U/L (ref 0–53)
AST: 14 U/L (ref 0–37)
Albumin: 3.9 g/dL (ref 3.5–5.2)
Alkaline Phosphatase: 67 U/L (ref 39–117)
BILIRUBIN TOTAL: 0.4 mg/dL (ref 0.2–1.2)
BUN: 14 mg/dL (ref 6–23)
CHLORIDE: 105 meq/L (ref 96–112)
CO2: 24 meq/L (ref 19–32)
CREATININE: 1.32 mg/dL (ref 0.40–1.50)
Calcium: 9.1 mg/dL (ref 8.4–10.5)
GFR: 56.62 mL/min — ABNORMAL LOW (ref >60.00)
GLUCOSE: 89 mg/dL (ref 70–99)
Potassium: 4.1 mEq/L (ref 3.5–5.1)
SODIUM: 136 meq/L (ref 135–145)
Total Protein: 7.2 g/dL (ref 6.0–8.3)

## 2017-04-23 LAB — LDL CHOLESTEROL, DIRECT: Direct LDL: 106 mg/dL

## 2017-04-23 LAB — HEMOGLOBIN A1C: Hgb A1c MFr Bld: 5.9 % (ref 4.6–6.5)

## 2017-04-23 NOTE — Assessment & Plan Note (Signed)
Recheck A1c 

## 2017-04-23 NOTE — Progress Notes (Signed)
  [name], MD Phone: [phone]  Christian Hebert is a 72 y.o. male who presents today for follow-up.  Diagnosed with prediabetes previously.  No polyuria or polydipsia. He follows with cardiology for coronary calcification seen on prior CT imaging.  No chest pain or shortness of breath.  He tries to eat healthy and does stay active.  He has noted a knot on his thoracic back around the midline of his spine.  Notes it does not hurt.  It is freely mobile.  Has not been evaluated.  Possibly has been there about a month or more.  He reports he had his bilateral inguinal hernias repaired previously and has had no issues.  He reports intermittent loose stools and diarrhea in the morning for the last several weeks.  Occasionally has a normal bowel movement later in the day.  Occasionally he will pass hard little stools.  No blood in his stool.  No abdominal pain.  No nausea or vomiting.  Has no change to his diet.  No new medications.  No sick contacts.  Social History   Tobacco Use  Smoking Status Former Smoker  . Packs/day: 1.00  . Years: 50.00  . Pack years: 50.00  . Types: Cigarettes  . Last attempt to quit: 01/07/2014  . Years since quitting: 3.2  Smokeless Tobacco Never Used     ROS see history of present illness  Objective  Physical Exam Vitals:   04/23/17 1305  BP: 118/80  Pulse: 65  Temp: 97.7 F (36.5 C)  SpO2: 98%    BP Readings from Last 3 Encounters:  04/23/17 118/80  10/25/16 116/76  09/23/16 126/81   Wt Readings from Last 3 Encounters:  04/23/17 155 lb 12.8 oz (70.7 kg)  10/25/16 158 lb 8 oz (71.9 kg)  09/23/16 158 lb 3.2 oz (71.8 kg)    Physical Exam  Constitutional: No distress.  Neck:    Cardiovascular: Normal rate, regular rhythm and normal heart sounds.  Pulmonary/Chest: Effort normal and breath sounds normal.  Abdominal: Soft. Bowel sounds are normal. He exhibits no distension. There is no tenderness.  Musculoskeletal: He exhibits no  edema.  Neurological: He is alert.  Skin: Skin is warm and dry. He is not diaphoretic.     Assessment/Plan: Please see individual problem list.  Coronary artery calcification Follows with cardiology.   Prediabetes Recheck A1c.  Hyperlipidemia He will stay active.  Recheck LDL.  Status post bilateral hernia repair Doing well.  Monitor for recurrence.  Skin nodule Suspect skin cyst.  Will refer to dermatology for potential removal.  Diarrhea Undetermined cause.  Less likely infectious given persistence though we will obtain a GI pathogen panel to evaluate for this.  If negative would consider dietary changes or referral to GI.   Orders Placed This Encounter  Procedures  . Comp Met (CMET)  . HgB A1c  . Direct LDL  . Gastrointestinal Pathogen Panel PCR    Standing Status:   Future    Standing Expiration Date:   04/24/2018  . Ambulatory referral to Dermatology    Referral Priority:   Routine    Referral Type:   Consultation    Referral Reason:   Specialty Services Required    Requested Specialty:   Dermatology    Number of Visits Requested:   1    No orders of the defined types were placed in this encounter.    [name], MD [place]

## 2017-04-23 NOTE — Assessment & Plan Note (Signed)
He will stay active.  Recheck LDL.

## 2017-04-23 NOTE — Assessment & Plan Note (Signed)
Suspect skin cyst.  Will refer to dermatology for potential removal.

## 2017-04-23 NOTE — Assessment & Plan Note (Signed)
Undetermined cause.  Less likely infectious given persistence though we will obtain a GI pathogen panel to evaluate for this.  If negative would consider dietary changes or referral to GI.

## 2017-04-23 NOTE — Assessment & Plan Note (Signed)
Doing well.  Monitor for recurrence.

## 2017-04-23 NOTE — Patient Instructions (Signed)
Nice to see you. We will get lab work today and contact you with the results. We will provide you with stool collection materials so we can run stool studies. We will get you to see dermatology for the knot on your back.

## 2017-04-23 NOTE — Assessment & Plan Note (Addendum)
Follows with cardiology 

## 2017-05-12 DIAGNOSIS — L280 Lichen simplex chronicus: Secondary | ICD-10-CM | POA: Diagnosis not present

## 2017-05-12 DIAGNOSIS — L708 Other acne: Secondary | ICD-10-CM | POA: Diagnosis not present

## 2017-05-12 DIAGNOSIS — R21 Rash and other nonspecific skin eruption: Secondary | ICD-10-CM | POA: Diagnosis not present

## 2017-05-12 DIAGNOSIS — L812 Freckles: Secondary | ICD-10-CM | POA: Diagnosis not present

## 2017-05-12 DIAGNOSIS — L821 Other seborrheic keratosis: Secondary | ICD-10-CM | POA: Diagnosis not present

## 2017-05-12 DIAGNOSIS — D225 Melanocytic nevi of trunk: Secondary | ICD-10-CM | POA: Diagnosis not present

## 2017-05-12 DIAGNOSIS — L918 Other hypertrophic disorders of the skin: Secondary | ICD-10-CM | POA: Diagnosis not present

## 2017-05-12 DIAGNOSIS — L720 Epidermal cyst: Secondary | ICD-10-CM | POA: Diagnosis not present

## 2017-07-02 DIAGNOSIS — R229 Localized swelling, mass and lump, unspecified: Secondary | ICD-10-CM | POA: Diagnosis not present

## 2017-07-02 DIAGNOSIS — R21 Rash and other nonspecific skin eruption: Secondary | ICD-10-CM | POA: Diagnosis not present

## 2017-07-02 DIAGNOSIS — E854 Organ-limited amyloidosis: Secondary | ICD-10-CM | POA: Diagnosis not present

## 2017-07-02 DIAGNOSIS — L99 Other disorders of skin and subcutaneous tissue in diseases classified elsewhere: Secondary | ICD-10-CM | POA: Diagnosis not present

## 2017-07-16 ENCOUNTER — Ambulatory Visit: Payer: Medicare Other

## 2017-07-17 ENCOUNTER — Other Ambulatory Visit: Payer: Self-pay

## 2017-07-17 ENCOUNTER — Emergency Department: Payer: Medicare Other

## 2017-07-17 ENCOUNTER — Emergency Department
Admission: EM | Admit: 2017-07-17 | Discharge: 2017-07-17 | Disposition: A | Discharge status: Home / Self Care | Payer: Medicare Other | Attending: Emergency Medicine | Admitting: Emergency Medicine

## 2017-07-17 DIAGNOSIS — I2510 Atherosclerotic heart disease of native coronary artery without angina pectoris: Secondary | ICD-10-CM | POA: Diagnosis not present

## 2017-07-17 DIAGNOSIS — J449 Chronic obstructive pulmonary disease, unspecified: Secondary | ICD-10-CM | POA: Diagnosis not present

## 2017-07-17 DIAGNOSIS — Z7982 Long term (current) use of aspirin: Secondary | ICD-10-CM | POA: Insufficient documentation

## 2017-07-17 DIAGNOSIS — K5792 Diverticulitis of intestine, part unspecified, without perforation or abscess without bleeding: Secondary | ICD-10-CM

## 2017-07-17 DIAGNOSIS — Z87891 Personal history of nicotine dependence: Secondary | ICD-10-CM | POA: Insufficient documentation

## 2017-07-17 DIAGNOSIS — E785 Hyperlipidemia, unspecified: Secondary | ICD-10-CM | POA: Diagnosis not present

## 2017-07-17 DIAGNOSIS — K5730 Diverticulosis of large intestine without perforation or abscess without bleeding: Secondary | ICD-10-CM | POA: Diagnosis not present

## 2017-07-17 DIAGNOSIS — K5732 Diverticulitis of large intestine without perforation or abscess without bleeding: Secondary | ICD-10-CM | POA: Insufficient documentation

## 2017-07-17 DIAGNOSIS — M545 Low back pain: Secondary | ICD-10-CM | POA: Diagnosis present

## 2017-07-17 LAB — BASIC METABOLIC PANEL
ANION GAP: 10 (ref 5–15)
BUN: 14 mg/dL (ref 8–23)
CHLORIDE: 106 mmol/L (ref 98–111)
CO2: 20 mmol/L — AB (ref 22–32)
Calcium: 8.8 mg/dL — ABNORMAL LOW (ref 8.9–10.3)
Creatinine, Ser: 1.39 mg/dL — ABNORMAL HIGH (ref 0.61–1.24)
GFR calc Af Amer: 57 mL/min — ABNORMAL LOW (ref >60)
GFR calc non Af Amer: 49 mL/min — ABNORMAL LOW (ref >60)
GLUCOSE: 117 mg/dL — AB (ref 70–99)
POTASSIUM: 4 mmol/L (ref 3.5–5.1)
Sodium: 136 mmol/L (ref 135–145)

## 2017-07-17 LAB — CBC
HEMATOCRIT: 45.3 % (ref 40.0–52.0)
HEMOGLOBIN: 16 g/dL (ref 13.0–18.0)
MCH: 31.7 pg (ref 26.0–34.0)
MCHC: 35.2 g/dL (ref 32.0–36.0)
MCV: 89.9 fL (ref 80.0–100.0)
Platelets: 307 10*3/uL (ref 150–440)
RBC: 5.05 MIL/uL (ref 4.40–5.90)
RDW: 13.1 % (ref 11.5–14.5)
WBC: 11.4 10*3/uL — ABNORMAL HIGH (ref 3.8–10.6)

## 2017-07-17 LAB — URINALYSIS, COMPLETE (UACMP) WITH MICROSCOPIC
Bilirubin Urine: NEGATIVE
GLUCOSE, UA: NEGATIVE mg/dL
Ketones, ur: NEGATIVE mg/dL
Leukocytes, UA: NEGATIVE
Nitrite: NEGATIVE
PH: 5 (ref 5.0–8.0)
Protein, ur: NEGATIVE mg/dL
SPECIFIC GRAVITY, URINE: 1.015 (ref 1.005–1.030)
Squamous Epithelial / LPF: NONE SEEN (ref 0–5)

## 2017-07-17 MED ORDER — METRONIDAZOLE 500 MG PO TABS: 500.0000 mg | ORAL_TABLET | Freq: Three times a day (TID) | ORAL | 0 refills | Status: AC

## 2017-07-17 MED ORDER — CIPROFLOXACIN HCL 500 MG PO TABS: 500.0000 mg | ORAL_TABLET | Freq: Two times a day (BID) | ORAL | 0 refills | Status: AC

## 2017-07-17 MED ORDER — CIPROFLOXACIN HCL 500 MG PO TABS
500.0000 mg | ORAL_TABLET | Freq: Once | ORAL | Status: AC
  Administered 2017-07-17: 500 mg via ORAL
  Filled 2017-07-17: qty 1

## 2017-07-17 MED ORDER — METRONIDAZOLE 500 MG PO TABS: 500.0000 mg | ORAL_TABLET | Freq: Two times a day (BID) | ORAL | 0 refills | Status: AC

## 2017-07-17 MED ORDER — METRONIDAZOLE 500 MG PO TABS
500.0000 mg | ORAL_TABLET | Freq: Once | ORAL | Status: AC
  Administered 2017-07-17: 500 mg via ORAL
  Filled 2017-07-17: qty 1

## 2017-07-17 NOTE — ED Triage Notes (Signed)
Pt arrives with c/o lower L back pain that goes around to L groin. States pain began this morning. Denies hx of kidney stones. Denies urinary symptoms. States went to bathroom, which was normal, when he got up felt a twinge in back and states pain has increased during day.   Alert, oriented, appears uncomfortable. Family with pt.

## 2017-07-17 NOTE — ED Provider Notes (Signed)
[facility] Emergency Department Provider Note ____________________________________________   First MD Initiated Contact with Patient 07/17/17 1721     (approximate)  I have reviewed the triage vital signs and the nursing notes.   HISTORY  Chief Complaint Flank Pain and Groin Pain  HPI Christian Hebert is a 72 y.o. male with a history of COPD as well as an inguinal hernia who is presenting to the emergency department today with left lower back pain that started this morning while he was moving his bowels.  He says that the pain is been radiating down into his left testicle as well as the proximal left lower extremity.  Says the pain feels like someone is turning a screwdriver into his back.  No nausea or vomiting.  No fever.  Denies any known history of kidney stones in his family.  Patient says that the pain is positional and hurts worse if he is standing or moving.  Denies any pain at this time while lying flat.  Past Medical History:  Diagnosis Date  . COPD (chronic obstructive pulmonary disease) ([place])   . Coronary atherosclerosis    a. Treadmill Myoview 12/2015: Exercised for 8 minutes unable to achieve target heart rate, Lexiscan without significant ischemia, EF 55-65%  . GERD (gastroesophageal reflux disease)   . Inguinal hernia   . Personal history of tobacco use, presenting hazards to health 07/20/2015    Patient Active Problem List   Diagnosis Date Noted  . Skin nodule 04/23/2017  . Diarrhea 04/23/2017  . Status post bilateral hernia repair   . Arthritis of knee 07/23/2016  . Prediabetes 07/16/2016  . Hyperlipidemia 07/16/2016  . COPD (chronic obstructive pulmonary disease) ([place]) 10/13/2015  . Aortic atherosclerosis ([place]) 10/13/2015  . Coronary artery calcification 10/13/2015  . Hearing loss 07/05/2015  . Former smoker 07/05/2015    Past Surgical History:  Procedure Laterality Date  . HERNIA REPAIR     bilateral inguinal   . INGUINAL  HERNIA REPAIR Bilateral 09/12/2016   Procedure: LAPAROSCOPIC BILATERAL INGUINAL HERNIA REPAIR;  Surgeon: Florene Glen, MD;  Location: [facility] ORS;  Service: General;  Laterality: Bilateral;  . TONSILLECTOMY      Prior to Admission medications   Medication Sig Start Date End Date Taking? Authorizing Provider  aspirin 81 MG tablet Take 81 mg by mouth daily.    [provider]    Allergies Oxycodone  Family History  Problem Relation Age of Onset  . Stroke Mother   . Hypertension Mother   . Heart disease Father   . Hypertension Father     Social History Social History   Tobacco Use  . Smoking status: Former Smoker    Packs/day: 1.00    Years: 50.00    Pack years: 50.00    Types: Cigarettes    Last attempt to quit: 01/07/2014    Years since quitting: 3.5  . Smokeless tobacco: Never Used  Substance Use Topics  . Alcohol use: No    Alcohol/week: 0.0 oz  . Drug use: No    Review of Systems  Constitutional: No fever/chills Eyes: No visual changes. ENT: No sore throat. Cardiovascular: Denies chest pain. Respiratory: Denies shortness of breath. Gastrointestinal:   No nausea, no vomiting.  No diarrhea.  No constipation. Genitourinary: Negative for dysuria. Musculoskeletal: as above Skin: Negative for rash. Neurological: Negative for headaches, focal weakness or numbness.   ____________________________________________   PHYSICAL EXAM:  VITAL SIGNS: ED Triage Vitals [07/17/17 1657]  Enc Vitals Group  BP 126/84     Pulse Rate 82     Resp 18     Temp 98.2 F (36.8 C)     Temp Source Oral     SpO2 93 %     Weight 155 lb (70.3 kg)     Height 5\' 11"  (1.803 m)     Head Circumference      Peak Flow      Pain Score 8     Pain Loc      Pain Edu?      Excl. in [place]?     Constitutional: Alert and oriented. Well appearing and in no acute distress. Eyes: Conjunctivae are normal.  Head: Atraumatic. Nose: No congestion/rhinnorhea. Mouth/Throat: Mucous  membranes are moist.  Neck: No stridor.   Cardiovascular: Normal rate, regular rhythm. Grossly normal heart sounds.   Respiratory: Normal respiratory effort.  No retractions. Lungs CTAB. Gastrointestinal: Soft and nontender. No distention. No CVA tenderness. Musculoskeletal: No lower extremity tenderness nor edema.  No joint effusions. Neurologic:  Normal speech and language. No gross focal neurologic deficits are appreciated. Skin:  Skin is warm, dry and intact. No rash noted. Psychiatric: Mood and affect are normal. Speech and behavior are normal.  ____________________________________________   LABS (all labs ordered are listed, but only abnormal results are displayed)  Labs Reviewed  URINALYSIS, COMPLETE (UACMP) WITH MICROSCOPIC - Abnormal; Notable for the following components:      Result Value   Color, Urine YELLOW (*)    APPearance CLEAR (*)    Hgb urine dipstick SMALL (*)    Bacteria, UA RARE (*)    All other components within normal limits  BASIC METABOLIC PANEL - Abnormal; Notable for the following components:   CO2 20 (*)    Glucose, Bld 117 (*)    Creatinine, Ser 1.39 (*)    Calcium 8.8 (*)    GFR calc non Af Amer 49 (*)    GFR calc Af Amer 57 (*)    All other components within normal limits  CBC - Abnormal; Notable for the following components:   WBC 11.4 (*)    All other components within normal limits   ____________________________________________  EKG  ____________________________________________  RADIOLOGY  Mild distal descending and sigmoid diverticulitis without comp thickening features. ____________________________________________   PROCEDURES  Procedure(s) performed:   Procedures  Critical Care performed:   ____________________________________________   INITIAL IMPRESSION / ASSESSMENT AND PLAN / ED COURSE  Pertinent labs & imaging results that were available during my care of the patient were reviewed by me and considered in my medical  decision making (see chart for details).  Differential diagnosis includes, but is not limited to, acute appendicitis, renal colic, testicular torsion, urinary tract infection/pyelonephritis, prostatitis,  epididymitis, diverticulitis, small bowel obstruction or ileus, colitis, abdominal aortic aneurysm, gastroenteritis, hernia, etc. As part of my medical decision making, I reviewed the following data within the electronic MEDICAL RECORD NUMBER Notes from prior ED visits  ----------------------------------------- 6:46 PM on 07/17/2017 -----------------------------------------  Patient at this time without any distress.  Diverticulitis in the distal sigmoid colon correlates with the patient's symptoms.  Will be discharged on Cipro and Flagyl.  Given return precautions.  Patient understanding of the plan as well as the diagnosis and willing to comply.  We fully reviewed the CT scan and discussed the old herniorrhaphies as well. ____________________________________________   FINAL CLINICAL IMPRESSION(S) / ED DIAGNOSES  Diverticulitis   NEW MEDICATIONS STARTED DURING THIS VISIT:  New Prescriptions  No medications on file     Note:  This document was prepared using Dragon voice recognition software and may include unintentional dictation errors.     Orbie Pyo, MD 07/17/17 [phone]

## 2017-07-17 NOTE — ED Notes (Signed)
Pt states he used the bathroom this am and after standing started having sharp pains radiating from the left lower back down into the groin. Pt denied any pain or difficulties with urination

## 2017-07-31 ENCOUNTER — Ambulatory Visit: Payer: Medicare Other | Admitting: Family

## 2017-08-14 ENCOUNTER — Telehealth: Payer: Self-pay | Admitting: *Deleted

## 2017-08-14 NOTE — Telephone Encounter (Signed)
Received from [employer] request for history and physical, office notes, diagnostic test reports and procedure reports. This is to help regarding the claim for the patient.   Office note from 07/24/17, zio results and myoview results faxed to [phone].

## 2017-09-02 ENCOUNTER — Encounter: Payer: Self-pay | Admitting: Family Medicine

## 2017-09-02 ENCOUNTER — Ambulatory Visit (INDEPENDENT_AMBULATORY_CARE_PROVIDER_SITE_OTHER): Payer: Medicare Other | Admitting: Family Medicine

## 2017-09-02 VITALS — BP 106/66 | HR 54 | Temp 98.1°F | Wt 158.4 lb

## 2017-09-02 DIAGNOSIS — I2510 Atherosclerotic heart disease of native coronary artery without angina pectoris: Secondary | ICD-10-CM

## 2017-09-02 DIAGNOSIS — N509 Disorder of male genital organs, unspecified: Secondary | ICD-10-CM | POA: Insufficient documentation

## 2017-09-02 DIAGNOSIS — Z125 Encounter for screening for malignant neoplasm of prostate: Secondary | ICD-10-CM

## 2017-09-02 DIAGNOSIS — I2584 Coronary atherosclerosis due to calcified coronary lesion: Secondary | ICD-10-CM | POA: Diagnosis not present

## 2017-09-02 DIAGNOSIS — R1032 Left lower quadrant pain: Secondary | ICD-10-CM

## 2017-09-02 DIAGNOSIS — J439 Emphysema, unspecified: Secondary | ICD-10-CM | POA: Diagnosis not present

## 2017-09-02 DIAGNOSIS — N401 Enlarged prostate with lower urinary tract symptoms: Secondary | ICD-10-CM | POA: Diagnosis not present

## 2017-09-02 DIAGNOSIS — R351 Nocturia: Secondary | ICD-10-CM | POA: Diagnosis not present

## 2017-09-02 DIAGNOSIS — N489 Disorder of penis, unspecified: Secondary | ICD-10-CM | POA: Diagnosis not present

## 2017-09-02 DIAGNOSIS — N50819 Testicular pain, unspecified: Secondary | ICD-10-CM

## 2017-09-02 DIAGNOSIS — N400 Enlarged prostate without lower urinary tract symptoms: Secondary | ICD-10-CM | POA: Insufficient documentation

## 2017-09-02 DIAGNOSIS — R1030 Lower abdominal pain, unspecified: Secondary | ICD-10-CM | POA: Insufficient documentation

## 2017-09-02 LAB — COMPREHENSIVE METABOLIC PANEL
ALK PHOS: 73 U/L (ref 39–117)
ALT: 12 U/L (ref 0–53)
AST: 14 U/L (ref 0–37)
Albumin: 4.1 g/dL (ref 3.5–5.2)
BILIRUBIN TOTAL: 0.4 mg/dL (ref 0.2–1.2)
BUN: 13 mg/dL (ref 6–23)
CALCIUM: 9.8 mg/dL (ref 8.4–10.5)
CO2: 25 meq/L (ref 19–32)
Chloride: 105 mEq/L (ref 96–112)
Creatinine, Ser: 1.29 mg/dL (ref 0.40–1.50)
GFR: 58.08 mL/min — ABNORMAL LOW (ref >60.00)
GLUCOSE: 97 mg/dL (ref 70–99)
Potassium: 4.5 mEq/L (ref 3.5–5.1)
Sodium: 136 mEq/L (ref 135–145)
TOTAL PROTEIN: 7.3 g/dL (ref 6.0–8.3)

## 2017-09-02 LAB — CBC WITH DIFFERENTIAL/PLATELET
Basophils Absolute: 0.2 10*3/uL — ABNORMAL HIGH (ref 0.0–0.1)
Basophils Relative: 1.4 % (ref 0.0–3.0)
EOS ABS: 0.1 10*3/uL (ref 0.0–0.7)
Eosinophils Relative: 0.8 % (ref 0.0–5.0)
HEMATOCRIT: 48 % (ref 39.0–52.0)
Hemoglobin: 16.2 g/dL (ref 13.0–17.0)
LYMPHS PCT: 12 % (ref 12.0–46.0)
Lymphs Abs: 1.4 10*3/uL (ref 0.7–4.0)
MCHC: 33.7 g/dL (ref 30.0–36.0)
MCV: 89.9 fl (ref 78.0–100.0)
MONOS PCT: 12.6 % — AB (ref 3.0–12.0)
Monocytes Absolute: 1.4 10*3/uL — ABNORMAL HIGH (ref 0.1–1.0)
NEUTROS ABS: 8.4 10*3/uL — AB (ref 1.4–7.7)
NEUTROS PCT: 73.2 % (ref 43.0–77.0)
PLATELETS: 323 10*3/uL (ref 150.0–400.0)
RBC: 5.34 Mil/uL (ref 4.22–5.81)
RDW: 13.6 % (ref 11.5–15.5)
WBC: 11.5 10*3/uL — ABNORMAL HIGH (ref 4.0–10.5)

## 2017-09-02 LAB — PSA, MEDICARE: PSA: 1.08 ng/ml (ref 0.10–4.00)

## 2017-09-02 NOTE — Assessment & Plan Note (Signed)
Mild nocturia.  Calcifications noted on CT scan.  Will obtain a PSA.  Refer to urology.

## 2017-09-02 NOTE — Progress Notes (Signed)
[name], MD Phone: [phone]  Christian Hebert is a 72 y.o. male who presents today for f/u.  CC: diverticulitis, BPH, testicle discomfort, copd  Patient was evaluated in the hospital for left lower quadrant and left low back discomfort radiating down to his left testicle.  He underwent a CT scan which revealed likely diverticulitis.  The took most of the ciprofloxacin and Flagyl although he developed upset stomach.  His CBC revealed a mildly elevated WBC.  Still has some discomfort in his left lower quadrant as well as left flank going to his left testicle.  Notes it is improved though is still there.  He has had no fevers.  No nausea, vomiting, or diarrhea.  No dysuria.  No injury to his testicles.  CT scan also revealed enlarged prostate with calcifications.  He has had no urinary flow issues, dysuria, frequency, or emptying issues.  He does note nocturia x2.  Prior PSA in the normal range.  COPD: He rarely smokes now.  No cough, wheezing, or shortness of breath.  Social History   Tobacco Use  Smoking Status Former Smoker  . Packs/day: 1.00  . Years: 50.00  . Pack years: 50.00  . Types: Cigarettes  . Last attempt to quit: 01/07/2014  . Years since quitting: 3.6  Smokeless Tobacco Never Used     ROS see history of present illness  Objective  Physical Exam Vitals:   09/02/17 0848  BP: 106/66  Pulse: (!) 54  Temp: 98.1 F (36.7 C)  SpO2: 95%    BP Readings from Last 3 Encounters:  09/02/17 106/66  07/17/17 (!) 151/83  04/23/17 118/80   Wt Readings from Last 3 Encounters:  09/02/17 158 lb 6.4 oz (71.8 kg)  07/17/17 155 lb (70.3 kg)  04/23/17 155 lb 12.8 oz (70.7 kg)    Physical Exam  Constitutional: No distress.  Cardiovascular: Normal rate, regular rhythm and normal heart sounds.  Pulmonary/Chest: Effort normal and breath sounds normal.  Abdominal: Soft. Bowel sounds are normal. He exhibits no distension. There is tenderness (mild in LLQ). There is no  rebound and no guarding.  Genitourinary:  Genitourinary Comments: Uncircumcised penis with two 1-2 mm flat brown spots on the left glans noted on retraction of the foreskin, no palpable abnormalities, left testicle mildly tender with no swelling, left testicle oriented vertically, normal epididymis, normal vas deferens, no inguinal hernia, right testicle with no tenderness or swelling, oriented vertically, normal epididymis, normal vas deferens  Musculoskeletal: He exhibits no edema.  Neurological: He is alert.  Skin: Skin is warm and dry. He is not diaphoretic.     Assessment/Plan: Please see individual problem list.  Persistent testicular pain Patient with 6 weeks of testicular discomfort associated with left lower quadrant discomfort.  He does note tenderness on his left testicle exam today.  There are no palpable abnormalities.  We will start with an ultrasound of his testicles and then determine the next step in management.  We will be referring to urology for an alternative reason and they may be able to with this as well.  Penile lesion Patient with 2 small flat brown spots on the left aspect of his glands noted on retraction of the foreskin.  He is unsure how long these have been present.  Potentially they could be penile melanosis.  They do not appear worrisome though should be evaluated by urology.  A referral has been placed for evaluation of this.  LLQ pain Patient notes continued mild discomfort in his  left lower quadrant that is been present since he was treated for diverticulitis in July.  It is improved though still does bother him some.  There are no peritoneal signs.  The patient is well-appearing.  It would seem odd for this to represent persistent infection given that he has not worsened.  It is associated with his left testicular discomfort and may be related to that.  Discussed starting with a CBC to evaluate for elevated WBC.  If elevated I discussed that we potentially could  treat with antibiotics versus repeat a CT scan.  If WBC is not elevated we will see what his testicular ultrasound reveals and then determine if he needs a CT scan of his abdomen and pelvis again.  Return precautions in AVS.  COPD (chronic obstructive pulmonary disease) ([place]) Asymptomatic on no medications.  He will monitor and if he develops symptoms we could consider treatment.  BPH (benign prostatic hyperplasia) Mild nocturia.  Calcifications noted on CT scan.  Will obtain a PSA.  Refer to urology.    Orders Placed This Encounter  Procedures  . US SCROTUM W/DOPPLER    Standing Status:   Future    Standing Expiration Date:   11/03/2018    Order Specific Question:   Reason for Exam (SYMPTOM  OR DIAGNOSIS REQUIRED)    Answer:   left testicle pain and tenderness on exam, present for 6 weeks, radiates to left inguinal area, no injury    Order Specific Question:   Preferred imaging location?    Answer:   [facility]  . CBC w/Diff  . Comp Met (CMET)  . PSA, Medicare  . Ambulatory referral to Urology    Referral Priority:   Routine    Referral Type:   Consultation    Referral Reason:   Specialty Services Required    Requested Specialty:   Urology    Number of Visits Requested:   1    No orders of the defined types were placed in this encounter.    [name], MD [place]

## 2017-09-02 NOTE — Patient Instructions (Signed)
Nice to see you. We will get lab work today to help determine the next step. We will get an ultrasound of your testicles as well to evaluate.  If you develop increasing pain in your stomach or testicles or you develop blood in your stool, diarrhea, or fevers please seek medical attention immediately.

## 2017-09-02 NOTE — Assessment & Plan Note (Signed)
Patient notes continued mild discomfort in his left lower quadrant that is been present since he was treated for diverticulitis in July.  It is improved though still does bother him some.  There are no peritoneal signs.  The patient is well-appearing.  It would seem odd for this to represent persistent infection given that he has not worsened.  It is associated with his left testicular discomfort and may be related to that.  Discussed starting with a CBC to evaluate for elevated WBC.  If elevated I discussed that we potentially could treat with antibiotics versus repeat a CT scan.  If WBC is not elevated we will see what his testicular ultrasound reveals and then determine if he needs a CT scan of his abdomen and pelvis again.  Return precautions in AVS.

## 2017-09-02 NOTE — Assessment & Plan Note (Signed)
Asymptomatic on no medications.  He will monitor and if he develops symptoms we could consider treatment.

## 2017-09-02 NOTE — Assessment & Plan Note (Signed)
Patient with 6 weeks of testicular discomfort associated with left lower quadrant discomfort.  He does note tenderness on his left testicle exam today.  There are no palpable abnormalities.  We will start with an ultrasound of his testicles and then determine the next step in management.  We will be referring to urology for an alternative reason and they may be able to with this as well.

## 2017-09-02 NOTE — Assessment & Plan Note (Signed)
Patient with 2 small flat brown spots on the left aspect of his glands noted on retraction of the foreskin.  He is unsure how long these have been present.  Potentially they could be penile melanosis.  They do not appear worrisome though should be evaluated by urology.  A referral has been placed for evaluation of this.

## 2017-09-03 ENCOUNTER — Ambulatory Visit
Admission: RE | Admit: 2017-09-03 | Discharge: 2017-09-03 | Disposition: A | Discharge status: Home / Self Care | Payer: Medicare Other | Source: Ambulatory Visit | Attending: Family Medicine | Admitting: Family Medicine

## 2017-09-03 DIAGNOSIS — N50819 Testicular pain, unspecified: Secondary | ICD-10-CM

## 2017-09-03 DIAGNOSIS — N509 Disorder of male genital organs, unspecified: Secondary | ICD-10-CM | POA: Diagnosis not present

## 2017-09-03 DIAGNOSIS — L729 Follicular cyst of the skin and subcutaneous tissue, unspecified: Secondary | ICD-10-CM | POA: Insufficient documentation

## 2017-09-03 DIAGNOSIS — N50812 Left testicular pain: Secondary | ICD-10-CM | POA: Diagnosis not present

## 2017-09-04 ENCOUNTER — Other Ambulatory Visit: Payer: Self-pay | Admitting: Family Medicine

## 2017-09-04 MED ORDER — AMOXICILLIN-POT CLAVULANATE 875-125 MG PO TABS: 1.0000 | ORAL_TABLET | Freq: Three times a day (TID) | ORAL | 0 refills | Status: DC

## 2017-09-09 ENCOUNTER — Encounter: Payer: Self-pay | Admitting: Family Medicine

## 2017-09-09 ENCOUNTER — Other Ambulatory Visit: Payer: Self-pay | Admitting: Family Medicine

## 2017-09-09 ENCOUNTER — Ambulatory Visit (INDEPENDENT_AMBULATORY_CARE_PROVIDER_SITE_OTHER): Payer: Medicare Other | Admitting: Family Medicine

## 2017-09-09 VITALS — BP 140/80 | HR 60 | Temp 97.9°F | Resp 16 | Wt 157.4 lb

## 2017-09-09 DIAGNOSIS — I2510 Atherosclerotic heart disease of native coronary artery without angina pectoris: Secondary | ICD-10-CM

## 2017-09-09 DIAGNOSIS — D72829 Elevated white blood cell count, unspecified: Secondary | ICD-10-CM | POA: Diagnosis not present

## 2017-09-09 DIAGNOSIS — I2584 Coronary atherosclerosis due to calcified coronary lesion: Secondary | ICD-10-CM | POA: Diagnosis not present

## 2017-09-09 DIAGNOSIS — R1032 Left lower quadrant pain: Secondary | ICD-10-CM | POA: Diagnosis not present

## 2017-09-09 LAB — CBC WITH DIFFERENTIAL/PLATELET
BASOS PCT: 1.3 % (ref 0.0–3.0)
Basophils Absolute: 0.1 10*3/uL (ref 0.0–0.1)
EOS ABS: 0.1 10*3/uL (ref 0.0–0.7)
EOS PCT: 0.9 % (ref 0.0–5.0)
HCT: 47.8 % (ref 39.0–52.0)
HEMOGLOBIN: 16.3 g/dL (ref 13.0–17.0)
Lymphocytes Relative: 14.6 % (ref 12.0–46.0)
Lymphs Abs: 1.6 10*3/uL (ref 0.7–4.0)
MCHC: 34.1 g/dL (ref 30.0–36.0)
MCV: 89.5 fl (ref 78.0–100.0)
MONO ABS: 1.4 10*3/uL — AB (ref 0.1–1.0)
Monocytes Relative: 13.2 % — ABNORMAL HIGH (ref 3.0–12.0)
NEUTROS PCT: 70 % (ref 43.0–77.0)
Neutro Abs: 7.5 10*3/uL (ref 1.4–7.7)
Platelets: 316 10*3/uL (ref 150.0–400.0)
RBC: 5.34 Mil/uL (ref 4.22–5.81)
RDW: 13.5 % (ref 11.5–15.5)
WBC: 10.6 10*3/uL — AB (ref 4.0–10.5)

## 2017-09-09 NOTE — Assessment & Plan Note (Signed)
Mildly elevated previously possibly in the setting of infection though has been mildly elevated in the past.  We will recheck today.  If persistently elevated he may need to see hematology.

## 2017-09-09 NOTE — Patient Instructions (Signed)
Nice to see you. Glad you are feeling better. We will have you complete your antibiotics and have you rechecked next week. If your discomfort worsens or you develop fevers or any new symptoms please be reevaluated immediately.

## 2017-09-09 NOTE — Progress Notes (Signed)
  [name], MD Phone: [phone]  Christian Hebert is a 72 y.o. male who presents today for follow-up  CC: Left lower quadrant pain  Left lower quadrant pain: Patient notes this actually feels a fair bit better.  His left low back pain is improved.  He has only had some discomfort if he has had to strain with a bowel movement though that is improved as well.  No abdominal pain.  No testicular pain.  No diarrhea, blood in his stool, nausea, vomiting, or fevers.  He has been taking the Augmentin.  He has 3 days left.  He was noted to have leukocytosis.  This has been mildly elevated for some time.  No night sweats.  A year ago he lost about 10 pounds without trying though he has not lost any further weight.  Social History   Tobacco Use  Smoking Status Former Smoker  . Packs/day: 1.00  . Years: 50.00  . Pack years: 50.00  . Types: Cigarettes  . Last attempt to quit: 01/07/2014  . Years since quitting: 3.6  Smokeless Tobacco Never Used     ROS see history of present illness  Objective  Physical Exam Vitals:   09/09/17 0944  BP: 140/80  Pulse: 60  Resp: 16  Temp: 97.9 F (36.6 C)  SpO2: 98%    BP Readings from Last 3 Encounters:  09/09/17 140/80  09/02/17 106/66  07/17/17 (!) 151/83   Wt Readings from Last 3 Encounters:  09/09/17 157 lb 6 oz (71.4 kg)  09/02/17 158 lb 6.4 oz (71.8 kg)  07/17/17 155 lb (70.3 kg)    Physical Exam  Constitutional: No distress.  Cardiovascular: Normal rate, regular rhythm and normal heart sounds.  Pulmonary/Chest: Effort normal and breath sounds normal.  Abdominal: Soft. Bowel sounds are normal.  Minimal tenderness over prior hernia repair sites though no abdominal tenderness or suprapubic tenderness, no palpable hernia noted  Musculoskeletal: He exhibits no edema.  Neurological: He is alert.  Skin: Skin is warm and dry. He is not diaphoretic.     Assessment/Plan: Please see individual problem list.  LLQ pain Symptoms  have improved.  Potentially could be related to diverticulitis that is now being adequately treated with Augmentin.  Discussed having the patient rechecked in 1 week after finishing antibiotics.  If abdominal pain recurs or persists he would need to have a CT scan abdomen and pelvis.  If his symptoms worsen he needs to be reevaluated.  We will see FNP next week for follow-up exam.  Leukocytosis Mildly elevated previously possibly in the setting of infection though has been mildly elevated in the past.  We will recheck today.  If persistently elevated he may need to see hematology.   Orders Placed This Encounter  Procedures  . CBC w/Diff    No orders of the defined types were placed in this encounter.    [name], MD [place]

## 2017-09-09 NOTE — Assessment & Plan Note (Signed)
Symptoms have improved.  Potentially could be related to diverticulitis that is now being adequately treated with Augmentin.  Discussed having the patient rechecked in 1 week after finishing antibiotics.  If abdominal pain recurs or persists he would need to have a CT scan abdomen and pelvis.  If his symptoms worsen he needs to be reevaluated.  We will see FNP next week for follow-up exam.

## 2017-09-10 ENCOUNTER — Encounter: Payer: Self-pay | Admitting: *Deleted

## 2017-09-16 ENCOUNTER — Ambulatory Visit (INDEPENDENT_AMBULATORY_CARE_PROVIDER_SITE_OTHER): Payer: Medicare Other | Admitting: Family Medicine

## 2017-09-16 ENCOUNTER — Encounter: Payer: Self-pay | Admitting: Family Medicine

## 2017-09-16 VITALS — BP 112/66 | HR 72 | Temp 98.2°F | Ht 71.0 in | Wt 159.0 lb

## 2017-09-16 DIAGNOSIS — D72829 Elevated white blood cell count, unspecified: Secondary | ICD-10-CM | POA: Diagnosis not present

## 2017-09-16 LAB — CBC WITH DIFFERENTIAL/PLATELET
BASOS PCT: 1.1 % (ref 0.0–3.0)
Basophils Absolute: 0.1 10*3/uL (ref 0.0–0.1)
EOS PCT: 0.7 % (ref 0.0–5.0)
Eosinophils Absolute: 0.1 10*3/uL (ref 0.0–0.7)
HCT: 46.2 % (ref 39.0–52.0)
Hemoglobin: 15.6 g/dL (ref 13.0–17.0)
Lymphocytes Relative: 12.2 % (ref 12.0–46.0)
Lymphs Abs: 1.4 10*3/uL (ref 0.7–4.0)
MCHC: 33.8 g/dL (ref 30.0–36.0)
MCV: 89.9 fl (ref 78.0–100.0)
MONOS PCT: 14 % — AB (ref 3.0–12.0)
Monocytes Absolute: 1.6 10*3/uL — ABNORMAL HIGH (ref 0.1–1.0)
Neutro Abs: 8.5 10*3/uL — ABNORMAL HIGH (ref 1.4–7.7)
Neutrophils Relative %: 72 % (ref 43.0–77.0)
Platelets: 310 10*3/uL (ref 150.0–400.0)
RBC: 5.14 Mil/uL (ref 4.22–5.81)
RDW: 13.7 % (ref 11.5–15.5)
WBC: 11.8 10*3/uL — ABNORMAL HIGH (ref 4.0–10.5)

## 2017-09-16 NOTE — Progress Notes (Signed)
Subjective:    Patient ID: AMERICUS PERKEY, male    DOB: Jan 11, 1945, 72 y.o.   MRN: [id number]  HPI  Patient presents to clinic for follow up on LLQ pain and diverticulitis that was treated with augmentin. He also had cbc done which showed elevated WBC count.  Patient states his ABD pain has improved, bowel movements are regular. No nausea or vomiting. No fever or chills.   States he does have some pain in left hip, but this is related to falling on tree stump after cutting down tree with chain saw and having to get out of way quickly when saw kicked back and tree fell the wrong way.   Patient Active Problem List   Diagnosis Date Noted  . Leukocytosis 09/09/2017  . Persistent testicular pain 09/02/2017  . LLQ pain 09/02/2017  . Penile lesion 09/02/2017  . BPH (benign prostatic hyperplasia) 09/02/2017  . Skin nodule 04/23/2017  . Diarrhea 04/23/2017  . Status post bilateral hernia repair   . Arthritis of knee 07/23/2016  . Prediabetes 07/16/2016  . Hyperlipidemia 07/16/2016  . COPD (chronic obstructive pulmonary disease) ([place]) 10/13/2015  . Aortic atherosclerosis ([place]) 10/13/2015  . Coronary artery calcification 10/13/2015  . Hearing loss 07/05/2015  . Former smoker 07/05/2015   Social History   Tobacco Use  . Smoking status: Former Smoker    Packs/day: 1.00    Years: 50.00    Pack years: 50.00    Types: Cigarettes    Last attempt to quit: 01/07/2014    Years since quitting: 3.6  . Smokeless tobacco: Never Used  Substance Use Topics  . Alcohol use: No    Alcohol/week: 0.0 standard drinks   Review of Systems   Constitutional: Negative for chills, fatigue and fever.  HENT: Negative for congestion, ear pain, sinus pain and sore throat.   Eyes: Negative.   Respiratory: Negative for cough, shortness of breath and wheezing.   Cardiovascular: Negative for chest pain, palpitations and leg swelling.  Gastrointestinal: Negative for abdominal pain, diarrhea, nausea and  vomiting.  Genitourinary: Negative for dysuria, frequency and urgency.  Musculoskeletal: left hip pain s/p fall  Skin: Negative for color change, pallor and rash.  Neurological: Negative for syncope, light-headedness and headaches.  Psychiatric/Behavioral: The patient is not nervous/anxious.       Objective:   Physical Exam  Constitutional: He is oriented to person, place, and time. He appears well-developed and well-nourished. No distress.  HENT:  Head: Normocephalic and atraumatic.  Eyes: No scleral icterus.  Cardiovascular: Normal rate and regular rhythm.  Pulmonary/Chest: Effort normal and breath sounds normal. No respiratory distress.  Abdominal: Soft. Bowel sounds are normal. He exhibits no distension. There is no tenderness. There is no guarding.  Musculoskeletal: Normal range of motion. He exhibits no edema or deformity.  ROM hips normal. No pain with adduction/abduction of leg at hip. Suspect he bruised self when he fell.   Neurological: He is alert and oriented to person, place, and time.  Skin: Skin is warm and dry. He is not diaphoretic. No pallor.  Psychiatric: He has a normal mood and affect. His behavior is normal.  Nursing note and vitals reviewed.   Vitals:   09/16/17 0947  BP: 112/66  Pulse: 72  Temp: 98.2 F (36.8 C)  SpO2: 92%      Assessment & Plan:    LLQ pain/diverticulitis -- Pain improved after finishing Augmentin course.   Leukocytosis -- New CBC with diff ordered today. If WBC continue to  be elevated even after improved pain and Augmentin course, will do hematology referral.   Left hip pain s/p fall -- Pain in hip in consistent with story of falling onto tree stump. Patient is walking well & ROM hip normal, I do not feel xray imaging is required.   Follow up in 3 months for general recheck, RTC sooner if any issues arise.

## 2017-09-17 NOTE — Addendum Note (Signed)
Addended by: Philis Nettle on: 09/17/2017 07:56 AM   Modules accepted: Orders

## 2017-09-25 ENCOUNTER — Encounter: Payer: Self-pay | Admitting: Urology

## 2017-09-25 ENCOUNTER — Other Ambulatory Visit: Payer: Self-pay

## 2017-09-25 ENCOUNTER — Ambulatory Visit (INDEPENDENT_AMBULATORY_CARE_PROVIDER_SITE_OTHER): Payer: Medicare Other | Admitting: Urology

## 2017-09-25 VITALS — BP 151/89 | HR 66 | Ht 72.0 in | Wt 158.0 lb

## 2017-09-25 DIAGNOSIS — N5082 Scrotal pain: Secondary | ICD-10-CM | POA: Diagnosis not present

## 2017-09-25 NOTE — Progress Notes (Signed)
09/25/2017 2:28 PM   Christian Hebert 12-May-1945 [id number]  Referring provider: Leone Haven, MD [address] [place], [place] [address]  CC: BPH/Penile lesion  HPI: I had the pleasure of seeing Mr. Christian Hebert in urology clinic today in consultation for BPH and possible penile lesion from Dr. Caryl Bis.  On a recent CT scan for abdominal pain he was noted to have a slightly enlarged prostate to 46 g with calcifications.  He reports he is minimally symptomatic from a urinary standpoint.  He reports he has a good stream.  He does have occasional nocturia 2-3 times per night.  Overall he is minimally bothered by his urinary symptoms.  Regarding his penile lesion, he had never noticed this until it was pointed out by his primary care physician.  He denies any risky sexual behavior.  He denies gross hematuria.  There are no aggravating or alleviating factors.  The duration is unknown.  Severity is mild.  He also had some left testicular pain over the summer, but he denies any current complaint.  Scrotal ultrasound was negative.  He does have COPD and is a lifelong smoker.  He has had a very mildly elevated leukocytosis in the 10-11k range and has follow-up with hematology next week.   PMH: Past Medical History:  Diagnosis Date  . COPD (chronic obstructive pulmonary disease) ([place])   . Coronary atherosclerosis    a. Treadmill Myoview 12/2015: Exercised for 8 minutes unable to achieve target heart rate, Lexiscan without significant ischemia, EF 55-65%  . GERD (gastroesophageal reflux disease)   . Inguinal hernia   . Personal history of tobacco use, presenting hazards to health 07/20/2015    Surgical History: Past Surgical History:  Procedure Laterality Date  . HERNIA REPAIR     bilateral inguinal   . INGUINAL HERNIA REPAIR Bilateral 09/12/2016   Procedure: LAPAROSCOPIC BILATERAL INGUINAL HERNIA REPAIR;  Surgeon: Florene Glen, MD;  Location: [facility] ORS;  Service:  General;  Laterality: Bilateral;  . TONSILLECTOMY     Allergies:  Allergies  Allergen Reactions  . Oxycodone Nausea And Vomiting    Family History: Family History  Problem Relation Age of Onset  . Stroke Mother   . Hypertension Mother   . Heart disease Father   . Hypertension Father     Social History:  reports that he quit smoking about 3 years ago. His smoking use included cigarettes. He has a 50.00 pack-year smoking history. He has never used smokeless tobacco. He reports that he does not drink alcohol or use drugs.  ROS: Please see flowsheet from today's date for complete review of systems.  Physical Exam: BP (!) 151/89   Pulse 66   Ht 6' (1.829 m)   Wt 158 lb (71.7 kg)   BMI 21.43 kg/m    Constitutional:  Alert and oriented, No acute distress. Cardiovascular: No clubbing, cyanosis, or edema. Respiratory: Normal respiratory effort, no increased work of breathing. GI: Abdomen is soft, nontender, nondistended, no abdominal masses GU: No CVA tenderness, uncircumcised phallus.  Glans with very subtle 2 to 3 mm brown discoloration on the left side.  This does not appear raised, irregular, or malignant. Lymph: No cervical or inguinal lymphadenopathy. Skin: No rashes, bruises or suspicious lesions. Neurologic: Grossly intact, no focal deficits, moving all 4 extremities. Psychiatric: Normal mood and affect.  Pertinent Imaging: I have personally reviewed the scrotal ultrasound dated 09/03/2017: Benign scrotal ultrasound, no testicular masses or lesions.  I also reviewed the CT from  07/17/2017, no nephrolithiasis or hydronephrosis, prostate mildly enlarged to 46g.  Assessment & Plan:   In summary, Mr. [name] is a 72 year old male here today to discuss multiple urologic concerns.  First, he has a mildly enlarged prostate on CT, however denies any significant urinary symptoms.  He has passed the age of recommended PSA screening per AUA guidelines.  This was discussed.  He denies  any testicular pain today, and his scrotal ultrasound in August was negative.  Finally, his primary care physician had concerns about a possible penile lesion on the glans.  On exam he has a very small area of discoloration on the left side of the glans which is benign appearing and does not require a biopsy.  I did discuss with the patient that if this became tender or enlarged to follow-up.  Follow up PRN  Billey Co, MD  [facility] [address], [place], [place] [address] [phone]

## 2017-09-26 ENCOUNTER — Other Ambulatory Visit: Payer: Self-pay

## 2017-09-26 ENCOUNTER — Inpatient Hospital Stay: Payer: Medicare Other | Attending: Oncology | Admitting: Oncology

## 2017-09-26 ENCOUNTER — Encounter: Payer: Self-pay | Admitting: Oncology

## 2017-09-26 ENCOUNTER — Inpatient Hospital Stay: Payer: Medicare Other

## 2017-09-26 VITALS — BP 136/85 | HR 61 | Temp 97.5°F | Resp 18 | Ht 72.0 in | Wt 158.2 lb

## 2017-09-26 DIAGNOSIS — D72820 Lymphocytosis (symptomatic): Secondary | ICD-10-CM | POA: Insufficient documentation

## 2017-09-26 DIAGNOSIS — Z720 Tobacco use: Secondary | ICD-10-CM

## 2017-09-26 DIAGNOSIS — F17210 Nicotine dependence, cigarettes, uncomplicated: Secondary | ICD-10-CM | POA: Diagnosis not present

## 2017-09-26 DIAGNOSIS — R634 Abnormal weight loss: Secondary | ICD-10-CM | POA: Diagnosis not present

## 2017-09-26 DIAGNOSIS — Z801 Family history of malignant neoplasm of trachea, bronchus and lung: Secondary | ICD-10-CM | POA: Insufficient documentation

## 2017-09-26 DIAGNOSIS — D72829 Elevated white blood cell count, unspecified: Secondary | ICD-10-CM | POA: Diagnosis not present

## 2017-09-26 DIAGNOSIS — D72821 Monocytosis (symptomatic): Secondary | ICD-10-CM | POA: Diagnosis not present

## 2017-09-26 LAB — CBC WITH DIFFERENTIAL/PLATELET
BASOS PCT: 1 %
Basophils Absolute: 0.1 10*3/uL (ref 0–0.1)
EOS ABS: 0.1 10*3/uL (ref 0–0.7)
EOS PCT: 1 %
HCT: 46.9 % (ref 40.0–52.0)
Hemoglobin: 16.2 g/dL (ref 13.0–18.0)
Lymphocytes Relative: 14 %
Lymphs Abs: 1.5 10*3/uL (ref 1.0–3.6)
MCH: 31.1 pg (ref 26.0–34.0)
MCHC: 34.5 g/dL (ref 32.0–36.0)
MCV: 90.1 fL (ref 80.0–100.0)
MONO ABS: 1.4 10*3/uL — AB (ref 0.2–1.0)
Monocytes Relative: 13 %
Neutro Abs: 7.6 10*3/uL — ABNORMAL HIGH (ref 1.4–6.5)
Neutrophils Relative %: 71 %
PLATELETS: 330 10*3/uL (ref 150–440)
RBC: 5.2 MIL/uL (ref 4.40–5.90)
RDW: 13.5 % (ref 11.5–14.5)
WBC: 10.8 10*3/uL — ABNORMAL HIGH (ref 3.8–10.6)

## 2017-09-26 LAB — COMPREHENSIVE METABOLIC PANEL
ALK PHOS: 76 U/L (ref 38–126)
ALT: 13 U/L (ref 0–44)
ANION GAP: 7 (ref 5–15)
AST: 17 U/L (ref 15–41)
Albumin: 4.1 g/dL (ref 3.5–5.0)
BUN: 15 mg/dL (ref 8–23)
CALCIUM: 9.4 mg/dL (ref 8.9–10.3)
CO2: 25 mmol/L (ref 22–32)
Chloride: 105 mmol/L (ref 98–111)
Creatinine, Ser: 1.28 mg/dL — ABNORMAL HIGH (ref 0.61–1.24)
GFR calc non Af Amer: 54 mL/min — ABNORMAL LOW (ref >60)
Glucose, Bld: 92 mg/dL (ref 70–99)
POTASSIUM: 4.3 mmol/L (ref 3.5–5.1)
Sodium: 137 mmol/L (ref 135–145)
TOTAL PROTEIN: 7.8 g/dL (ref 6.5–8.1)
Total Bilirubin: 0.9 mg/dL (ref 0.3–1.2)

## 2017-09-26 LAB — TSH: TSH: 2.146 u[IU]/mL (ref 0.350–4.500)

## 2017-09-26 LAB — TECHNOLOGIST SMEAR REVIEW: Tech Review: NORMAL

## 2017-09-26 NOTE — Progress Notes (Signed)
Hematology/Oncology Consult note [facility] Telephone:([phone] [phone]   Patient Care Team: Leone Haven, MD as PCP - General (Family Medicine)  REFERRING PROVIDER: Leone Haven, MD Philis Nettle CHIEF COMPLAINTS/REASON FOR VISIT:  Evaluation of leukocytosis  HISTORY OF PRESENTING ILLNESS:  Christian Hebert is a  72 y.o.  male with PMH listed below who was referred to me for evaluation of leukocytosis Reviewed patient' recent labs on 09/16/2017 obtained by PCP.  CBC showed elevated white count of 11.8, predominately monocytosis and neutrophilia. Previous lab records reviewed. Leukocytosis/monocytosis/neutrophilia onset of chronic, duration is since at least 2017  No aggravating or elevated factors Associated symptoms or signs: Reports unintentional weight loss since hernia surgery last year.  Not having a good appetite.  Does not eat much. No fever or chills, night sweating.  Smoking history: 50-pack-year of smoking, although stopped smoking in March 2018.  Patient admits smoking cigar occasionally.  History of recent oral steroid use or steroid injection: Denies History of recent infection: He had a history of diverticulitis.  CT 07/17/2017 was independently reviewed.  Showed mild distal descending and sigmoid diverticulitis without complicating features.  Reports having pain during the diverticulitis flare and he took a course of antibiotics, pain resolved.  Last dose of antibiotics was about 3 weeks ago.    Review of Systems  Constitutional: Positive for weight loss. Negative for chills, fever and malaise/fatigue.  HENT: Negative for nosebleeds and sore throat.   Eyes: Negative for double vision, photophobia and redness.  Respiratory: Negative for cough, shortness of breath and wheezing.   Cardiovascular: Negative for chest pain, palpitations and orthopnea.  Gastrointestinal: Negative for abdominal pain, blood in stool, nausea and  vomiting.  Genitourinary: Negative for dysuria.  Musculoskeletal: Negative for back pain, myalgias and neck pain.  Skin: Negative for itching and rash.  Neurological: Negative for dizziness, tingling and tremors.  Endo/Heme/Allergies: Negative for environmental allergies. Does not bruise/bleed easily.  Psychiatric/Behavioral: Negative for depression.    MEDICAL HISTORY:  Past Medical History:  Diagnosis Date  . COPD (chronic obstructive pulmonary disease) ([place])   . Coronary atherosclerosis    a. Treadmill Myoview 12/2015: Exercised for 8 minutes unable to achieve target heart rate, Lexiscan without significant ischemia, EF 55-65%  . Diverticulitis   . GERD (gastroesophageal reflux disease)   . Inguinal hernia   . Personal history of tobacco use, presenting hazards to health 07/20/2015    SURGICAL HISTORY: Past Surgical History:  Procedure Laterality Date  . HERNIA REPAIR     bilateral inguinal   . INGUINAL HERNIA REPAIR Bilateral 09/12/2016   Procedure: LAPAROSCOPIC BILATERAL INGUINAL HERNIA REPAIR;  Surgeon: Florene Glen, MD;  Location: [facility] ORS;  Service: General;  Laterality: Bilateral;  . TONSILLECTOMY      SOCIAL HISTORY: Social History   Socioeconomic History  . Marital status: Married    Spouse name: Not on file  . Number of children: Not on file  . Years of education: Not on file  . Highest education level: Not on file  Occupational History  . Not on file  Social Needs  . Financial resource strain: Not on file  . Food insecurity:    Worry: Not on file    Inability: Not on file  . Transportation needs:    Medical: Not on file    Non-medical: Not on file  Tobacco Use  . Smoking status: Former Smoker    Packs/day: 1.00    Years: 50.00  Pack years: 50.00    Types: Cigarettes    Last attempt to quit: 01/08/2016    Years since quitting: 1.7  . Smokeless tobacco: Never Used  Substance and Sexual Activity  . Alcohol use: No    Alcohol/week: 0.0 standard  drinks  . Drug use: No  . Sexual activity: Not Currently  Lifestyle  . Physical activity:    Days per week: Not on file    Minutes per session: Not on file  . Stress: Not on file  Relationships  . Social connections:    Talks on phone: Not on file    Gets together: Not on file    Attends religious service: Not on file    Active member of club or organization: Not on file    Attends meetings of clubs or organizations: Not on file    Relationship status: Not on file  . Intimate partner violence:    Fear of current or ex partner: Not on file    Emotionally abused: Not on file    Physically abused: Not on file    Forced sexual activity: Not on file  Other Topics Concern  . Not on file  Social History Narrative  . Not on file    FAMILY HISTORY: Family History  Problem Relation Age of Onset  . Stroke Mother   . Hypertension Mother   . Heart disease Father   . Hypertension Father   . Lung cancer Father   . Heart disease Sister     ALLERGIES:  is allergic to oxycodone.  MEDICATIONS:  No current outpatient medications on file.   No current facility-administered medications for this visit.      PHYSICAL EXAMINATION: ECOG PERFORMANCE STATUS: 0 - Asymptomatic Vitals:   09/26/17 0915  BP: 136/85  Pulse: 61  Resp: 18  Temp: (!) 97.5 F (36.4 C)  SpO2: 98%   Filed Weights   09/26/17 0915  Weight: 158 lb 3.2 oz (71.8 kg)    Physical Exam  Constitutional: He is oriented to person, place, and time. No distress.  HENT:  Head: Normocephalic and atraumatic.  Mouth/Throat: Oropharynx is clear and moist.  Eyes: Pupils are equal, round, and reactive to light. EOM are normal. No scleral icterus.  Neck: Normal range of motion. Neck supple.  Cardiovascular: Normal rate, regular rhythm and normal heart sounds.  Pulmonary/Chest: Effort normal. No respiratory distress. He has no wheezes.  Abdominal: Soft. Bowel sounds are normal. He exhibits no distension and no mass. There  is no tenderness.  Musculoskeletal: Normal range of motion. He exhibits no edema or deformity.  Neurological: He is alert and oriented to person, place, and time. No cranial nerve deficit. Coordination normal.  Skin: Skin is warm and dry. No rash noted. No erythema.  Psychiatric: He has a normal mood and affect. His behavior is normal. Thought content normal.    CMP Latest Ref Rng & Units 09/26/2017  Glucose 70 - 99 mg/dL 92  BUN 8 - 23 mg/dL 15  Creatinine 0.61 - 1.24 mg/dL 1.28(H)  Sodium 135 - 145 mmol/L 137  Potassium 3.5 - 5.1 mmol/L 4.3  Chloride 98 - 111 mmol/L 105  CO2 22 - 32 mmol/L 25  Calcium 8.9 - 10.3 mg/dL 9.4  Total Protein 6.5 - 8.1 g/dL 7.8  Total Bilirubin 0.3 - 1.2 mg/dL 0.9  Alkaline Phos 38 - 126 U/L 76  AST 15 - 41 U/L 17  ALT 0 - 44 U/L 13   CBC Latest Ref  Rng & Units 09/26/2017  WBC 3.8 - 10.6 K/uL 10.8(H)  Hemoglobin 13.0 - 18.0 g/dL 16.2  Hematocrit 40.0 - 52.0 % 46.9  Platelets 150 - 440 K/uL 330    No images are attached to the encounter.  US Scrotum W/doppler  Result Date: 09/03/2017 CLINICAL DATA:  72 year old male with left testicular pain and tenderness for the past 6 weeks EXAM: SCROTAL ULTRASOUND DOPPLER ULTRASOUND OF THE TESTICLES TECHNIQUE: Complete ultrasound examination of the testicles, epididymis, and other scrotal structures was performed. Color and spectral Doppler ultrasound were also utilized to evaluate blood flow to the testicles. COMPARISON:  None. FINDINGS: Right testicle Measurements: 4.4 x 2.5 x 1.5 cm. No mass or microlithiasis visualized. Left testicle Measurements: 3.9 x 2.1 x 3.4 cm. No mass or microlithiasis visualized. Right epididymis:  Normal in size and appearance. Left epididymis: Normal in size and appearance. Incidental note is made of a tiny 4 mm anechoic simple cyst in the epididymal head most consistent with an epididymal cyst. Hydrocele:  None visualized. Varicocele:  None visualized. Pulsed Doppler interrogation of both  testes demonstrates normal low resistance arterial and venous waveforms bilaterally. IMPRESSION: Essentially normal sonographic evaluation of the testicles. No evidence of testicular torsion, mass or orchitis. Incidentally noted tiny simple left epididymal cyst. Electronically Signed   By: Jacqulynn Cadet M.D.   On: 09/03/2017 16:02    LABORATORY DATA:  I have reviewed the data as listed Lab Results  Component Value Date   WBC 10.8 (H) 09/26/2017   HGB 16.2 09/26/2017   HCT 46.9 09/26/2017   MCV 90.1 09/26/2017   PLT 330 09/26/2017   Recent Labs    04/23/17 1343 07/17/17 1659 09/02/17 0923 09/26/17 0952  NA 136 136 136 137  K 4.1 4.0 4.5 4.3  CL 105 106 105 105  CO2 24 20* 25 25  GLUCOSE 89 117* 97 92  BUN 14 14 13 15   CREATININE 1.32 1.39* 1.29 1.28*  CALCIUM 9.1 8.8* 9.8 9.4  GFRNONAA  --  49*  --  54*  GFRAA  --  57*  --  >60  PROT 7.2  --  7.3 7.8  ALBUMIN 3.9  --  4.1 4.1  AST 14  --  14 17  ALT 12  --  12 13  ALKPHOS 67  --  73 76  BILITOT 0.4  --  0.4 0.9   Iron/TIBC/Ferritin/ %Sat No results found for: IRON, TIBC, FERRITIN, IRONPCTSAT      ASSESSMENT & PLAN:  1. Leukocytosis, unspecified type   2. Weight loss   3. Tobacco abuse    Labs reviewed and discussed with patient that Leukocytosis, predominantly neutrophilia/monocytosis, can be secondary to infection, chronic inflammation, [ ie his recent diverticulitis], smoking, autoimmune disease, or underlying malignant hematological disorders.   For the work up of patient's thrombocytosis, I recommend checking CBC;CMP, LDH, pathology smear review, flowcytometry, HIV, hepatitis, etc. Also discussed possible bone marrow biopsy if above workup is inconclusive; however I would prefer not to do a bone marrow unless absolutely needed.  # weight loss, check TSH as well as above labs.  #  Smoking cessation was discussed with patient and cessation program information was given to patient. High risk given his extensive  smoking history. Refer to lung cancer screening program. Patient initially not interested but after a discussion, he agrees.   Orders Placed This Encounter  Procedures  . CBC with Differential/Platelet    Standing Status:   Future    Number of  Occurrences:   1    Standing Expiration Date:   09/27/2018  . Comprehensive metabolic panel    Standing Status:   Future    Number of Occurrences:   1    Standing Expiration Date:   09/27/2018  . Flow cytometry panel-leukemia/lymphoma work-up    Standing Status:   Future    Number of Occurrences:   1    Standing Expiration Date:   09/27/2018  . Technologist smear review    Standing Status:   Future    Number of Occurrences:   1    Standing Expiration Date:   09/27/2018  . Hepatitis panel, acute    Standing Status:   Future    Number of Occurrences:   1    Standing Expiration Date:   09/27/2018  . HIV Antibody (routine testing w rflx)    Standing Status:   Future    Number of Occurrences:   1    Standing Expiration Date:   09/27/2018  . Epstein-Barr virus VCA antibody panel    Standing Status:   Future    Number of Occurrences:   1    Standing Expiration Date:   09/27/2018  . TSH    Standing Status:   Future    Number of Occurrences:   1    Standing Expiration Date:   09/27/2018    All questions were answered. The patient knows to call the clinic with any problems questions or concerns.  Return of visit: 1 week to discuss labs. Thank you for this kind referral and the opportunity to participate in the care of this patient. A copy of today's note is routed to referring provider  Total face to face encounter time for this patient visit was 45 min. >50% of the time was  spent in counseling and coordination of care.    Earlie Server, MD, PhD Hematology Oncology [facility] at [facility] Pager- [phone] 09/26/2017 =

## 2017-09-26 NOTE — Progress Notes (Signed)
Patient here for initial visit. °

## 2017-09-27 LAB — HEPATITIS PANEL, ACUTE
HEP A IGM: NEGATIVE
HEP B C IGM: NEGATIVE
Hepatitis B Surface Ag: NEGATIVE

## 2017-09-27 LAB — HIV ANTIBODY (ROUTINE TESTING W REFLEX): HIV SCREEN 4TH GENERATION: NONREACTIVE

## 2017-09-29 ENCOUNTER — Telehealth: Payer: Self-pay | Admitting: *Deleted

## 2017-09-29 LAB — EPSTEIN-BARR VIRUS VCA ANTIBODY PANEL
EBV EARLY ANTIGEN AB, IGG: 25 U/mL — AB (ref 0.0–8.9)
EBV VCA IgG: 132 U/mL — ABNORMAL HIGH (ref 0.0–17.9)

## 2017-09-29 NOTE — Telephone Encounter (Signed)
Received referral for low dose lung cancer screening CT scan.  Message left at phone number listed in EMR for patient to call either myself or Shawn Perkins back at [phone] to facilitate scheduling the scan.    

## 2017-10-01 ENCOUNTER — Telehealth: Payer: Self-pay | Admitting: *Deleted

## 2017-10-01 NOTE — Telephone Encounter (Signed)
Received referral for LDCT screening, patient reports that he will have to look at his calendar and call back at this point

## 2017-10-02 LAB — COMP PANEL: LEUKEMIA/LYMPHOMA

## 2017-10-03 ENCOUNTER — Inpatient Hospital Stay (HOSPITAL_BASED_OUTPATIENT_CLINIC_OR_DEPARTMENT_OTHER): Payer: Medicare Other | Admitting: Oncology

## 2017-10-03 ENCOUNTER — Encounter: Payer: Self-pay | Admitting: Oncology

## 2017-10-03 ENCOUNTER — Other Ambulatory Visit: Payer: Self-pay

## 2017-10-03 ENCOUNTER — Inpatient Hospital Stay: Payer: Medicare Other

## 2017-10-03 VITALS — BP 115/76 | HR 76 | Temp 98.0°F | Resp 18 | Wt 157.4 lb

## 2017-10-03 DIAGNOSIS — R634 Abnormal weight loss: Secondary | ICD-10-CM

## 2017-10-03 DIAGNOSIS — Z720 Tobacco use: Secondary | ICD-10-CM

## 2017-10-03 DIAGNOSIS — D72829 Elevated white blood cell count, unspecified: Secondary | ICD-10-CM

## 2017-10-03 DIAGNOSIS — F17210 Nicotine dependence, cigarettes, uncomplicated: Secondary | ICD-10-CM

## 2017-10-03 DIAGNOSIS — Z801 Family history of malignant neoplasm of trachea, bronchus and lung: Secondary | ICD-10-CM | POA: Diagnosis not present

## 2017-10-03 DIAGNOSIS — D72820 Lymphocytosis (symptomatic): Secondary | ICD-10-CM | POA: Diagnosis not present

## 2017-10-03 DIAGNOSIS — D72821 Monocytosis (symptomatic): Secondary | ICD-10-CM

## 2017-10-03 LAB — CBC WITH DIFFERENTIAL/PLATELET
BASOS ABS: 0.2 10*3/uL — AB (ref 0–0.1)
Basophils Relative: 1 %
Eosinophils Absolute: 0.1 10*3/uL (ref 0–0.7)
Eosinophils Relative: 1 %
HEMATOCRIT: 46 % (ref 40.0–52.0)
Hemoglobin: 15.5 g/dL (ref 13.0–18.0)
LYMPHS ABS: 2.1 10*3/uL (ref 1.0–3.6)
LYMPHS PCT: 17 %
MCH: 30.4 pg (ref 26.0–34.0)
MCHC: 33.8 g/dL (ref 32.0–36.0)
MCV: 90 fL (ref 80.0–100.0)
MONO ABS: 1.4 10*3/uL — AB (ref 0.2–1.0)
MONOS PCT: 11 %
Neutro Abs: 8.5 10*3/uL — ABNORMAL HIGH (ref 1.4–6.5)
Neutrophils Relative %: 70 %
PLATELETS: 336 10*3/uL (ref 150–440)
RBC: 5.11 MIL/uL (ref 4.40–5.90)
RDW: 14.1 % (ref 11.5–14.5)
WBC: 12.3 10*3/uL — ABNORMAL HIGH (ref 3.8–10.6)

## 2017-10-03 LAB — PATHOLOGIST SMEAR REVIEW

## 2017-10-03 LAB — LACTATE DEHYDROGENASE: LDH: 108 U/L (ref 98–192)

## 2017-10-03 NOTE — Progress Notes (Signed)
Hematology/Oncology follow up note [facility] Telephone:([phone]) [phone] Fax:(336) [phone]   Patient Care Team: Leone Haven, MD as PCP - General (Family Medicine)  REFERRING PROVIDER: Leone Haven, MD Philis Nettle CHIEF COMPLAINTS/REASON FOR VISIT:  Evaluation of leukocytosis  HISTORY OF PRESENTING ILLNESS:  Christian Hebert is a  72 y.o.  male with PMH listed below who was referred to me for evaluation of leukocytosis Reviewed patient' recent labs on 09/16/2017 obtained by PCP.  CBC showed elevated white count of 11.8, predominately monocytosis and neutrophilia. Previous lab records reviewed. Leukocytosis/monocytosis/neutrophilia onset of chronic, duration is since at least 2017  No aggravating or elevated factors Associated symptoms or signs: Reports unintentional weight loss since hernia surgery last year.  Not having a good appetite.  Does not eat much. No fever or chills, night sweating.  Smoking history: 50-pack-year of smoking, although stopped smoking in March 2018.  Patient admits smoking cigar occasionally.  History of recent oral steroid use or steroid injection: Denies History of recent infection: He had a history of diverticulitis.  CT 07/17/2017 was independently reviewed.  Showed mild distal descending and sigmoid diverticulitis without complicating features.  Reports having pain during the diverticulitis flare and he took a course of antibiotics, pain resolved.  Last dose of antibiotics was about 3 weeks ago.   INTERVAL HISTORY [name] is a 72 y.o. male who has above history reviewed by me today presents for follow up visit for management of leukocytosis.  During the interval, he has had lab work up done and presents to discuss results and further management plan.   Review of Systems  Constitutional: Positive for weight loss. Negative for chills, fever and malaise/fatigue.  HENT: Negative for nosebleeds and sore throat.     Eyes: Negative for double vision, photophobia and redness.  Respiratory: Negative for cough, shortness of breath and wheezing.   Cardiovascular: Negative for chest pain, palpitations and orthopnea.  Gastrointestinal: Negative for abdominal pain, blood in stool, nausea and vomiting.  Genitourinary: Negative for dysuria.  Musculoskeletal: Negative for back pain, myalgias and neck pain.  Skin: Negative for itching and rash.  Neurological: Negative for dizziness, tingling and tremors.  Endo/Heme/Allergies: Negative for environmental allergies. Does not bruise/bleed easily.  Psychiatric/Behavioral: Negative for depression.    MEDICAL HISTORY:  Past Medical History:  Diagnosis Date  . COPD (chronic obstructive pulmonary disease) ([place])   . Coronary atherosclerosis    a. Treadmill Myoview 12/2015: Exercised for 8 minutes unable to achieve target heart rate, Lexiscan without significant ischemia, EF 55-65%  . Diverticulitis   . GERD (gastroesophageal reflux disease)   . Inguinal hernia   . Personal history of tobacco use, presenting hazards to health 07/20/2015    SURGICAL HISTORY: Past Surgical History:  Procedure Laterality Date  . HERNIA REPAIR     bilateral inguinal   . INGUINAL HERNIA REPAIR Bilateral 09/12/2016   Procedure: LAPAROSCOPIC BILATERAL INGUINAL HERNIA REPAIR;  Surgeon: Florene Glen, MD;  Location: [facility] ORS;  Service: General;  Laterality: Bilateral;  . TONSILLECTOMY      SOCIAL HISTORY: Social History   Socioeconomic History  . Marital status: Married    Spouse name: Not on file  . Number of children: Not on file  . Years of education: Not on file  . Highest education level: Not on file  Occupational History  . Not on file  Social Needs  . Financial resource strain: Not on file  . Food insecurity:    Worry:  Not on file    Inability: Not on file  . Transportation needs:    Medical: Not on file    Non-medical: Not on file  Tobacco Use  . Smoking status:  Former Smoker    Packs/day: 1.00    Years: 50.00    Pack years: 50.00    Types: Cigarettes    Last attempt to quit: 01/08/2016    Years since quitting: 1.7  . Smokeless tobacco: Never Used  Substance and Sexual Activity  . Alcohol use: No    Alcohol/week: 0.0 standard drinks  . Drug use: No  . Sexual activity: Not Currently  Lifestyle  . Physical activity:    Days per week: Not on file    Minutes per session: Not on file  . Stress: Not on file  Relationships  . Social connections:    Talks on phone: Not on file    Gets together: Not on file    Attends religious service: Not on file    Active member of club or organization: Not on file    Attends meetings of clubs or organizations: Not on file    Relationship status: Not on file  . Intimate partner violence:    Fear of current or ex partner: Not on file    Emotionally abused: Not on file    Physically abused: Not on file    Forced sexual activity: Not on file  Other Topics Concern  . Not on file  Social History Narrative  . Not on file    FAMILY HISTORY: Family History  Problem Relation Age of Onset  . Stroke Mother   . Hypertension Mother   . Heart disease Father   . Hypertension Father   . Lung cancer Father   . Heart disease Sister     ALLERGIES:  is allergic to oxycodone.  MEDICATIONS:  No current outpatient medications on file.   No current facility-administered medications for this visit.      PHYSICAL EXAMINATION: ECOG PERFORMANCE STATUS: 0 - Asymptomatic Vitals:   10/03/17 1259  BP: 115/76  Pulse: 76  Resp: 18  Temp: 98 F (36.7 C)   Filed Weights   10/03/17 1259  Weight: 157 lb 6.4 oz (71.4 kg)    Physical Exam  Constitutional: He is oriented to person, place, and time. No distress.  HENT:  Head: Normocephalic and atraumatic.  Mouth/Throat: Oropharynx is clear and moist.  Eyes: Pupils are equal, round, and reactive to light. EOM are normal. No scleral icterus.  Neck: Normal range of  motion. Neck supple.  Cardiovascular: Normal rate, regular rhythm and normal heart sounds.  Pulmonary/Chest: Effort normal. No respiratory distress. He has no wheezes.  Abdominal: Soft. Bowel sounds are normal. He exhibits no distension and no mass. There is no tenderness.  Musculoskeletal: Normal range of motion. He exhibits no edema or deformity.  Neurological: He is alert and oriented to person, place, and time. No cranial nerve deficit. Coordination normal.  Skin: Skin is warm and dry. No rash noted. No erythema.  Psychiatric: He has a normal mood and affect. His behavior is normal. Thought content normal.    CMP Latest Ref Rng & Units 09/26/2017  Glucose 70 - 99 mg/dL 92  BUN 8 - 23 mg/dL 15  Creatinine 0.61 - 1.24 mg/dL 1.28(H)  Sodium 135 - 145 mmol/L 137  Potassium 3.5 - 5.1 mmol/L 4.3  Chloride 98 - 111 mmol/L 105  CO2 22 - 32 mmol/L 25  Calcium 8.9 -  10.3 mg/dL 9.4  Total Protein 6.5 - 8.1 g/dL 7.8  Total Bilirubin 0.3 - 1.2 mg/dL 0.9  Alkaline Phos 38 - 126 U/L 76  AST 15 - 41 U/L 17  ALT 0 - 44 U/L 13   CBC Latest Ref Rng & Units 10/03/2017  WBC 3.8 - 10.6 K/uL 12.3(H)  Hemoglobin 13.0 - 18.0 g/dL 15.5  Hematocrit 40.0 - 52.0 % 46.0  Platelets 150 - 440 K/uL 336    No images are attached to the encounter.  No results found.  LABORATORY DATA:  I have reviewed the data as listed Lab Results  Component Value Date   WBC 12.3 (H) 10/03/2017   HGB 15.5 10/03/2017   HCT 46.0 10/03/2017   MCV 90.0 10/03/2017   PLT 336 10/03/2017   Recent Labs    04/23/17 1343 07/17/17 1659 09/02/17 0923 09/26/17 0952  NA 136 136 136 137  K 4.1 4.0 4.5 4.3  CL 105 106 105 105  CO2 24 20* 25 25  GLUCOSE 89 117* 97 92  BUN 14 14 13 15   CREATININE 1.32 1.39* 1.29 1.28*  CALCIUM 9.1 8.8* 9.8 9.4  GFRNONAA  --  49*  --  54*  GFRAA  --  57*  --  >60  PROT 7.2  --  7.3 7.8  ALBUMIN 3.9  --  4.1 4.1  AST 14  --  14 17  ALT 12  --  12 13  ALKPHOS 67  --  73 76  BILITOT 0.4   --  0.4 0.9   Iron/TIBC/Ferritin/ %Sat No results found for: IRON, TIBC, FERRITIN, IRONPCTSAT      ASSESSMENT & PLAN:  1. Monoclonal B-cell lymphocytosis   2. Monocytosis   3. Weight loss   4. Tobacco abuse    Labs are reviewed and discussed with patient.  Flowcytometry showed monoclonal B cell lymphocytosis. Discussed with patient that this is a pre-CLL condition.   His blood smear showed persistent neutrophilia and monocytosis and basophil.   Will send pathology smear review, LDH, JAK 2 mutation and BCR-ABL.   # Weight loss, TSH normal. Weight has been stable for the past 10 days. Continue monitor. Awaiting further work up.  #  Smoking cessation was discussed with patient and cessation program information was given to patient. High risk given his extensive smoking history. Has referred patient to lung cancer screening program.    Orders Placed This Encounter  Procedures  . BCR-ABL1 FISH    Standing Status:   Future    Number of Occurrences:   1    Standing Expiration Date:   10/04/2018  . CBC with Differential/Platelet    Standing Status:   Future    Number of Occurrences:   1    Standing Expiration Date:   10/04/2018  . Pathologist smear review    Standing Status:   Future    Number of Occurrences:   1    Standing Expiration Date:   10/04/2018  . JAK2 V617F, w Reflex to CALR/E12/MPL    Standing Status:   Future    Number of Occurrences:   1    Standing Expiration Date:   10/04/2018  . Lactate dehydrogenase    Standing Status:   Future    Number of Occurrences:   1    Standing Expiration Date:   10/04/2018    All questions were answered. The patient knows to call the clinic with any problems questions or concerns.  Return of visit:  3 months.  Total face to face encounter time for this patient visit was 25 min. >50% of the time was  spent in counseling and coordination of care.   Earlie Server, MD, PhD Hematology Oncology [facility] Pager- [phone] 10/03/2017 =

## 2017-10-03 NOTE — Progress Notes (Signed)
Patient her for follow up

## 2017-10-06 ENCOUNTER — Telehealth: Payer: Self-pay | Admitting: *Deleted

## 2017-10-06 NOTE — Telephone Encounter (Signed)
Called patient in follow up for consideration in scheduling lung screening scan. Patient says he will speak with his wife about scheduling and call me back.

## 2017-10-07 ENCOUNTER — Telehealth: Payer: Self-pay | Admitting: *Deleted

## 2017-10-07 NOTE — Telephone Encounter (Signed)
ERROR

## 2017-10-09 ENCOUNTER — Telehealth: Payer: Self-pay | Admitting: *Deleted

## 2017-10-09 NOTE — Telephone Encounter (Signed)
Called patient about referral for ldct screening, patient was adamant about the fact that he would call us back if and when he decided to do this.  Patient will be put in the not screened area of the LDCT program and will be eligible when he calls back.

## 2017-10-13 LAB — BCR-ABL1 FISH
Cells Analyzed: 200
Cells Counted: 200

## 2017-10-14 LAB — JAK2 V617F, W REFLEX TO CALR/E12/MPL

## 2017-10-14 LAB — CALR + JAK2 E12-15 + MPL (REFLEXED)

## 2017-10-28 ENCOUNTER — Ambulatory Visit: Payer: Medicare Other | Admitting: Cardiovascular Disease

## 2017-12-02 ENCOUNTER — Ambulatory Visit (INDEPENDENT_AMBULATORY_CARE_PROVIDER_SITE_OTHER): Payer: Medicare Other | Admitting: Cardiovascular Disease

## 2017-12-02 ENCOUNTER — Encounter: Payer: Self-pay | Admitting: Cardiovascular Disease

## 2017-12-02 VITALS — BP 130/72 | HR 60 | Ht 71.0 in | Wt 153.2 lb

## 2017-12-02 DIAGNOSIS — Z720 Tobacco use: Secondary | ICD-10-CM | POA: Diagnosis not present

## 2017-12-02 DIAGNOSIS — I2510 Atherosclerotic heart disease of native coronary artery without angina pectoris: Secondary | ICD-10-CM | POA: Diagnosis not present

## 2017-12-02 DIAGNOSIS — R001 Bradycardia, unspecified: Secondary | ICD-10-CM | POA: Diagnosis not present

## 2017-12-02 DIAGNOSIS — E782 Mixed hyperlipidemia: Secondary | ICD-10-CM

## 2017-12-02 DIAGNOSIS — I2584 Coronary atherosclerosis due to calcified coronary lesion: Secondary | ICD-10-CM | POA: Diagnosis not present

## 2017-12-02 NOTE — Progress Notes (Signed)
Cardiology Office Note   Date:  12/02/2017   ID:  JONTAE ADEBAYO, DOB 01-20-1945, MRN [id number]  PCP:  Leone Haven, MD  Cardiologist:   Kathlyn Sacramento, MD   Chief Complaint  Patient presents with  . other    12 month follow up. Meds reviewed by the pt. verbally. "doing well."       History of Present Illness: Christian Hebert is a 72 y.o. male who is here today for a follow-up visit regarding coronary atherosclerosis and asymptomatic bradycardia. He has known history of COPD and tobacco use.  He is known to have aortic and coronary calcifications in the RCA and LAD distribution noted on previous CT scan of the lungs.  Abdominal aortic ultrasound showed no evidence of aortic aneurysm.   He has no hypertension or diabetes. He underwent a treadmill Myoview in July 2018 which was normal. The patient has known history of mild asymptomatic bradycardia. He has been doing well with no chest pain or shortness of breath.  The patient had diverticulitis in July.  He is feeling well now.  Past Medical History:  Diagnosis Date  . COPD (chronic obstructive pulmonary disease) ([place])   . Coronary atherosclerosis    a. Treadmill Myoview 12/2015: Exercised for 8 minutes unable to achieve target heart rate, Lexiscan without significant ischemia, EF 55-65%  . Diverticulitis   . GERD (gastroesophageal reflux disease)   . Inguinal hernia   . Personal history of tobacco use, presenting hazards to health 07/20/2015    Past Surgical History:  Procedure Laterality Date  . HERNIA REPAIR     bilateral inguinal   . INGUINAL HERNIA REPAIR Bilateral 09/12/2016   Procedure: LAPAROSCOPIC BILATERAL INGUINAL HERNIA REPAIR;  Surgeon: Florene Glen, MD;  Location: [facility] ORS;  Service: General;  Laterality: Bilateral;  . TONSILLECTOMY       Current Outpatient Medications  Medication Sig Dispense Refill  . Multiple Vitamins-Minerals (MULTIVITAMIN WITH MINERALS) tablet Take 1 tablet by mouth  daily.     No current facility-administered medications for this visit.     Allergies:   Oxycodone    Social History:  The patient  reports that he quit smoking about 22 months ago. His smoking use included cigarettes. He has a 50.00 pack-year smoking history. He has never used smokeless tobacco. He reports that he does not drink alcohol or use drugs.   Family History:  The patient's family history includes Heart disease in his father and sister; Hypertension in his father and mother; Lung cancer in his father; Stroke in his mother.    ROS:  Please see the history of present illness.   Otherwise, review of systems are positive for none.   All other systems are reviewed and negative.    PHYSICAL EXAM: VS:  BP 130/72 (BP Location: Left Arm, Patient Position: Sitting, Cuff Size: Normal)   Pulse 60   Ht 5\' 11"  (1.803 m)   Wt 153 lb 4 oz (69.5 kg)   BMI 21.37 kg/m  , BMI Body mass index is 21.37 kg/m. GEN: Well nourished, well developed, in no acute distress  HEENT: normal  Neck: no JVD, carotid bruits, or masses Cardiac: RRR; no murmurs, rubs, or gallops,no edema  Respiratory:  clear to auscultation bilaterally, normal work of breathing GI: soft, nontender, nondistended, + BS MS: no deformity or atrophy  Skin: warm and dry, no rash Neuro:  Strength and sensation are intact Psych: euthymic mood, full affect Distal pulses are normal.  EKG:  EKG is  ordered today. EKG showed normal sinus rhythm with no significant ST or T wave changes.   Recent Labs: 09/26/2017: ALT 13; BUN 15; Creatinine, Ser 1.28; Potassium 4.3; Sodium 137; TSH 2.146 10/03/2017: Hemoglobin 15.5; Platelets 336    Lipid Panel    Component Value Date/Time   CHOL 162 07/16/2016 1354   TRIG 173.0 (H) 07/16/2016 1354   HDL 26.50 (L) 07/16/2016 1354   CHOLHDL 6 07/16/2016 1354   VLDL 34.6 07/16/2016 1354   LDLCALC 101 (H) 07/16/2016 1354   LDLDIRECT 106.0 04/23/2017 1343      Wt Readings from Last 3  Encounters:  12/02/17 153 lb 4 oz (69.5 kg)  10/03/17 157 lb 6.4 oz (71.4 kg)  09/26/17 158 lb 3.2 oz (71.8 kg)       PAD Screen 12/12/2015  Previous PAD dx? No  Previous surgical procedure? No  Pain with walking? No  Feet/toe relief with dangling? No  Painful, non-healing ulcers? No  Extremities discolored? No      ASSESSMENT AND PLAN:  1.  Coronary atherosclerosis: This was noted on CT scan of the lungs.  Negative nuclear stress test was negative in 2018. We can consider coronary calcium score next year.  Continue low-dose aspirin.  2. Borderline hyperlipidemia: He has mixed hyperlipidemia with triglyceride of 173, HDL of 26 and an LDL of 101.  Borderline indication for treatment with a statin.    3.  Asymptomatic bradycardia: Continue to monitor clinically.  4.  Tobacco use: I discussed with him the importance of smoking cessation.  Disposition:   FU with me in 12 months.   Signed,  Kathlyn Sacramento, MD  12/02/2017 4:01 PM    [facility]

## 2017-12-02 NOTE — Patient Instructions (Signed)
Medication Instructions:  Your physician recommends that you continue on your current medications as directed. Please refer to the Current Medication list given to you today.  If you need a refill on your cardiac medications before your next appointment, please call your pharmacy.   Lab work: None ordered  If you have labs (blood work) drawn today and your tests are completely normal, you will receive your results only by: . MyChart Message (if you have MyChart) OR . A paper copy in the mail If you have any lab test that is abnormal or we need to change your treatment, we will call you to review the results.  Testing/Procedures: None ordered  Follow-Up: At [facility], you and your health needs are our priority.  As part of our continuing mission to provide you with exceptional heart care, we have created designated Provider Care Teams.  These Care Teams include your primary Cardiologist (physician) and Advanced Practice Providers (APPs -  Physician Assistants and Nurse Practitioners) who all work together to provide you with the care you need, when you need it. You will need a follow up appointment in 1 years.  Please call our office 2 months in advance to schedule this appointment.  You may see Dr. Arida or one of the following Advanced Practice Providers on your designated Care Team:   Christopher Berge, NP Ryan Dunn, PA-C . Jacquelyn Visser, PA-C  

## 2017-12-25 ENCOUNTER — Other Ambulatory Visit: Payer: Self-pay

## 2017-12-25 DIAGNOSIS — D72829 Elevated white blood cell count, unspecified: Secondary | ICD-10-CM

## 2017-12-26 ENCOUNTER — Other Ambulatory Visit: Payer: Self-pay

## 2017-12-26 ENCOUNTER — Inpatient Hospital Stay (HOSPITAL_BASED_OUTPATIENT_CLINIC_OR_DEPARTMENT_OTHER): Payer: Medicare Other | Admitting: Oncology

## 2017-12-26 ENCOUNTER — Inpatient Hospital Stay: Payer: Medicare Other | Attending: Oncology

## 2017-12-26 ENCOUNTER — Encounter: Payer: Self-pay | Admitting: Oncology

## 2017-12-26 VITALS — BP 153/76 | HR 72 | Temp 96.2°F | Resp 18 | Wt 155.1 lb

## 2017-12-26 DIAGNOSIS — R634 Abnormal weight loss: Secondary | ICD-10-CM

## 2017-12-26 DIAGNOSIS — D72820 Lymphocytosis (symptomatic): Secondary | ICD-10-CM | POA: Insufficient documentation

## 2017-12-26 DIAGNOSIS — Z720 Tobacco use: Secondary | ICD-10-CM

## 2017-12-26 DIAGNOSIS — D72829 Elevated white blood cell count, unspecified: Secondary | ICD-10-CM

## 2017-12-26 LAB — CBC WITH DIFFERENTIAL/PLATELET
Abs Immature Granulocytes: 0.04 10*3/uL (ref 0.00–0.07)
BASOS ABS: 0.1 10*3/uL (ref 0.0–0.1)
Basophils Relative: 1 %
EOS ABS: 0.1 10*3/uL (ref 0.0–0.5)
Eosinophils Relative: 1 %
HEMATOCRIT: 47 % (ref 39.0–52.0)
Hemoglobin: 15.5 g/dL (ref 13.0–17.0)
Immature Granulocytes: 0 %
LYMPHS ABS: 1.9 10*3/uL (ref 0.7–4.0)
Lymphocytes Relative: 17 %
MCH: 29.6 pg (ref 26.0–34.0)
MCHC: 33 g/dL (ref 30.0–36.0)
MCV: 89.7 fL (ref 80.0–100.0)
Monocytes Absolute: 1.2 10*3/uL — ABNORMAL HIGH (ref 0.1–1.0)
Monocytes Relative: 10 %
NRBC: 0 % (ref 0.0–0.2)
Neutro Abs: 8.4 10*3/uL — ABNORMAL HIGH (ref 1.7–7.7)
Neutrophils Relative %: 71 %
Platelets: 305 10*3/uL (ref 150–400)
RBC: 5.24 MIL/uL (ref 4.22–5.81)
RDW: 12.8 % (ref 11.5–15.5)
WBC: 11.7 10*3/uL — ABNORMAL HIGH (ref 4.0–10.5)

## 2017-12-26 NOTE — Progress Notes (Signed)
Patient here for follow up. No concerns voiced.  °

## 2017-12-27 NOTE — Progress Notes (Signed)
Hematology/Oncology follow up note [facility] Telephone:([phone] Fax:([phone]) [phone]   Patient Care Team: Leone Haven, MD as PCP - General (Family Medicine)  REFERRING PROVIDER: Leone Haven, MD Philis Nettle CHIEF COMPLAINTS/REASON FOR VISIT:  Evaluation of leukocytosis  HISTORY OF PRESENTING ILLNESS:  Christian Hebert is a  72 y.o.  male with PMH listed below who was referred to me for evaluation of leukocytosis Reviewed patient' recent labs on 09/16/2017 obtained by PCP.  CBC showed elevated white count of 11.8, predominately monocytosis and neutrophilia. Previous lab records reviewed. Leukocytosis/monocytosis/neutrophilia onset of chronic, duration is since at least 2017  No aggravating or elevated factors Associated symptoms or signs: Reports unintentional weight loss since hernia surgery last year.  Not having a good appetite.  Does not eat much. No fever or chills, night sweating.  Smoking history: 50-pack-year of smoking, although stopped smoking in March 2018.  Patient admits smoking cigar occasionally.  History of recent oral steroid use or steroid injection: Denies History of recent infection: He had a history of diverticulitis.  CT 07/17/2017 was independently reviewed.  Showed mild distal descending and sigmoid diverticulitis without complicating features.  Reports having pain during the diverticulitis flare and he took a course of antibiotics, pain resolved.  Last dose of antibiotics was about 3 weeks ago.   INTERVAL HISTORY Christian Hebert is a 72 y.o. male who has above history reviewed by me today presents for follow up visit for management of leukocytosis.  Reports feeling well. Lost 1-2 pounds for the past 3 months.  He was referred to lung cancer screening program and he declined.  Continues to smoke.  He tells me that he is not interested in any treatment even if cancer is diagnosed. Not interested in screening.  Appetite is  fair.   Review of Systems  Constitutional: Positive for weight loss. Negative for chills, fever and malaise/fatigue.  HENT: Negative for nosebleeds and sore throat.   Eyes: Negative for double vision, photophobia and redness.  Respiratory: Negative for cough, shortness of breath and wheezing.   Cardiovascular: Negative for chest pain, palpitations and orthopnea.  Gastrointestinal: Negative for abdominal pain, blood in stool, nausea and vomiting.  Genitourinary: Negative for dysuria.  Musculoskeletal: Negative for back pain, myalgias and neck pain.  Skin: Negative for itching and rash.  Neurological: Negative for dizziness, tingling and tremors.  Endo/Heme/Allergies: Negative for environmental allergies. Does not bruise/bleed easily.  Psychiatric/Behavioral: Negative for depression.    MEDICAL HISTORY:  Past Medical History:  Diagnosis Date  . COPD (chronic obstructive pulmonary disease) ([place])   . Coronary atherosclerosis    a. Treadmill Myoview 12/2015: Exercised for 8 minutes unable to achieve target heart rate, Lexiscan without significant ischemia, EF 55-65%  . Diverticulitis   . GERD (gastroesophageal reflux disease)   . Inguinal hernia   . Personal history of tobacco use, presenting hazards to health 07/20/2015    SURGICAL HISTORY: Past Surgical History:  Procedure Laterality Date  . HERNIA REPAIR     bilateral inguinal   . INGUINAL HERNIA REPAIR Bilateral 09/12/2016   Procedure: LAPAROSCOPIC BILATERAL INGUINAL HERNIA REPAIR;  Surgeon: Florene Glen, MD;  Location: [facility] ORS;  Service: General;  Laterality: Bilateral;  . TONSILLECTOMY      SOCIAL HISTORY: Social History   Socioeconomic History  . Marital status: Married    Spouse name: Not on file  . Number of children: Not on file  . Years of education: Not on file  . Highest  education level: Not on file  Occupational History  . Not on file  Social Needs  . Financial resource strain: Not on file  . Food  insecurity:    Worry: Not on file    Inability: Not on file  . Transportation needs:    Medical: Not on file    Non-medical: Not on file  Tobacco Use  . Smoking status: Former Smoker    Packs/day: 1.00    Years: 50.00    Pack years: 50.00    Types: Cigarettes    Last attempt to quit: 01/08/2016    Years since quitting: 1.9  . Smokeless tobacco: Never Used  Substance and Sexual Activity  . Alcohol use: No    Alcohol/week: 0.0 standard drinks  . Drug use: No  . Sexual activity: Not Currently  Lifestyle  . Physical activity:    Days per week: Not on file    Minutes per session: Not on file  . Stress: Not on file  Relationships  . Social connections:    Talks on phone: Not on file    Gets together: Not on file    Attends religious service: Not on file    Active member of club or organization: Not on file    Attends meetings of clubs or organizations: Not on file    Relationship status: Not on file  . Intimate partner violence:    Fear of current or ex partner: Not on file    Emotionally abused: Not on file    Physically abused: Not on file    Forced sexual activity: Not on file  Other Topics Concern  . Not on file  Social History Narrative  . Not on file    FAMILY HISTORY: Family History  Problem Relation Age of Onset  . Stroke Mother   . Hypertension Mother   . Heart disease Father   . Hypertension Father   . Lung cancer Father   . Heart disease Sister     ALLERGIES:  is allergic to oxycodone.  MEDICATIONS:  Current Outpatient Medications  Medication Sig Dispense Refill  . Multiple Vitamins-Minerals (MULTIVITAMIN WITH MINERALS) tablet Take 1 tablet by mouth daily.     No current facility-administered medications for this visit.      PHYSICAL EXAMINATION: ECOG PERFORMANCE STATUS: 0 - Asymptomatic Vitals:   12/26/17 1324 12/26/17 1326  BP: (!) 160/79 (!) 153/76  Pulse: 65 72  Resp: 18   Temp: (!) 96.2 F (35.7 C)    Filed Weights   12/26/17 1324    Weight: 155 lb 1.6 oz (70.4 kg)    Physical Exam Constitutional:      General: He is not in acute distress. HENT:     Head: Normocephalic and atraumatic.  Eyes:     General: No scleral icterus.    Pupils: Pupils are equal, round, and reactive to light.  Neck:     Musculoskeletal: Normal range of motion and neck supple.  Cardiovascular:     Rate and Rhythm: Normal rate and regular rhythm.     Heart sounds: Normal heart sounds.  Pulmonary:     Effort: Pulmonary effort is normal. No respiratory distress.     Breath sounds: No wheezing.  Abdominal:     General: Bowel sounds are normal. There is no distension.     Palpations: Abdomen is soft. There is no mass.     Tenderness: There is no abdominal tenderness.  Musculoskeletal: Normal range of motion.  General: No deformity.  Skin:    General: Skin is warm and dry.     Findings: No erythema or rash.  Neurological:     Mental Status: He is alert and oriented to person, place, and time.     Cranial Nerves: No cranial nerve deficit.     Coordination: Coordination normal.  Psychiatric:        Behavior: Behavior normal.        Thought Content: Thought content normal.     CMP Latest Ref Rng & Units 09/26/2017  Glucose 70 - 99 mg/dL 92  BUN 8 - 23 mg/dL 15  Creatinine 0.61 - 1.24 mg/dL 1.28(H)  Sodium 135 - 145 mmol/L 137  Potassium 3.5 - 5.1 mmol/L 4.3  Chloride 98 - 111 mmol/L 105  CO2 22 - 32 mmol/L 25  Calcium 8.9 - 10.3 mg/dL 9.4  Total Protein 6.5 - 8.1 g/dL 7.8  Total Bilirubin 0.3 - 1.2 mg/dL 0.9  Alkaline Phos 38 - 126 U/L 76  AST 15 - 41 U/L 17  ALT 0 - 44 U/L 13   CBC Latest Ref Rng & Units 12/26/2017  WBC 4.0 - 10.5 K/uL 11.7(H)  Hemoglobin 13.0 - 17.0 g/dL 15.5  Hematocrit 39.0 - 52.0 % 47.0  Platelets 150 - 400 K/uL 305    LABORATORY DATA:  I have reviewed the data as listed Lab Results  Component Value Date   WBC 11.7 (H) 12/26/2017   HGB 15.5 12/26/2017   HCT 47.0 12/26/2017   MCV 89.7  12/26/2017   PLT 305 12/26/2017   Recent Labs    04/23/17 1343 07/17/17 1659 09/02/17 0923 09/26/17 0952  NA 136 136 136 137  K 4.1 4.0 4.5 4.3  CL 105 106 105 105  CO2 24 20* 25 25  GLUCOSE 89 117* 97 92  BUN 14 14 13 15   CREATININE 1.32 1.39* 1.29 1.28*  CALCIUM 9.1 8.8* 9.8 9.4  GFRNONAA  --  49*  --  54*  GFRAA  --  57*  --  >60  PROT 7.2  --  7.3 7.8  ALBUMIN 3.9  --  4.1 4.1  AST 14  --  14 17  ALT 12  --  12 13  ALKPHOS 67  --  73 76  BILITOT 0.4  --  0.4 0.9   Iron/TIBC/Ferritin/ %Sat No results found for: IRON, TIBC, FERRITIN, IRONPCTSAT    07/17/2017 CT renal stone study.  1. Mild distal descending and sigmoid diverticulitis without complicating features. 2. Centrilobular emphysema at the lung bases. 3. Stable hepatic and right renal cysts. 4. Mild prostatomegaly. 5. Bilateral herniorrhaphies without recurrent hernia  ASSESSMENT & PLAN:  1. Monoclonal B-cell lymphocytosis   2. Tobacco abuse   3. Weight loss    monoclonal B cell lymphocytosis. Stable.  Unintentional weight loss, etiology unknown. TSH normal.  . 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   # Smoking cessation was discussed with patient  # Discussed about lung cancer screening program and he is not interested.   No orders of the defined types were placed in this encounter.   All questions were answered. The patient knows to call the clinic with any problems questions or concerns.  Return of visit: 3 months.  Total face to face encounter time for this patient visit was 25 min. >50% of the time was  spent in counseling and coordination of care.   Earlie Server, MD, PhD Hematology Oncology [facility] at  [facility] Pager- [phone] 12/27/2017 =

## 2017-12-29 ENCOUNTER — Telehealth: Payer: Self-pay | Admitting: *Deleted

## 2017-12-29 DIAGNOSIS — Z87891 Personal history of nicotine dependence: Secondary | ICD-10-CM

## 2017-12-29 DIAGNOSIS — Z122 Encounter for screening for malignant neoplasm of respiratory organs: Secondary | ICD-10-CM

## 2017-12-29 NOTE — Telephone Encounter (Signed)
Patient has been notified that annual lung cancer screening low dose CT scan is due currently or will be in near future. Confirmed that patient is within the age range of 55-77, and asymptomatic, (no signs or symptoms of lung cancer). Patient denies illness that would prevent curative treatment for lung cancer if found. Verified smoking history, (current, 52 pack year). The shared decision making visit was done 07/21/15. Patient is agreeable for CT scan being scheduled.

## 2018-01-02 ENCOUNTER — Ambulatory Visit
Admission: RE | Admit: 2018-01-02 | Discharge: 2018-01-02 | Disposition: A | Discharge status: Home / Self Care | Payer: Medicare Other | Source: Ambulatory Visit | Attending: Oncology | Admitting: Oncology

## 2018-01-02 DIAGNOSIS — Z87891 Personal history of nicotine dependence: Secondary | ICD-10-CM | POA: Diagnosis not present

## 2018-01-02 DIAGNOSIS — F17210 Nicotine dependence, cigarettes, uncomplicated: Secondary | ICD-10-CM | POA: Diagnosis not present

## 2018-01-02 DIAGNOSIS — Z122 Encounter for screening for malignant neoplasm of respiratory organs: Secondary | ICD-10-CM | POA: Diagnosis not present

## 2018-01-06 ENCOUNTER — Encounter: Payer: Self-pay | Admitting: *Deleted

## 2018-01-27 ENCOUNTER — Ambulatory Visit (INDEPENDENT_AMBULATORY_CARE_PROVIDER_SITE_OTHER): Payer: Medicare Other

## 2018-01-27 VITALS — BP 130/70 | HR 55 | Temp 97.6°F | Resp 17 | Ht 71.0 in | Wt 160.1 lb

## 2018-01-27 DIAGNOSIS — Z0000 Encounter for general adult medical examination without abnormal findings: Secondary | ICD-10-CM

## 2018-01-27 NOTE — Progress Notes (Signed)
Subjective:   Christian Hebert is a 73 y.o. male who presents for Medicare Annual/Subsequent preventive examination.  Review of Systems:  No ROS.  Medicare Wellness Visit. Additional risk factors are reflected in the social history. Cardiac Risk Factors include: advanced age (>60men, >7 women);male gender     Objective:    Vitals: BP 130/70 (BP Location: Left Arm, Patient Position: Sitting, Cuff Size: Normal)   Pulse (!) 55   Temp 97.6 F (36.4 C) (Oral)   Resp 17   Ht 5\' 11"  (1.803 m)   Wt 160 lb 1.9 oz (72.6 kg)   SpO2 94%   BMI 22.33 kg/m   Body mass index is 22.33 kg/m.  Advanced Directives 01/27/2018 12/26/2017 10/03/2017 09/26/2017 09/02/2016 07/20/2016 07/15/2016  Does Patient Have a Medical Advance Directive? Yes Yes Yes Yes Yes Yes Yes  Type of Paramedic of [place];Living will - - Living will;Healthcare Power of [place];Living will [place];Living will Living will;Healthcare Power of Attorney  Does patient want to make changes to medical advance directive? No - Patient declined - - - - - No - Patient declined  Copy of [place] in Chart? No - copy requested - - - No - copy requested No - copy requested No - copy requested    Tobacco Social History   Tobacco Use  Smoking Status Former Smoker  . Packs/day: 1.00  . Years: 50.00  . Pack years: 50.00  . Types: Cigarettes  . Last attempt to quit: 01/08/2016  . Years since quitting: 2.0  Smokeless Tobacco Never Used     Counseling given: Not Answered   Clinical Intake:  Pre-visit preparation completed: Yes        Diabetes: No  How often do you need to have someone help you when you read instructions, pamphlets, or other written materials from your doctor or pharmacy?: 1 - Never  Interpreter Needed?: No     Past Medical History:  Diagnosis Date  . COPD (chronic obstructive pulmonary disease) ([place])   . Coronary  atherosclerosis    a. Treadmill Myoview 12/2015: Exercised for 8 minutes unable to achieve target heart rate, Lexiscan without significant ischemia, EF 55-65%  . Diverticulitis   . GERD (gastroesophageal reflux disease)   . Inguinal hernia   . Personal history of tobacco use, presenting hazards to health 07/20/2015   Past Surgical History:  Procedure Laterality Date  . HERNIA REPAIR     bilateral inguinal   . INGUINAL HERNIA REPAIR Bilateral 09/12/2016   Procedure: LAPAROSCOPIC BILATERAL INGUINAL HERNIA REPAIR;  Surgeon: Florene Glen, MD;  Location: [facility] ORS;  Service: General;  Laterality: Bilateral;  . TONSILLECTOMY     Family History  Problem Relation Age of Onset  . Stroke Mother   . Hypertension Mother   . Heart disease Father   . Hypertension Father   . Lung cancer Father   . Heart disease Sister    Social History   Socioeconomic History  . Marital status: Married    Spouse name: Not on file  . Number of children: Not on file  . Years of education: Not on file  . Highest education level: Not on file  Occupational History  . Not on file  Social Needs  . Financial resource strain: Not on file  . Food insecurity:    Worry: Never true    Inability: Never true  . Transportation needs:    Medical: No  Non-medical: No  Tobacco Use  . Smoking status: Former Smoker    Packs/day: 1.00    Years: 50.00    Pack years: 50.00    Types: Cigarettes    Last attempt to quit: 01/08/2016    Years since quitting: 2.0  . Smokeless tobacco: Never Used  Substance and Sexual Activity  . Alcohol use: No    Alcohol/week: 0.0 standard drinks  . Drug use: No  . Sexual activity: Not Currently  Lifestyle  . Physical activity:    Days per week: 3 days    Minutes per session: 30 min  . Stress: Not at all  Relationships  . Social connections:    Talks on phone: Not on file    Gets together: Not on file    Attends religious service: Not on file    Active member of club or  organization: Not on file    Attends meetings of clubs or organizations: Not on file    Relationship status: Not on file  Other Topics Concern  . Not on file  Social History Narrative  . Not on file    Outpatient Encounter Medications as of 01/27/2018  Medication Sig  . Multiple Vitamins-Minerals (MULTIVITAMIN WITH MINERALS) tablet Take 1 tablet by mouth daily.   No facility-administered encounter medications on file as of 01/27/2018.     Activities of Daily Living In your present state of health, do you have any difficulty performing the following activities: 01/27/2018  Hearing? Y  Comment Hearing aid  Vision? N  Difficulty concentrating or making decisions? N  Walking or climbing stairs? N  Dressing or bathing? N  Doing errands, shopping? N  Preparing Food and eating ? N  Using the Toilet? N  In the past six months, have you accidently leaked urine? N  Do you have problems with loss of bowel control? N  Managing your Medications? N  Managing your Finances? N  Housekeeping or managing your Housekeeping? N  Some recent data might be hidden    Patient Care Team: Leone Haven, MD as PCP - General (Family Medicine)   Assessment:   This is a routine wellness examination for Christian Hebert.  Health Screenings  PSA -09/02/17 (1.08) Glaucoma -none reported Hearing  -hearing aids Hemoglobin A1C -04/23/17 (5.9) Direct LDL -04/23/17 (106.0) TSH- 09/26/17 (2..146) Dental-dentures  Social  Alcohol intake -no Smoking history- none Smokers in home? none Illicit drug use? none Exercise -walking, golf Diet -regular Sexually Active -not currently  Safety  Patient feels safe at home.  Patient does have smoke detectors at home  Patient does wear sunscreen or protective clothing when in direct sunlight  Patient does wear seat belt when driving or riding with others.   Activities of Daily Living Patient can do their own household chores. Denies needing assistance with: driving,  feeding themselves, getting from bed to chair, getting to the toilet, bathing/showering, dressing, managing money, climbing flight of stairs, or preparing meals.   Depression Screen Patient denies losing interest in daily life, feeling hopeless, or crying easily over simple problems.   Fall Screen Patient denies being afraid of falling or falling in the last year.   Memory Screen Patient denies problems with memory, misplacing items, and is able to balance checkbook/bank accounts.  Patient is alert, normal appearance, oriented to person/place/and time. Correctly identified the president of the [place], recall of 2/3 objects, and performing simple calculations.  Patient displays appropriate judgement and can read correct time from watch face.  Immunizations The following Immunizations were discussed: Influenza, shingles, pneumonia, and TDAP.  Other Providers Patient Care Team: Leone Haven, MD as PCP - General (Family Medicine)  Exercise Activities and Dietary recommendations Current Exercise Habits: Home exercise routine, Type of exercise: walking(golf), Time (Minutes): 30, Frequency (Times/Week): 3, Weekly Exercise (Minutes/Week): 90, Intensity: Mild  Goals      Patient Stated   . Maintain Healthy Lifestyle (pt-stated)     Stay active Follow up with hearing aid replacements through the [facility] Monitor sugar intake       Fall Risk Fall Risk  01/27/2018 09/02/2017 07/30/2016 07/16/2016 07/15/2016  Falls in the past year? 0 No No No No  Comment - - Emmi Telephone Survey: data to providers prior to load - -   Depression Screen PHQ 2/9 Scores 01/27/2018 07/16/2016 07/15/2016  PHQ - 2 Score 0 0 0  PHQ- 9 Score - 0 -  Exception Documentation - Patient refusal -    Cognitive Function MMSE - Mini Mental State Exam 07/15/2016  Orientation to time 5  Orientation to Place 5  Registration 3  Attention/ Calculation 5  Recall 3  Language- name 2 objects 2  Language- repeat 1  Language-  follow 3 step command 3  Language- read & follow direction 1  Write a sentence 1  Copy design 1  Total score 30     6CIT Screen 01/27/2018  What Year? 0 points  What month? 0 points  What time? 0 points  Count back from 20 0 points  Months in reverse 2 points  Repeat phrase 0 points  Total Score 2     There is no immunization history on file for this patient.  Screening Tests Health Maintenance  Topic Date Due  . TETANUS/TDAP  [birth year]01/1965  . Fecal DNA (Cologuard)  08/07/2019  . Hepatitis C Screening  Completed  . INFLUENZA VACCINE  Discontinued  . PNA vac Low Risk Adult  Discontinued       Plan:    End of life planning; Advance aging; Advanced directives discussed. Copy of current HCPOA/Living Will requested.    I have personally reviewed and noted the following in the patient's chart:   . Medical and social history . Use of alcohol, tobacco or illicit drugs  . Current medications and supplements . Functional ability and status . Nutritional status . Physical activity . Advanced directives . List of other physicians . Hospitalizations, surgeries, and ER visits in previous 12 months . Vitals . Screenings to include cognitive, depression, and falls . Referrals and appointments  In addition, I have reviewed and discussed with patient certain preventive protocols, quality metrics, and best practice recommendations. A written personalized care plan for preventive services as well as general preventive health recommendations were provided to patient.     Varney Biles, LPN  4/40/1027   Reviewed above information.  Agree with assessment and plan.    Dr Nicki Reaper

## 2018-01-27 NOTE — Patient Instructions (Addendum)
  Mr. Christian Hebert , Thank you for taking time to come for your Medicare Wellness Visit. I appreciate your ongoing commitment to your health goals. Please review the following plan we discussed and let me know if I can assist you in the future.   Schedule eye exam  These are the goals we discussed: Goals      Patient Stated   . Maintain Healthy Lifestyle (pt-stated)     Stay active [facility] Monitor sugar intake       This is a list of the screening recommended for you and due dates:  Health Maintenance  Topic Date Due  . Tetanus Vaccine  01/01/1965  . Cologuard (Stool DNA test)  08/07/2019  .  Hepatitis C: One time screening is recommended by Center for Disease Control  (CDC) for  adults born from [birth year] through 1966.   Completed  . Flu Shot  Discontinued  . Pneumonia vaccines  Discontinued

## 2018-06-24 ENCOUNTER — Inpatient Hospital Stay: Payer: Medicare Other | Attending: Oncology

## 2018-06-24 ENCOUNTER — Other Ambulatory Visit: Payer: Self-pay

## 2018-06-24 DIAGNOSIS — D72829 Elevated white blood cell count, unspecified: Secondary | ICD-10-CM | POA: Diagnosis not present

## 2018-06-24 DIAGNOSIS — D72820 Lymphocytosis (symptomatic): Secondary | ICD-10-CM

## 2018-06-24 DIAGNOSIS — Z720 Tobacco use: Secondary | ICD-10-CM

## 2018-06-24 DIAGNOSIS — R634 Abnormal weight loss: Secondary | ICD-10-CM

## 2018-06-24 LAB — COMPREHENSIVE METABOLIC PANEL
ALT: 15 U/L (ref 0–44)
AST: 16 U/L (ref 15–41)
Albumin: 4.1 g/dL (ref 3.5–5.0)
Alkaline Phosphatase: 79 U/L (ref 38–126)
Anion gap: 6 (ref 5–15)
BUN: 14 mg/dL (ref 8–23)
CO2: 27 mmol/L (ref 22–32)
Calcium: 9.1 mg/dL (ref 8.9–10.3)
Chloride: 105 mmol/L (ref 98–111)
Creatinine, Ser: 1.48 mg/dL — ABNORMAL HIGH (ref 0.61–1.24)
GFR calc Af Amer: 54 mL/min — ABNORMAL LOW (ref >60)
GFR calc non Af Amer: 46 mL/min — ABNORMAL LOW (ref >60)
Glucose, Bld: 98 mg/dL (ref 70–99)
Potassium: 4.6 mmol/L (ref 3.5–5.1)
Sodium: 138 mmol/L (ref 135–145)
Total Bilirubin: 0.7 mg/dL (ref 0.3–1.2)
Total Protein: 7.9 g/dL (ref 6.5–8.1)

## 2018-06-24 LAB — CBC WITH DIFFERENTIAL/PLATELET
Abs Immature Granulocytes: 0.05 10*3/uL (ref 0.00–0.07)
Basophils Absolute: 0.1 10*3/uL (ref 0.0–0.1)
Basophils Relative: 1 %
Eosinophils Absolute: 0.1 10*3/uL (ref 0.0–0.5)
Eosinophils Relative: 1 %
HCT: 48.2 % (ref 39.0–52.0)
Hemoglobin: 16 g/dL (ref 13.0–17.0)
Immature Granulocytes: 0 %
Lymphocytes Relative: 13 %
Lymphs Abs: 1.5 10*3/uL (ref 0.7–4.0)
MCH: 30 pg (ref 26.0–34.0)
MCHC: 33.2 g/dL (ref 30.0–36.0)
MCV: 90.4 fL (ref 80.0–100.0)
Monocytes Absolute: 1.4 10*3/uL — ABNORMAL HIGH (ref 0.1–1.0)
Monocytes Relative: 12 %
Neutro Abs: 8.3 10*3/uL — ABNORMAL HIGH (ref 1.7–7.7)
Neutrophils Relative %: 73 %
Platelets: 322 10*3/uL (ref 150–400)
RBC: 5.33 MIL/uL (ref 4.22–5.81)
RDW: 13.1 % (ref 11.5–15.5)
WBC: 11.5 10*3/uL — ABNORMAL HIGH (ref 4.0–10.5)
nRBC: 0 % (ref 0.0–0.2)

## 2018-06-25 ENCOUNTER — Encounter: Payer: Self-pay | Admitting: Oncology

## 2018-06-25 ENCOUNTER — Inpatient Hospital Stay (HOSPITAL_BASED_OUTPATIENT_CLINIC_OR_DEPARTMENT_OTHER): Payer: Medicare Other | Admitting: Oncology

## 2018-06-25 DIAGNOSIS — D72829 Elevated white blood cell count, unspecified: Secondary | ICD-10-CM

## 2018-06-25 DIAGNOSIS — N183 Chronic kidney disease, stage 3 unspecified: Secondary | ICD-10-CM

## 2018-06-25 DIAGNOSIS — D72820 Lymphocytosis (symptomatic): Secondary | ICD-10-CM

## 2018-06-25 NOTE — Progress Notes (Signed)
HEMATOLOGY-ONCOLOGY TeleHEALTH VISIT PROGRESS NOTE  I connected with Christian Hebert on 06/25/18 at  9:15 AM EDT by video enabled telemedicine visit and verified that I am speaking with the correct person using two identifiers. I discussed the limitations, risks, security and privacy concerns of performing an evaluation and management service by telemedicine and the availability of in-person appointments. I also discussed with the patient that there may be a patient responsible charge related to this service. The patient expressed understanding and agreed to proceed.   Other persons participating in the visit and their role in the encounter:  Geraldine Solar, [place], check in patient   Janeann Merl, RN, check in patient.   Patient's location: Home  Provider's location: home office Chief Complaint: Follow-up for leukocytosis   INTERVAL HISTORY Christian Hebert is a 73 y.o. male who has above history reviewed by me today presents for follow up visit for management of leukocytosis  problems and complaints are listed below:  Patient reports feeling well at baseline.  Today he has no new complaints.    No fever or chills.  No night sweating.  Review of Systems  Constitutional: Positive for fatigue. Negative for appetite change, chills, fever and unexpected weight change.  HENT:   Negative for hearing loss and voice change.   Eyes: Negative for eye problems and icterus.  Respiratory: Negative for chest tightness, cough and shortness of breath.   Cardiovascular: Negative for chest pain and leg swelling.  Gastrointestinal: Negative for abdominal distention and abdominal pain.  Endocrine: Negative for hot flashes.  Genitourinary: Negative for difficulty urinating, dysuria and frequency.   Musculoskeletal: Negative for arthralgias.  Skin: Negative for itching and rash.  Neurological: Negative for light-headedness and numbness.  Hematological: Negative for adenopathy. Does not bruise/bleed easily.   Psychiatric/Behavioral: Negative for confusion.    Past Medical History:  Diagnosis Date  . COPD (chronic obstructive pulmonary disease) ([place])   . Coronary atherosclerosis    a. Treadmill Myoview 12/2015: Exercised for 8 minutes unable to achieve target heart rate, Lexiscan without significant ischemia, EF 55-65%  . Diverticulitis   . GERD (gastroesophageal reflux disease)   . Inguinal hernia   . Personal history of tobacco use, presenting hazards to health 07/20/2015   Past Surgical History:  Procedure Laterality Date  . HERNIA REPAIR     bilateral inguinal   . INGUINAL HERNIA REPAIR Bilateral 09/12/2016   Procedure: LAPAROSCOPIC BILATERAL INGUINAL HERNIA REPAIR;  Surgeon: Florene Glen, MD;  Location: [facility] ORS;  Service: General;  Laterality: Bilateral;  . TONSILLECTOMY      Family History  Problem Relation Age of Onset  . Stroke Mother   . Hypertension Mother   . Heart disease Father   . Hypertension Father   . Lung cancer Father   . Heart disease Sister     Social History   Socioeconomic History  . Marital status: Married    Spouse name: Not on file  . Number of children: Not on file  . Years of education: Not on file  . Highest education level: Not on file  Occupational History  . Not on file  Social Needs  . Financial resource strain: Not on file  . Food insecurity    Worry: Never true    Inability: Never true  . Transportation needs    Medical: No    Non-medical: No  Tobacco Use  . Smoking status: Former Smoker    Packs/day: 1.00    Years: 50.00  Pack years: 50.00    Types: Cigarettes    Quit date: 01/08/2016    Years since quitting: 2.4  . Smokeless tobacco: Never Used  Substance and Sexual Activity  . Alcohol use: No    Alcohol/week: 0.0 standard drinks  . Drug use: No  . Sexual activity: Not Currently  Lifestyle  . Physical activity    Days per week: 3 days    Minutes per session: 30 min  . Stress: Not at all  Relationships  . Social  Herbalist on phone: Not on file    Gets together: Not on file    Attends religious service: Not on file    Active member of club or organization: Not on file    Attends meetings of clubs or organizations: Not on file    Relationship status: Not on file  . Intimate partner violence    Fear of current or ex partner: No    Emotionally abused: No    Physically abused: No    Forced sexual activity: No  Other Topics Concern  . Not on file  Social History Narrative  . Not on file    Current Outpatient Medications on File Prior to Visit  Medication Sig Dispense Refill  . Multiple Vitamins-Minerals (MULTIVITAMIN WITH MINERALS) tablet Take 1 tablet by mouth daily.     No current facility-administered medications on file prior to visit.     Allergies  Allergen Reactions  . Oxycodone Nausea And Vomiting       Observations/Objective: There were no vitals filed for this visit. There is no height or weight on file to calculate BMI.  Physical Exam  Constitutional: He is oriented to person, place, and time. No distress.  HENT:  Head: Normocephalic and atraumatic.  Pulmonary/Chest: Effort normal and breath sounds normal.  Neurological: He is alert and oriented to person, place, and time.  Psychiatric: Affect normal.    CBC    Component Value Date/Time   WBC 11.5 (H) 06/24/2018 1104   RBC 5.33 06/24/2018 1104   HGB 16.0 06/24/2018 1104   HCT 48.2 06/24/2018 1104   PLT 322 06/24/2018 1104   MCV 90.4 06/24/2018 1104   MCH 30.0 06/24/2018 1104   MCHC 33.2 06/24/2018 1104   RDW 13.1 06/24/2018 1104   LYMPHSABS 1.5 06/24/2018 1104   MONOABS 1.4 (H) 06/24/2018 1104   EOSABS 0.1 06/24/2018 1104   BASOSABS 0.1 06/24/2018 1104    CMP     Component Value Date/Time   NA 138 06/24/2018 1104   K 4.6 06/24/2018 1104   CL 105 06/24/2018 1104   CO2 27 06/24/2018 1104   GLUCOSE 98 06/24/2018 1104   BUN 14 06/24/2018 1104   CREATININE 1.48 (H) 06/24/2018 1104   CALCIUM 9.1  06/24/2018 1104   PROT 7.9 06/24/2018 1104   ALBUMIN 4.1 06/24/2018 1104   AST 16 06/24/2018 1104   ALT 15 06/24/2018 1104   ALKPHOS 79 06/24/2018 1104   BILITOT 0.7 06/24/2018 1104   GFRNONAA 46 (L) 06/24/2018 1104   GFRAA 54 (L) 06/24/2018 1104     Assessment and Plan: 1. CKD (chronic kidney disease) stage 3, GFR 30-59 ml/min (HCC)   2. Leukocytosis, unspecified type   3. Monoclonal B-cell lymphocytosis of undetermined significance      #leukocytosis, wbc stable. Previously work up showed monoclonal lymphocytosis. Lymphocyte count is stable.   he has persistent neutrophilia/monocytosis.   work up so far negative. Can not rule out CMML, he  is asymptomatic, continue to monitor.   # CKD, creatinine is worsening. Will obtain multiple myeloma panel.  Advise patient to follow up with PCP for further management.   Follow Up Instructions:  6 months.    I discussed the assessment and treatment plan with the patient. The patient was provided an opportunity to ask questions and all were answered. The patient agreed with the plan and demonstrated an understanding of the instructions.  The patient was advised to call back or seek an in-person evaluation if the symptoms worsen or if the condition fails to improve as anticipated.   I provided 15 minutes of face-to-face video visit time during this encounter, and > 50% was spent counseling as documented under my assessment & plan.  Earlie Server, MD 06/25/2018 10:49 AM

## 2018-06-25 NOTE — Progress Notes (Signed)
Patient contacted for telehealth visit. No concerns voiced.  

## 2018-06-26 ENCOUNTER — Other Ambulatory Visit: Payer: Medicare Other

## 2018-06-26 ENCOUNTER — Telehealth: Payer: Self-pay | Admitting: Oncology

## 2018-06-26 ENCOUNTER — Ambulatory Visit: Payer: Medicare Other | Admitting: Oncology

## 2018-06-26 DIAGNOSIS — D72820 Lymphocytosis (symptomatic): Secondary | ICD-10-CM | POA: Insufficient documentation

## 2018-06-26 NOTE — Telephone Encounter (Signed)
Called pt for appt pre-screen but got no answer. Left vm msg about screening and new guidelines about mask req, no visitors, screening questions they will be asked, and fever checks °

## 2018-06-28 ENCOUNTER — Telehealth: Payer: Self-pay | Admitting: Family Medicine

## 2018-06-28 NOTE — Telephone Encounter (Signed)
Please contact the patient and let him know I received a message from Dr Tasia Catchings regarding his kidney function.  I would like to have the patient do a follow-up visit sometime this week to see if we can figure out a reason for his kidney function to have gotten slightly worse.

## 2018-06-29 ENCOUNTER — Inpatient Hospital Stay: Payer: Medicare Other

## 2018-06-29 ENCOUNTER — Other Ambulatory Visit: Payer: Self-pay

## 2018-06-29 DIAGNOSIS — N183 Chronic kidney disease, stage 3 unspecified: Secondary | ICD-10-CM

## 2018-06-29 DIAGNOSIS — D72829 Elevated white blood cell count, unspecified: Secondary | ICD-10-CM

## 2018-06-29 DIAGNOSIS — D72820 Lymphocytosis (symptomatic): Secondary | ICD-10-CM

## 2018-06-29 LAB — COMPREHENSIVE METABOLIC PANEL
ALT: 13 U/L (ref 0–44)
AST: 14 U/L — ABNORMAL LOW (ref 15–41)
Albumin: 4 g/dL (ref 3.5–5.0)
Alkaline Phosphatase: 68 U/L (ref 38–126)
Anion gap: 7 (ref 5–15)
BUN: 16 mg/dL (ref 8–23)
CO2: 24 mmol/L (ref 22–32)
Calcium: 8.7 mg/dL — ABNORMAL LOW (ref 8.9–10.3)
Chloride: 107 mmol/L (ref 98–111)
Creatinine, Ser: 1.39 mg/dL — ABNORMAL HIGH (ref 0.61–1.24)
GFR calc Af Amer: 58 mL/min — ABNORMAL LOW (ref >60)
GFR calc non Af Amer: 50 mL/min — ABNORMAL LOW (ref >60)
Glucose, Bld: 102 mg/dL — ABNORMAL HIGH (ref 70–99)
Potassium: 4.2 mmol/L (ref 3.5–5.1)
Sodium: 138 mmol/L (ref 135–145)
Total Bilirubin: 0.6 mg/dL (ref 0.3–1.2)
Total Protein: 7.2 g/dL (ref 6.5–8.1)

## 2018-06-29 LAB — CBC WITH DIFFERENTIAL/PLATELET
Abs Immature Granulocytes: 0.04 10*3/uL (ref 0.00–0.07)
Basophils Absolute: 0.1 10*3/uL (ref 0.0–0.1)
Basophils Relative: 1 %
Eosinophils Absolute: 0.1 10*3/uL (ref 0.0–0.5)
Eosinophils Relative: 1 %
HCT: 45.1 % (ref 39.0–52.0)
Hemoglobin: 15.2 g/dL (ref 13.0–17.0)
Immature Granulocytes: 0 %
Lymphocytes Relative: 15 %
Lymphs Abs: 1.4 10*3/uL (ref 0.7–4.0)
MCH: 30.4 pg (ref 26.0–34.0)
MCHC: 33.7 g/dL (ref 30.0–36.0)
MCV: 90.2 fL (ref 80.0–100.0)
Monocytes Absolute: 1.1 10*3/uL — ABNORMAL HIGH (ref 0.1–1.0)
Monocytes Relative: 12 %
Neutro Abs: 6.9 10*3/uL (ref 1.7–7.7)
Neutrophils Relative %: 71 %
Platelets: 278 10*3/uL (ref 150–400)
RBC: 5 MIL/uL (ref 4.22–5.81)
RDW: 13 % (ref 11.5–15.5)
WBC: 9.6 10*3/uL (ref 4.0–10.5)
nRBC: 0 % (ref 0.0–0.2)

## 2018-06-30 LAB — MULTIPLE MYELOMA PANEL, SERUM
Albumin SerPl Elph-Mcnc: 3.6 g/dL (ref 2.9–4.4)
Albumin/Glob SerPl: 1.3 (ref 0.7–1.7)
Alpha 1: 0.2 g/dL (ref 0.0–0.4)
Alpha2 Glob SerPl Elph-Mcnc: 0.6 g/dL (ref 0.4–1.0)
B-Globulin SerPl Elph-Mcnc: 0.9 g/dL (ref 0.7–1.3)
Gamma Glob SerPl Elph-Mcnc: 1.2 g/dL (ref 0.4–1.8)
Globulin, Total: 2.9 g/dL (ref 2.2–3.9)
IgA: 419 mg/dL (ref 61–437)
IgG (Immunoglobin G), Serum: 1235 mg/dL (ref 603–1613)
IgM (Immunoglobulin M), Srm: 55 mg/dL (ref 15–143)
Total Protein ELP: 6.5 g/dL (ref 6.0–8.5)

## 2018-06-30 LAB — KAPPA/LAMBDA LIGHT CHAINS
Kappa free light chain: 66.3 mg/L — ABNORMAL HIGH (ref 3.3–19.4)
Kappa, lambda light chain ratio: 0.61 (ref 0.26–1.65)
Lambda free light chains: 108.4 mg/L — ABNORMAL HIGH (ref 5.7–26.3)

## 2018-07-08 ENCOUNTER — Encounter: Payer: Medicare Other | Admitting: Family Medicine

## 2018-07-08 ENCOUNTER — Other Ambulatory Visit: Payer: Self-pay

## 2018-07-08 NOTE — Telephone Encounter (Signed)
lmtcb to schedule a appointmetn within a week with the patient to discuss kidney functions.  Nina,cma

## 2018-07-08 NOTE — Telephone Encounter (Signed)
Lm for pt to call back and reschedule appt for  today 7/1

## 2018-07-09 ENCOUNTER — Ambulatory Visit (INDEPENDENT_AMBULATORY_CARE_PROVIDER_SITE_OTHER): Payer: Medicare Other | Admitting: Family Medicine

## 2018-07-09 DIAGNOSIS — J019 Acute sinusitis, unspecified: Secondary | ICD-10-CM | POA: Diagnosis not present

## 2018-07-09 MED ORDER — FLUTICASONE PROPIONATE 50 MCG/ACT NA SUSP: 2.0000 | Freq: Every day | NASAL | 0 refills | Status: DC

## 2018-07-09 MED ORDER — AZITHROMYCIN 250 MG PO TABS: ORAL_TABLET | ORAL | 0 refills | Status: DC

## 2018-07-09 NOTE — Progress Notes (Signed)
Patient ID: CHARLE Hebert, male   DOB: 12-14-1945, 73 y.o.   MRN: [id number]    Virtual Visit via phone Note  This visit type was conducted due to national recommendations for restrictions regarding the COVID-19 pandemic (e.g. social distancing).  This format is felt to be most appropriate for this patient at this time.  All issues noted in this document were discussed and addressed.  No physical exam was performed (except for noted visual exam findings with Video Visits).   I connected with Christian Hebert today at  1:40 PM EDT by telephone and verified that I am speaking with the correct person using two identifiers. Location patient: home Location provider: work or home office Persons participating in the virtual visit: patient, provider  I discussed the limitations, risks, security and privacy concerns of performing an evaluation and management service by telephone and the availability of in person appointments. I also discussed with the patient that there may be a patient responsible charge related to this service. The patient expressed understanding and agreed to proceed.  HPI:  Patient and I connected via phone due to sinus congestion and facial pain around eyes and above teeth for 1 week. Also has had some yellow nasal drainage.  No fever or chills. No cough. No SOB or wheeze. No CP. No GU or GI issues.   Patient has not been anywhere except the gas station and his home the past 3 months. His wife does the grocery shopping. [place] He denies being around anyone who is being tested for or has been confirmed +covid19  ROS: See pertinent positives and negatives per HPI.  Past Medical History:  Diagnosis Date  . COPD (chronic obstructive pulmonary disease) ([place])   . Coronary atherosclerosis    a. Treadmill Myoview 12/2015: Exercised for 8 minutes unable to achieve target heart rate, Lexiscan without significant ischemia, EF 55-65%  . Diverticulitis   . GERD  (gastroesophageal reflux disease)   . Inguinal hernia   . Personal history of tobacco use, presenting hazards to health 07/20/2015    Past Surgical History:  Procedure Laterality Date  . HERNIA REPAIR     bilateral inguinal   . INGUINAL HERNIA REPAIR Bilateral 09/12/2016   Procedure: LAPAROSCOPIC BILATERAL INGUINAL HERNIA REPAIR;  Surgeon: Florene Glen, MD;  Location: [facility] ORS;  Service: General;  Laterality: Bilateral;  . TONSILLECTOMY      Family History  Problem Relation Age of Onset  . Stroke Mother   . Hypertension Mother   . Heart disease Father   . Hypertension Father   . Lung cancer Father   . Heart disease Sister     Social History   Tobacco Use  . Smoking status: Former Smoker    Packs/day: 1.00    Years: 50.00    Pack years: 50.00    Types: Cigarettes    Quit date: 01/08/2016    Years since quitting: 2.5  . Smokeless tobacco: Never Used  Substance Use Topics  . Alcohol use: No    Alcohol/week: 0.0 standard drinks    Current Outpatient Medications:  Marland Kitchen  Multiple Vitamins-Minerals (MULTIVITAMIN WITH MINERALS) tablet, Take 1 tablet by mouth daily., Disp: , Rfl:   EXAM:  GENERAL: alert, oriented, Sounds well and in no acute distress  LUNGS: Speaking in full sentences, no signs of respiratory distress, breathing rate appears normal, no obvious gross SOB, gasping, coughing or wheezing  PSYCH/NEURO: pleasant and cooperative, no obvious depression or anxiety, speech  and thought processing grossly intact  ASSESSMENT AND PLAN:  Discussed the following assessment and plan:  Sinusitis-patient symptoms do sound consistent with an acute sinusitis.  Patient has not been exposed to anyone who has been sick recently and has not gone anywhere other than gas station in his home, so his risk factor for coming to contact with someone with COVID-19 is low.  Patient and I jointly agreed that he does not require COVID-19 testing.  We will treat him with azithromycin to cover  sinus infection and also he will use Flonase nasal spray to help reduce sinus congestion.   I discussed the assessment and treatment plan with the patient. The patient was provided an opportunity to ask questions and all were answered. The patient agreed with the plan and demonstrated an understanding of the instructions.   The patient was advised to call back or seek an in-person evaluation if the symptoms worsen or if the condition fails to improve as anticipated.  I provided 13 minutes of non-face-to-face time during this encounter.   Jodelle Green, FNP

## 2018-07-09 NOTE — Progress Notes (Signed)
Opened in error

## 2018-07-13 ENCOUNTER — Other Ambulatory Visit: Payer: Self-pay

## 2018-07-13 ENCOUNTER — Ambulatory Visit (INDEPENDENT_AMBULATORY_CARE_PROVIDER_SITE_OTHER): Payer: Medicare Other | Admitting: Family Medicine

## 2018-07-13 DIAGNOSIS — J0110 Acute frontal sinusitis, unspecified: Secondary | ICD-10-CM

## 2018-07-13 DIAGNOSIS — N183 Chronic kidney disease, stage 3 unspecified: Secondary | ICD-10-CM

## 2018-07-13 NOTE — Progress Notes (Signed)
Virtual Visit via telephone Note  This visit type was conducted due to national recommendations for restrictions regarding the COVID-19 pandemic (e.g. social distancing).  This format is felt to be most appropriate for this patient at this time.  All issues noted in this document were discussed and addressed.  No physical exam was performed (except for noted visual exam findings with Video Visits).   I connected with Christian Hebert today at  3:15 PM EDT by telephone and verified that I am speaking with the correct person using two identifiers. Location patient: home Location provider: home office Persons participating in the virtual visit: patient, provider, Eligah Anello (wife)  I discussed the limitations, risks, security and privacy concerns of performing an evaluation and management service by telephone and the availability of in person appointments. I also discussed with the patient that there may be a patient responsible charge related to this service. The patient expressed understanding and agreed to proceed.  Interactive audio and video telecommunications were attempted between this provider and patient, however failed, due to patient having technical difficulties OR patient did not have access to video capability.  We continued and completed visit with audio only.  Reason for visit: Follow-up.  HPI: CKD stage III: Patient recently noted to have decreased kidney function.  This has been persistent over several checks.  He notes he is urinating normally and does not have to strain.  He does feel as though he empties his bladder fully.  He is not taking any NSAIDs.  No vomiting or diarrhea.  He did have lab work through oncology that revealed elevated kappa and lambda light chains.  Sinus infection: Patient has been taking his Z-Pak and he feels significantly better.  Has sinus headache has improved quite a bit.   ROS: See pertinent positives and negatives per HPI.  Past Medical  History:  Diagnosis Date  . COPD (chronic obstructive pulmonary disease) ([place])   . Coronary atherosclerosis    a. Treadmill Myoview 12/2015: Exercised for 8 minutes unable to achieve target heart rate, Lexiscan without significant ischemia, EF 55-65%  . Diverticulitis   . GERD (gastroesophageal reflux disease)   . Inguinal hernia   . Personal history of tobacco use, presenting hazards to health 07/20/2015    Past Surgical History:  Procedure Laterality Date  . HERNIA REPAIR     bilateral inguinal   . INGUINAL HERNIA REPAIR Bilateral 09/12/2016   Procedure: LAPAROSCOPIC BILATERAL INGUINAL HERNIA REPAIR;  Surgeon: Florene Glen, MD;  Location: [facility] ORS;  Service: General;  Laterality: Bilateral;  . TONSILLECTOMY      Family History  Problem Relation Age of Onset  . Stroke Mother   . Hypertension Mother   . Heart disease Father   . Hypertension Father   . Lung cancer Father   . Heart disease Sister     SOCIAL HX: Former smoker   Current Outpatient Medications:  .  azithromycin (ZITHROMAX) 250 MG tablet, Take 2 tablets on day 1, take 1 tablet on days 2-5, Disp: 6 tablet, Rfl: 0 .  fluticasone (FLONASE) 50 MCG/ACT nasal spray, Place 2 sprays into both nostrils daily., Disp: 16 g, Rfl: 0 .  Multiple Vitamins-Minerals (MULTIVITAMIN WITH MINERALS) tablet, Take 1 tablet by mouth daily., Disp: , Rfl:   EXAM: This is a telehealth telephone visit and thus no physical exam was completed.  ASSESSMENT AND PLAN:  Discussed the following assessment and plan:  Chronic kidney disease (CKD), stage III (moderate) (HCC) Undetermined cause at  this time.  We will check a renal ultrasound.  We will check with his oncologist regarding the kappa and lambda light chain levels.  Sinusitis Improved.  He will complete his course of antibiotics.    I discussed the assessment and treatment plan with the patient. The patient was provided an opportunity to ask questions and all were answered. The  patient agreed with the plan and demonstrated an understanding of the instructions.   The patient was advised to call back or seek an in-person evaluation if the symptoms worsen or if the condition fails to improve as anticipated.  I provided 15 minutes of non-face-to-face time during this encounter.   [name], MD

## 2018-07-14 DIAGNOSIS — N183 Chronic kidney disease, stage 3 unspecified: Secondary | ICD-10-CM | POA: Insufficient documentation

## 2018-07-14 DIAGNOSIS — J329 Chronic sinusitis, unspecified: Secondary | ICD-10-CM | POA: Insufficient documentation

## 2018-07-14 NOTE — Assessment & Plan Note (Signed)
Improved.  He will complete his course of antibiotics.

## 2018-07-14 NOTE — Assessment & Plan Note (Addendum)
Undetermined cause at this time.  We will check a renal ultrasound.  We will check with his oncologist regarding the kappa and lambda light chain levels.

## 2018-07-14 NOTE — Telephone Encounter (Signed)
Pt rescheduled.  Nina,cma

## 2018-07-21 ENCOUNTER — Ambulatory Visit: Payer: Medicare Other

## 2018-07-22 ENCOUNTER — Ambulatory Visit
Admission: RE | Admit: 2018-07-22 | Discharge: 2018-07-22 | Disposition: A | Discharge status: Home / Self Care | Payer: Medicare Other | Source: Ambulatory Visit | Attending: Family Medicine | Admitting: Family Medicine

## 2018-07-22 ENCOUNTER — Other Ambulatory Visit: Payer: Self-pay

## 2018-07-22 DIAGNOSIS — N183 Chronic kidney disease, stage 3 unspecified: Secondary | ICD-10-CM

## 2018-07-28 ENCOUNTER — Other Ambulatory Visit: Payer: Self-pay | Admitting: Family Medicine

## 2018-07-28 DIAGNOSIS — N179 Acute kidney failure, unspecified: Secondary | ICD-10-CM

## 2018-07-31 ENCOUNTER — Other Ambulatory Visit: Payer: Self-pay | Admitting: Family Medicine

## 2018-07-31 DIAGNOSIS — J019 Acute sinusitis, unspecified: Secondary | ICD-10-CM

## 2018-11-19 IMAGING — CT CT ABD-PELV W/ CM
2 of 5 series · 15 of 46 positions shown, 17 images · IV contrast (APPLIED)
Comparison: Aortic ultrasound 10/20/2015

CLINICAL DATA: Acute right lower quadrant pain.

EXAM:
CT ABDOMEN AND PELVIS WITH CONTRAST
TECHNIQUE: Multidetector CT imaging of the abdomen and pelvis was performed
using the standard protocol following bolus administration of
intravenous contrast.
CONTRAST:  100mL BR06U2-VDD IOPAMIDOL (BR06U2-VDD) INJECTION 61%

[Series 2: routine abd/pel with · axial · 0.80mm/px · z∈[-508,-112]mm · 12 of 89 slices shown, 14 images]
[im 5/89  soft-tissue]
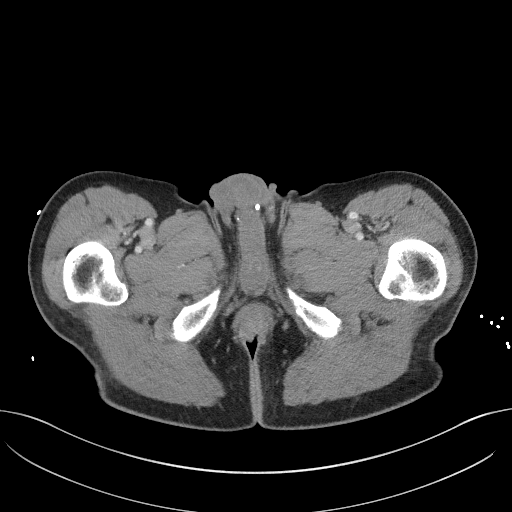
[im 5/89  bone]
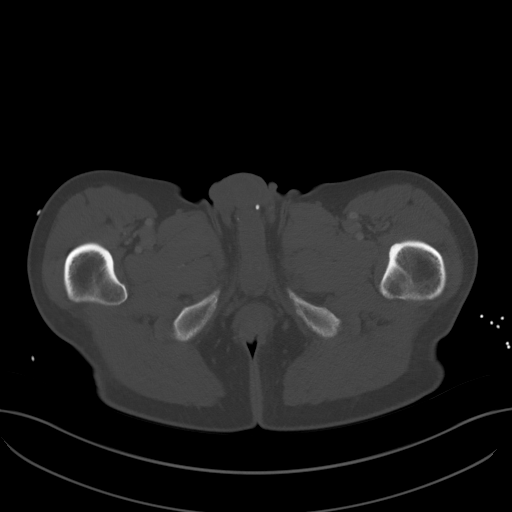
[im 14/89  soft-tissue]
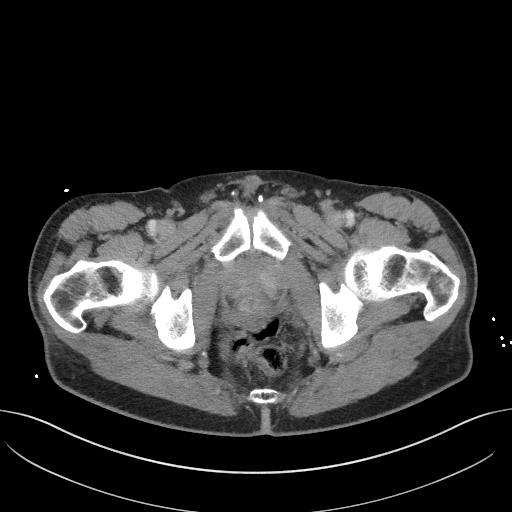
[im 19/89  soft-tissue]
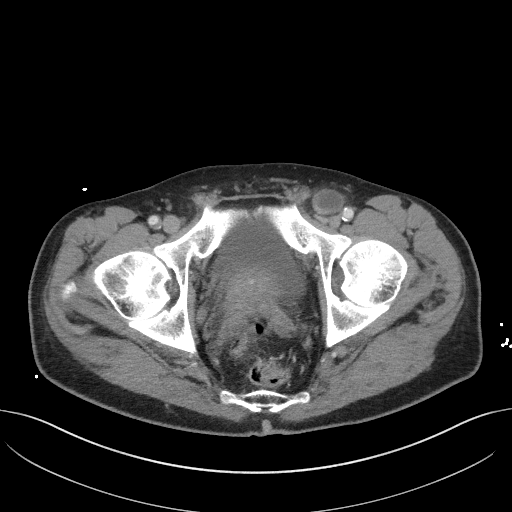
[im 28/89  soft-tissue]
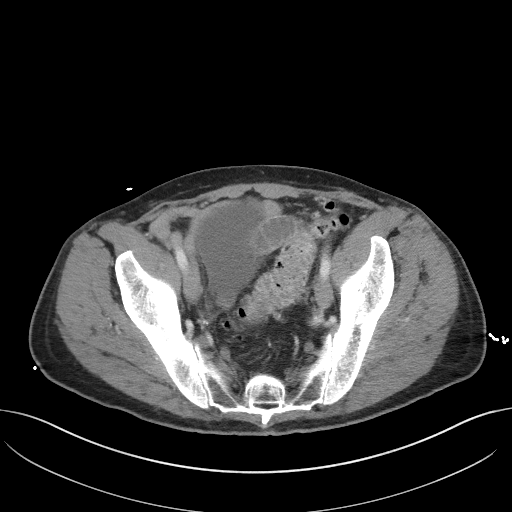
[im 33/89  soft-tissue]
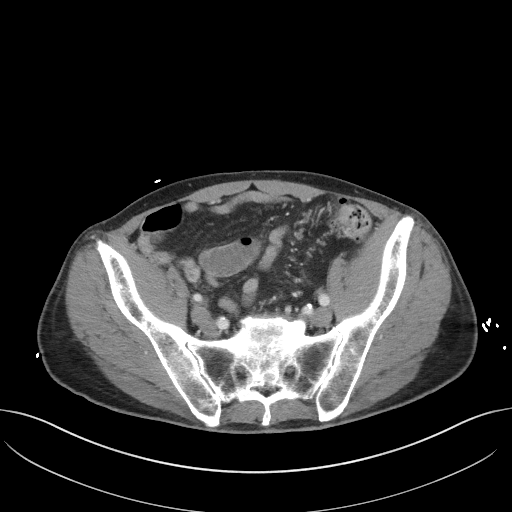
[im 42/89  soft-tissue]
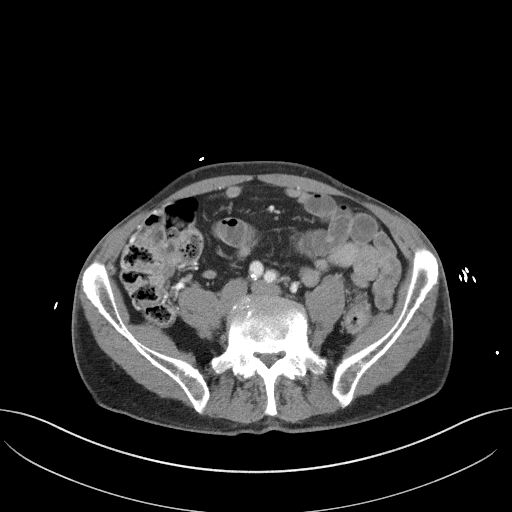
[im 47/89  soft-tissue]
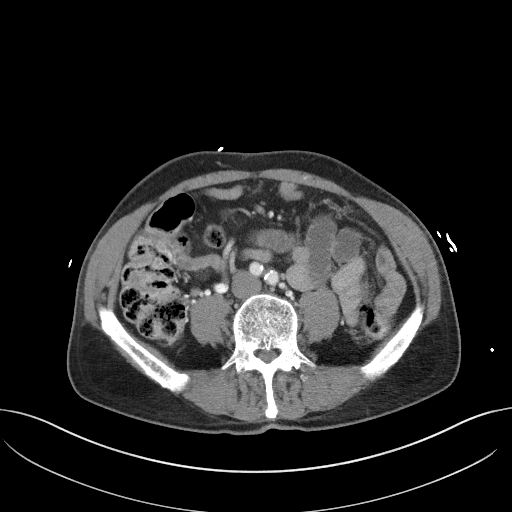
[im 56/89  soft-tissue]
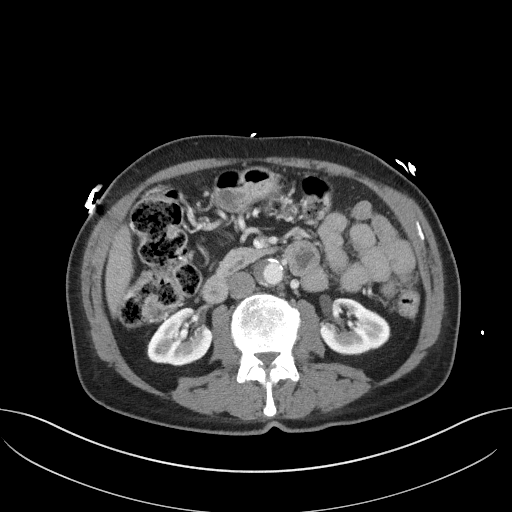
[im 61/89  soft-tissue]
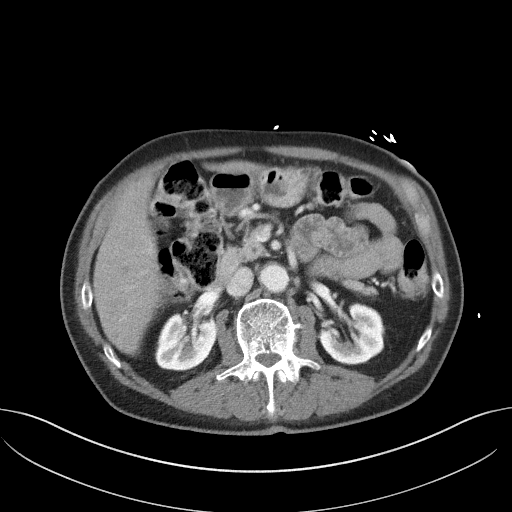
[im 61/89  bone]
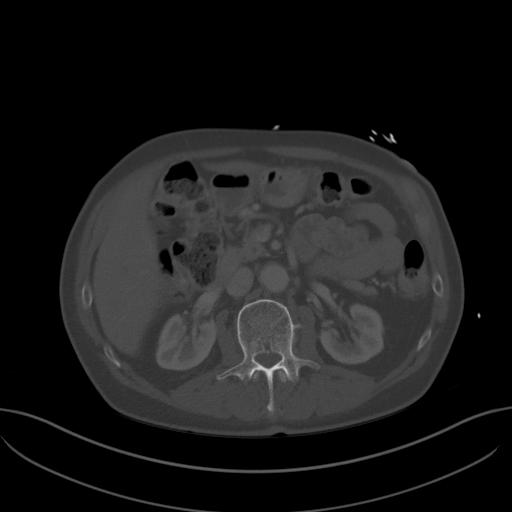
[im 70/89  soft-tissue]
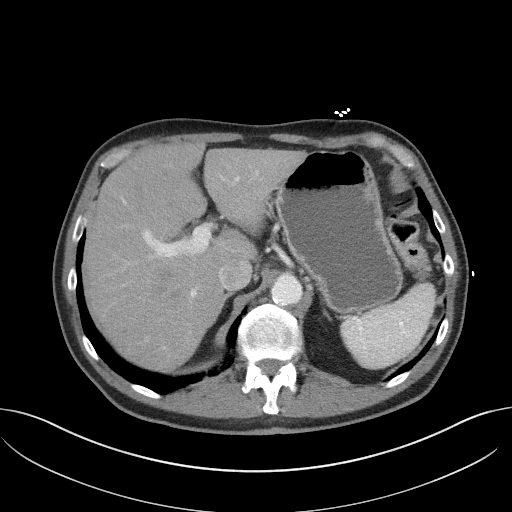
[im 75/89  soft-tissue]
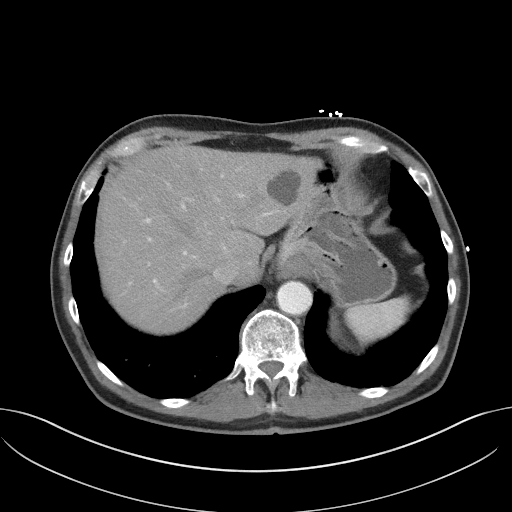
[im 84/89  soft-tissue]
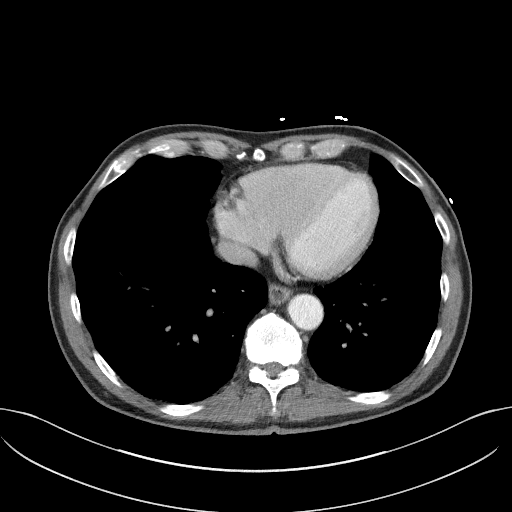

[Series 5: coronal st · coronal · 0.68mm/px · 3 of 81 slices shown]
[im 27/81  soft-tissue]
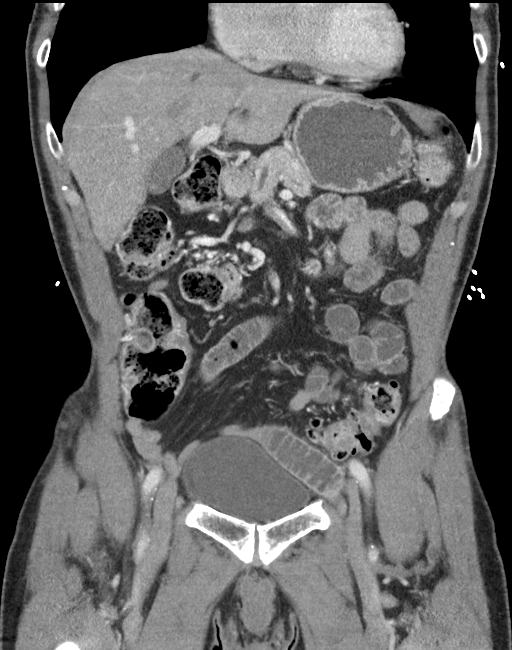
[im 36/81  soft-tissue]
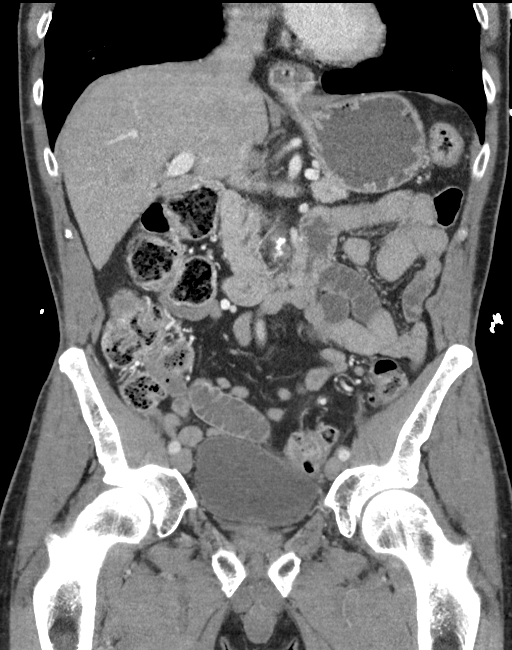
[im 45/81  soft-tissue]
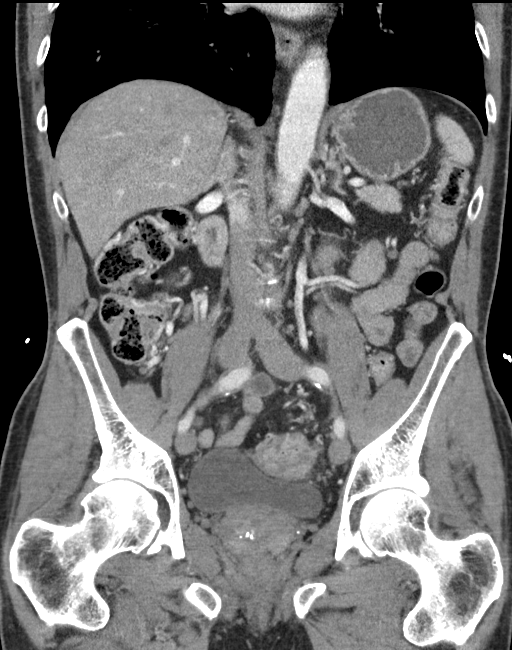

[15 of 46 positions shown; findings below may reference images not displayed]

FINDINGS: Lower chest: Emphysema. Minimal dependent atelectasis. Small hiatal
hernia with fluid in the distal esophagus.

Hepatobiliary: 3.1 cm cyst the left hepatic lobe. Tiny low-density
lesion in the right hepatic dome is too small to characterize.
Gallbladder physiologically distended, no calcified stone. No
biliary dilatation.

Pancreas: No ductal dilatation or inflammation.

Spleen: Normal in size without focal abnormality.

Adrenals/Urinary Tract: Normal adrenal glands. Homogeneous renal
enhancement with symmetric renal excretion on delayed phase imaging.
Simple cyst in the mid right kidney measures 16 mm. Additional
smaller cortical cyst in the lower right kidney. Ureters are
decompressed. Urinary bladder is physiologically distended, no
bladder wall thickening.

Stomach/Bowel: Small hiatal hernia containing fluid. Stomach mildly
distended containing fluid. Proximal small bowel is decompressed,
more distal small bowel is fluid-filled and prominent in caliber.
There is a left inguinal hernia containing dilated small bowel, the
exiting small bowel is decompressed. Minimal edema in the hernia
sac. No evidence appendicitis, appendix tentatively identified and
normal. No right lower quadrant inflammation. Moderate colonic stool
burden with tortuosity. Sigmoid colonic diverticulosis. Mild mural
wall thickening with sigmoid colon. No discrete pericolonic
inflammation.

Vascular/Lymphatic: There is ectasia of the infrarenal abdominal
aorta maximal dimension 2.6 x 2.6 cm. There is calcified and
noncalcified plaque with lateral a centric mural thrombus in the
infrarenal portion. No aneurysm. Moderate atherosclerosis throughout
the iliac vessels. No abdominal or pelvic adenopathy.

Reproductive: Enlarged prostate gland spanning 5.9 cm.

Other: No ascites, free air or perforation.

Musculoskeletal: There are no acute or suspicious osseous
abnormalities.
IMPRESSION: 1. Left inguinal hernia contains fluid-filled small bowel loops with
surrounding inflammation, with early developing small bowel
obstruction. No perforation or abscess.
2. Sigmoid colonic diverticulosis. Mural thickening of sigmoid colon
is likely related to chronic inflammation, recommend up-to-date
colonoscopy to exclude neoplasm as cause of colonic wall thickening.
No acute pericolonic inflammation.
3. Atherosclerotic ectatic abdominal aorta without aneurysm maximal
dimension 2.6 cm, at risk for aneurysm development. Recommend
followup by ultrasound in 5 years. This recommendation follows ACR
consensus guidelines: White Paper of the ACR Incidental Findings
Committee II on Vascular Findings. [HOSPITAL] [id number];
4. Hepatic and right renal cysts.  Small hiatal hernia.

## 2018-12-08 ENCOUNTER — Encounter: Payer: Self-pay | Admitting: Cardiovascular Disease

## 2018-12-08 ENCOUNTER — Ambulatory Visit (INDEPENDENT_AMBULATORY_CARE_PROVIDER_SITE_OTHER): Payer: Medicare Other | Admitting: Cardiovascular Disease

## 2018-12-08 ENCOUNTER — Other Ambulatory Visit: Payer: Self-pay

## 2018-12-08 VITALS — BP 130/82 | HR 61 | Temp 97.2°F | Ht 71.0 in | Wt 147.2 lb

## 2018-12-08 DIAGNOSIS — R0602 Shortness of breath: Secondary | ICD-10-CM

## 2018-12-08 DIAGNOSIS — Z720 Tobacco use: Secondary | ICD-10-CM

## 2018-12-08 DIAGNOSIS — R0989 Other specified symptoms and signs involving the circulatory and respiratory systems: Secondary | ICD-10-CM

## 2018-12-08 DIAGNOSIS — I2584 Coronary atherosclerosis due to calcified coronary lesion: Secondary | ICD-10-CM

## 2018-12-08 DIAGNOSIS — I2510 Atherosclerotic heart disease of native coronary artery without angina pectoris: Secondary | ICD-10-CM

## 2018-12-08 NOTE — Progress Notes (Signed)
Cardiology Office Note   Date:  12/08/2018   ID:  Christian Hebert, DOB 1945-10-14, MRN [id number]  PCP:  Christian Haven, MD  Cardiologist:   Christian Sacramento, MD   Chief Complaint  Patient presents with  . office visit    12 mo F/U; Meds verbally reviewed with patient.      History of Present Illness: Christian Hebert is a 73 y.o. male who is here today for a follow-up visit regarding coronary atherosclerosis and asymptomatic bradycardia. He has known history of COPD and tobacco use.  He is known to have aortic and coronary calcifications in the RCA and LAD distribution noted on previous CT scan of the lungs.  Abdominal aortic ultrasound showed no evidence of aortic aneurysm.   He has no hypertension or diabetes. He underwent a treadmill Myoview in July 2018 which was normal. The patient has known history of mild asymptomatic bradycardia.  He continues to smoke half a pack per day.  He reports worsening exertional dyspnea and weakness.  In addition, he has no appetite and he has lost some weight over the last year.  He did have some abnormal kidney function and there was some concerns about possible multiple myeloma but he was seen by oncology and testing did not confirm that.  He denies chest pain.   Past Medical History:  Diagnosis Date  . COPD (chronic obstructive pulmonary disease) ([place])   . Coronary atherosclerosis    a. Treadmill Myoview 12/2015: Exercised for 8 minutes unable to achieve target heart rate, Lexiscan without significant ischemia, EF 55-65%  . Diverticulitis   . GERD (gastroesophageal reflux disease)   . Inguinal hernia   . Personal history of tobacco use, presenting hazards to health 07/20/2015    Past Surgical History:  Procedure Laterality Date  . HERNIA REPAIR     bilateral inguinal   . INGUINAL HERNIA REPAIR Bilateral 09/12/2016   Procedure: LAPAROSCOPIC BILATERAL INGUINAL HERNIA REPAIR;  Surgeon: Florene Glen, MD;  Location: [facility] ORS;   Service: General;  Laterality: Bilateral;  . TONSILLECTOMY       Current Outpatient Medications  Medication Sig Dispense Refill  . aspirin 81 MG chewable tablet Chew by mouth daily.    . Multiple Vitamins-Minerals (MULTIVITAMIN WITH MINERALS) tablet Take 1 tablet by mouth daily.    Marland Kitchen azithromycin (ZITHROMAX) 250 MG tablet Take 2 tablets on day 1, take 1 tablet on days 2-5 6 tablet 0  . fluticasone (FLONASE) 50 MCG/ACT nasal spray SPRAY 2 SPRAYS INTO EACH NOSTRIL EVERY DAY 16 mL 0   No current facility-administered medications for this visit.     Allergies:   Oxycodone    Social History:  The patient  reports that he quit smoking about 2 years ago. His smoking use included cigarettes. He has a 50.00 pack-year smoking history. He has never used smokeless tobacco. He reports that he does not drink alcohol or use drugs.   Family History:  The patient's family history includes Heart disease in his father and sister; Hypertension in his father and mother; Lung cancer in his father; Stroke in his mother.    ROS:  Please see the history of present illness.   Otherwise, review of systems are positive for none.   All other systems are reviewed and negative.    PHYSICAL EXAM: VS:  BP 130/82 (BP Location: Left Arm, Patient Position: Sitting, Cuff Size: Normal)   Pulse 61   Temp (!) 97.2 F (36.2 C)  Ht _0  (1.803 m)   Wt 147 lb 4 oz (66.8 kg)   SpO2 94%   BMI 20.54 kg/m  , BMI Body mass index is 20.54 kg/m. GEN: Well nourished, well developed, in no acute distress  HEENT: normal  Neck: no JVD, , or masses.  Faint left carotid bruit Cardiac: RRR; no murmurs, rubs, or gallops,no edema  Respiratory:  clear to auscultation bilaterally, normal work of breathing GI: soft, nontender, nondistended, + BS MS: no deformity or atrophy  Skin: warm and dry, no rash Neuro:  Strength and sensation are intact Psych: euthymic mood, full affect Distal pulses are normal.  EKG:  EKG is  ordered  today. EKG showed normal sinus rhythm with no significant ST or T wave changes.   Recent Labs: 06/29/2018: ALT 13; BUN 16; Creatinine, Ser 1.39; Hemoglobin 15.2; Platelets 278; Potassium 4.2; Sodium 138    Lipid Panel    Component Value Date/Time   CHOL 162 07/16/2016 1354   TRIG 173.0 (H) 07/16/2016 1354   HDL 26.50 (L) 07/16/2016 1354   CHOLHDL 6 07/16/2016 1354   VLDL 34.6 07/16/2016 1354   LDLCALC 101 (H) 07/16/2016 1354   LDLDIRECT 106.0 04/23/2017 1343      Wt Readings from Last 3 Encounters:  12/08/18 147 lb 4 oz (66.8 kg)  01/27/18 160 lb 1.9 oz (72.6 kg)  01/02/18 155 lb (70.3 kg)       PAD Screen 12/12/2015  Previous PAD dx? No  Previous surgical procedure? No  Pain with walking? No  Feet/toe relief with dangling? No  Painful, non-healing ulcers? No  Extremities discolored? No      ASSESSMENT AND PLAN:  1.  Coronary atherosclerosis: This was noted on CT scan of the lungs.  Negative nuclear stress test was negative in 2018.  Continue low-dose aspirin.  He reports worsening exertional dyspnea without chest pain.  I suspect that this is likely due to continued smoking.  Nonetheless, I am going to obtain an echocardiogram to evaluate ejection fraction, diastolic function and pulmonary pressure.  2. Borderline hyperlipidemia: Most recent LDL was 106.  I think treatment with a statin is reasonable but not strongly indicated.  3.  Tobacco use I again discussed with him the importance of smoking cessation.  I offered to prescribe Chantix to help him quit but he wants to make up his mind first.  4.  Left carotid bruit: I requested carotid Doppler.   Disposition:   FU with me in 12 months.   Signed,  Christian Sacramento, MD  12/08/2018 3:34 PM    [place]

## 2018-12-08 NOTE — Patient Instructions (Signed)
Medication Instructions:  Your physician recommends that you continue on your current medications as directed. Please refer to the Current Medication list given to you today.  *If you need a refill on your cardiac medications before your next appointment, please call your pharmacy*  Lab Work: None ordered If you have labs (blood work) drawn today and your tests are completely normal, you will receive your results only by: Marland Kitchen MyChart Message (if you have MyChart) OR . A paper copy in the mail If you have any lab test that is abnormal or we need to change your treatment, we will call you to review the results.  Testing/Procedures: Your physician has requested that you have an echocardiogram. Echocardiography is a painless test that uses sound waves to create images of your heart. It provides your doctor with information about the size and shape of your heart and how well your heart's chambers and valves are working. This procedure takes approximately one hour. There are no restrictions for this procedure.  Your physician has requested that you have a carotid duplex. This test is an ultrasound of the carotid arteries in your neck. It looks at blood flow through these arteries that supply the brain with blood. Allow one hour for this exam. There are no restrictions or special instructions.    Follow-Up: At [facility], you and your health needs are our priority.  As part of our continuing mission to provide you with exceptional heart care, we have created designated Provider Care Teams.  These Care Teams include your primary Cardiologist (physician) and Advanced Practice Providers (APPs -  Physician Assistants and Nurse Practitioners) who all work together to provide you with the care you need, when you need it.  Your next appointment:   12 month(s)  The format for your next appointment:   In Person  Provider:    You may see  Dr. Fletcher Anon or one of the following Advanced Practice Providers  on your designated Care Team:    Murray Hodgkins, NP  Christell Faith, PA-C  Marrianne Mood, PA-C   Other Instructions N/A

## 2018-12-23 ENCOUNTER — Other Ambulatory Visit: Payer: Self-pay

## 2018-12-24 ENCOUNTER — Inpatient Hospital Stay: Payer: Medicare Other | Attending: Oncology

## 2018-12-24 ENCOUNTER — Other Ambulatory Visit: Payer: Self-pay

## 2018-12-24 DIAGNOSIS — D72829 Elevated white blood cell count, unspecified: Secondary | ICD-10-CM

## 2018-12-24 DIAGNOSIS — D72820 Lymphocytosis (symptomatic): Secondary | ICD-10-CM

## 2018-12-24 LAB — CBC WITH DIFFERENTIAL/PLATELET
Abs Immature Granulocytes: 0.05 10*3/uL (ref 0.00–0.07)
Basophils Absolute: 0.1 10*3/uL (ref 0.0–0.1)
Basophils Relative: 1 %
Eosinophils Absolute: 0.2 10*3/uL (ref 0.0–0.5)
Eosinophils Relative: 2 %
HCT: 45.3 % (ref 39.0–52.0)
Hemoglobin: 14.8 g/dL (ref 13.0–17.0)
Immature Granulocytes: 1 %
Lymphocytes Relative: 15 %
Lymphs Abs: 1.6 10*3/uL (ref 0.7–4.0)
MCH: 30.1 pg (ref 26.0–34.0)
MCHC: 32.7 g/dL (ref 30.0–36.0)
MCV: 92.1 fL (ref 80.0–100.0)
Monocytes Absolute: 1.4 10*3/uL — ABNORMAL HIGH (ref 0.1–1.0)
Monocytes Relative: 13 %
Neutro Abs: 7.5 10*3/uL (ref 1.7–7.7)
Neutrophils Relative %: 68 %
Platelets: 312 10*3/uL (ref 150–400)
RBC: 4.92 MIL/uL (ref 4.22–5.81)
RDW: 12.8 % (ref 11.5–15.5)
WBC: 10.7 10*3/uL — ABNORMAL HIGH (ref 4.0–10.5)
nRBC: 0 % (ref 0.0–0.2)

## 2018-12-24 LAB — COMPREHENSIVE METABOLIC PANEL
ALT: 13 U/L (ref 0–44)
AST: 14 U/L — ABNORMAL LOW (ref 15–41)
Albumin: 3.6 g/dL (ref 3.5–5.0)
Alkaline Phosphatase: 63 U/L (ref 38–126)
Anion gap: 7 (ref 5–15)
BUN: 14 mg/dL (ref 8–23)
CO2: 25 mmol/L (ref 22–32)
Calcium: 8.9 mg/dL (ref 8.9–10.3)
Chloride: 106 mmol/L (ref 98–111)
Creatinine, Ser: 1.29 mg/dL — ABNORMAL HIGH (ref 0.61–1.24)
GFR calc Af Amer: >60 mL/min (ref >60)
GFR calc non Af Amer: 55 mL/min — ABNORMAL LOW (ref >60)
Glucose, Bld: 103 mg/dL — ABNORMAL HIGH (ref 70–99)
Potassium: 4.3 mmol/L (ref 3.5–5.1)
Sodium: 138 mmol/L (ref 135–145)
Total Bilirubin: 0.6 mg/dL (ref 0.3–1.2)
Total Protein: 6.8 g/dL (ref 6.5–8.1)

## 2018-12-24 LAB — LACTATE DEHYDROGENASE: LDH: 105 U/L (ref 98–192)

## 2018-12-25 ENCOUNTER — Inpatient Hospital Stay (HOSPITAL_BASED_OUTPATIENT_CLINIC_OR_DEPARTMENT_OTHER): Payer: Medicare Other | Admitting: Oncology

## 2018-12-25 ENCOUNTER — Telehealth: Payer: Self-pay | Admitting: *Deleted

## 2018-12-25 ENCOUNTER — Other Ambulatory Visit: Payer: Medicare Other

## 2018-12-25 ENCOUNTER — Encounter: Payer: Self-pay | Admitting: Oncology

## 2018-12-25 DIAGNOSIS — R634 Abnormal weight loss: Secondary | ICD-10-CM

## 2018-12-25 DIAGNOSIS — D72829 Elevated white blood cell count, unspecified: Secondary | ICD-10-CM | POA: Diagnosis not present

## 2018-12-25 DIAGNOSIS — D72820 Lymphocytosis (symptomatic): Secondary | ICD-10-CM

## 2018-12-25 DIAGNOSIS — N1831 Chronic kidney disease, stage 3a: Secondary | ICD-10-CM

## 2018-12-25 DIAGNOSIS — Z87891 Personal history of nicotine dependence: Secondary | ICD-10-CM

## 2018-12-25 NOTE — Telephone Encounter (Signed)
Lab/MD mid January per 12/25/18 los. Called pt and made him aware of his sched lab/MD visit on 01/21/19 starting @ 9:45.the patient is aware.

## 2018-12-25 NOTE — Progress Notes (Signed)
HEMATOLOGY-ONCOLOGY TeleHEALTH VISIT PROGRESS NOTE  I connected with Christian Hebert on 12/25/18 at 10:45 AM EST by video enabled telemedicine visit and verified that I am speaking with the correct person using two identifiers. I discussed the limitations, risks, security and privacy concerns of performing an evaluation and management service by telemedicine and the availability of in-person appointments. I also discussed with the patient that there may be a patient responsible charge related to this service. The patient expressed understanding and agreed to proceed.   Other persons participating in the visit and their role in the encounter:  Geraldine Solar, [place], check in patient   Janeann Merl, RN, check in patient.   Patient's location: Home  Provider's location: home office Chief Complaint: Follow-up for leukocytosis   INTERVAL HISTORY Christian Hebert is a 73 y.o. male who has above history reviewed by me today presents for follow up visit for management of leukocytosis  problems and complaints are listed below:  Patient reports feeling tired. He has lost weight.  Comparing to January 2020, he has lost 13 pounds.  He reports that his appetite comes and goes. He is due for lung cancer screening CT scan.  He is going to spend Christmas with daughter and prefers lung cancer screening CT to be done after the holiday. Denies any fever or chills Review of Systems  Constitutional: Positive for appetite change, fatigue and unexpected weight change. Negative for chills and fever.  HENT:   Positive for hearing loss. Negative for voice change.   Eyes: Negative for eye problems and icterus.  Respiratory: Negative for chest tightness, cough and shortness of breath.   Cardiovascular: Negative for chest pain and leg swelling.  Gastrointestinal: Negative for abdominal distention and abdominal pain.  Endocrine: Negative for hot flashes.  Genitourinary: Negative for difficulty urinating, dysuria and  frequency.   Musculoskeletal: Negative for arthralgias.  Skin: Negative for itching and rash.  Neurological: Negative for light-headedness and numbness.  Hematological: Negative for adenopathy. Does not bruise/bleed easily.  Psychiatric/Behavioral: Negative for confusion.    Past Medical History:  Diagnosis Date  . COPD (chronic obstructive pulmonary disease) ([place])   . Coronary atherosclerosis    a. Treadmill Myoview 12/2015: Exercised for 8 minutes unable to achieve target heart rate, Lexiscan without significant ischemia, EF 55-65%  . Diverticulitis   . GERD (gastroesophageal reflux disease)   . Inguinal hernia   . Personal history of tobacco use, presenting hazards to health 07/20/2015   Past Surgical History:  Procedure Laterality Date  . HERNIA REPAIR     bilateral inguinal   . INGUINAL HERNIA REPAIR Bilateral 09/12/2016   Procedure: LAPAROSCOPIC BILATERAL INGUINAL HERNIA REPAIR;  Surgeon: Florene Glen, MD;  Location: [facility] ORS;  Service: General;  Laterality: Bilateral;  . TONSILLECTOMY      Family History  Problem Relation Age of Onset  . Stroke Mother   . Hypertension Mother   . Heart disease Father   . Hypertension Father   . Lung cancer Father   . Heart disease Sister     Social History   Socioeconomic History  . Marital status: Married    Spouse name: Not on file  . Number of children: Not on file  . Years of education: Not on file  . Highest education level: Not on file  Occupational History  . Not on file  Tobacco Use  . Smoking status: Former Smoker    Packs/day: 1.00    Years: 50.00    Pack years:  50.00    Types: Cigarettes    Quit date: 01/08/2016    Years since quitting: 2.9  . Smokeless tobacco: Never Used  Substance and Sexual Activity  . Alcohol use: No    Alcohol/week: 0.0 standard drinks  . Drug use: No  . Sexual activity: Not Currently  Other Topics Concern  . Not on file  Social History Narrative  . Not on file   Social  Determinants of Health   Financial Resource Strain:   . Difficulty of Paying Living Expenses: Not on file  Food Insecurity: No Food Insecurity  . Worried About Charity fundraiser in the Last Year: Never true  . Ran Out of Food in the Last Year: Never true  Transportation Needs: No Transportation Needs  . Lack of Transportation (Medical): No  . Lack of Transportation (Non-Medical): No  Physical Activity: Insufficiently Active  . Days of Exercise per Week: 3 days  . Minutes of Exercise per Session: 30 min  Stress: No Stress Concern Present  . Feeling of Stress : Not at all  Social Connections:   . Frequency of Communication with Friends and Family: Not on file  . Frequency of Social Gatherings with Friends and Family: Not on file  . Attends Religious Services: Not on file  . Active Member of Clubs or Organizations: Not on file  . Attends Archivist Meetings: Not on file  . Marital Status: Not on file  Intimate Partner Violence: Not At Risk  . Fear of Current or Ex-Partner: No  . Emotionally Abused: No  . Physically Abused: No  . Sexually Abused: No    Current Outpatient Medications on File Prior to Visit  Medication Sig Dispense Refill  . aspirin 81 MG chewable tablet Chew by mouth daily.    . Multiple Vitamins-Minerals (MULTIVITAMIN WITH MINERALS) tablet Take 1 tablet by mouth daily.     No current facility-administered medications on file prior to visit.    Allergies  Allergen Reactions  . Oxycodone Nausea And Vomiting       Observations/Objective: Today's Vitals   12/25/18 1043  PainSc: 0-No pain   There is no height or weight on file to calculate BMI.  Physical Exam  Constitutional: No distress.  Pulmonary/Chest: Effort normal and breath sounds normal.  Neurological: He is alert.  Psychiatric: Mood normal.    CBC    Component Value Date/Time   WBC 10.7 (H) 12/24/2018 1059   RBC 4.92 12/24/2018 1059   HGB 14.8 12/24/2018 1059   HCT 45.3  12/24/2018 1059   PLT 312 12/24/2018 1059   MCV 92.1 12/24/2018 1059   MCH 30.1 12/24/2018 1059   MCHC 32.7 12/24/2018 1059   RDW 12.8 12/24/2018 1059   LYMPHSABS 1.6 12/24/2018 1059   MONOABS 1.4 (H) 12/24/2018 1059   EOSABS 0.2 12/24/2018 1059   BASOSABS 0.1 12/24/2018 1059    CMP     Component Value Date/Time   NA 138 12/24/2018 1059   K 4.3 12/24/2018 1059   CL 106 12/24/2018 1059   CO2 25 12/24/2018 1059   GLUCOSE 103 (H) 12/24/2018 1059   BUN 14 12/24/2018 1059   CREATININE 1.29 (H) 12/24/2018 1059   CALCIUM 8.9 12/24/2018 1059   PROT 6.8 12/24/2018 1059   ALBUMIN 3.6 12/24/2018 1059   AST 14 (L) 12/24/2018 1059   ALT 13 12/24/2018 1059   ALKPHOS 63 12/24/2018 1059   BILITOT 0.6 12/24/2018 1059   GFRNONAA 55 (L) 12/24/2018 1059  GFRAA >60 12/24/2018 1059     Assessment and Plan: 1. Leukocytosis, unspecified type   2. 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 Stage 3a chronic kidney disease   5. Weight loss      #leukocytosis, total white count 10.7.  Persistent monocytosis. Previous work-up showed monoclonal lymphocytosis.  Lymphocyte count is stable. Cannot rule out CMML.  BCR ABL negative.  JAK2 mutation with reflex negative. Previously on observation as he has been asymptomatic. However he seems to have significant weight loss for the past 6 months. We discussed about bone marrow biopsy for further evaluation. Patient would like to defer work-up right now and meet again with me in January along with his wife for further discussion.  Former smoker, patient is due for lung cancer screening. CKD, stage IIIa, avoid nephrotoxic.  Previous multiple myeloma panel negative for monoclonal protein.  Continue to monitor.. Follow Up Instructions: January 2021   I discussed the assessment and treatment plan with the patient. The patient was provided an opportunity to ask  questions and all were answered. The patient agreed with the plan and demonstrated an understanding of the instructions.  The patient was advised to call back or seek an in-person evaluation if the symptoms worsen or if the condition fails to improve as anticipated.   I provided 15 minutes of face-to-face video visit time during this encounter, and > 50% was spent counseling as documented under my assessment & plan.  Earlie Server, MD 12/25/2018 3:52 PM

## 2019-01-04 ENCOUNTER — Telehealth: Payer: Self-pay | Admitting: *Deleted

## 2019-01-04 DIAGNOSIS — Z87891 Personal history of nicotine dependence: Secondary | ICD-10-CM

## 2019-01-04 NOTE — Telephone Encounter (Signed)
Patient has been notified that annual lung cancer screening low dose CT scan is due currently or will be in near future. Confirmed that patient is within the age range of 55-77, and asymptomatic, (no signs or symptoms of lung cancer). Patient denies illness that would prevent curative treatment for lung cancer if found. Verified smoking history, (current, 53 pack year). The shared decision making visit was done 07/21/15. Patient is agreeable for CT scan being scheduled.

## 2019-01-11 ENCOUNTER — Other Ambulatory Visit: Payer: Medicare Other

## 2019-01-12 ENCOUNTER — Other Ambulatory Visit: Payer: Self-pay

## 2019-01-12 ENCOUNTER — Ambulatory Visit
Admission: RE | Admit: 2019-01-12 | Discharge: 2019-01-12 | Disposition: A | Discharge status: Home / Self Care | Payer: Medicare Other | Source: Ambulatory Visit | Attending: Nurse Practitioner | Admitting: Nurse Practitioner

## 2019-01-12 DIAGNOSIS — F17210 Nicotine dependence, cigarettes, uncomplicated: Secondary | ICD-10-CM | POA: Diagnosis not present

## 2019-01-12 DIAGNOSIS — Z87891 Personal history of nicotine dependence: Secondary | ICD-10-CM

## 2019-01-14 ENCOUNTER — Ambulatory Visit (INDEPENDENT_AMBULATORY_CARE_PROVIDER_SITE_OTHER): Payer: Medicare Other

## 2019-01-14 ENCOUNTER — Encounter: Payer: Self-pay | Admitting: *Deleted

## 2019-01-14 ENCOUNTER — Other Ambulatory Visit: Payer: Self-pay

## 2019-01-14 DIAGNOSIS — R0602 Shortness of breath: Secondary | ICD-10-CM

## 2019-01-14 DIAGNOSIS — R0989 Other specified symptoms and signs involving the circulatory and respiratory systems: Secondary | ICD-10-CM | POA: Diagnosis not present

## 2019-01-16 ENCOUNTER — Telehealth: Payer: Self-pay | Admitting: Family Medicine

## 2019-01-16 DIAGNOSIS — K769 Liver disease, unspecified: Secondary | ICD-10-CM

## 2019-01-16 NOTE — Telephone Encounter (Signed)
-----   Message from Lieutenant Diego, RN sent at 01/14/2019  9:32 AM EST ----- Christian Hebert will be sent notification of these results. We will contact him and schedule the next annual lung screening scan close to the time it is due.  Thanks, [employer]

## 2019-01-16 NOTE — Telephone Encounter (Signed)
Please let the patient know that the CT scan of his chest picked up on a possible cyst in his liver though it was incompletely evaluated on the CT scan. I would suggest we get a scan with contrast to ensure that this is a cyst vs some other lesion. I can order this if he is ok with it. Thanks.

## 2019-01-18 NOTE — Telephone Encounter (Signed)
Left message on  voicemail to call back.  Nina,cma

## 2019-01-18 NOTE — Telephone Encounter (Signed)
Patient returned a call back and I informed him of his CT scan results and he is willing to do the CT scan with contrast.  Christian Hebert,cma

## 2019-01-19 NOTE — Telephone Encounter (Signed)
Please let the patient know that it looks like we need to start with an ultrasound to evaluate the lesion.  I placed an order for that.  Somebody should contact him to get this scheduled.

## 2019-01-19 NOTE — Addendum Note (Signed)
Addended by: Leone Haven on: 01/19/2019 09:14 AM   Modules accepted: Orders

## 2019-01-19 NOTE — Telephone Encounter (Signed)
Called patient to let him know that the provider stated he would start with a U/S to evaluate, no answer no voicemail.  Christian Hebert,cma

## 2019-01-20 NOTE — Progress Notes (Deleted)
Called patient no answer left message  

## 2019-01-20 NOTE — Telephone Encounter (Signed)
I called and informed the patient that the provider will start his evaluation of his liver by a U/S instead of a Ct scan.  He understood. Divinity Kyler,cma

## 2019-01-21 ENCOUNTER — Inpatient Hospital Stay: Payer: Medicare Other

## 2019-01-21 ENCOUNTER — Inpatient Hospital Stay: Payer: Medicare Other | Admitting: Oncology

## 2019-01-29 ENCOUNTER — Ambulatory Visit: Payer: Medicare Other | Admitting: Family Medicine

## 2019-01-29 ENCOUNTER — Ambulatory Visit: Payer: Medicare Other

## 2019-02-01 ENCOUNTER — Telehealth: Payer: Self-pay | Admitting: Oncology

## 2019-02-01 NOTE — Telephone Encounter (Signed)
Return VM message to patient about appt cancellation. Pt does not want to r\s at this time as he has been sick for over one week. Will call office back when he is feeling better to r\s.

## 2019-02-03 ENCOUNTER — Inpatient Hospital Stay: Payer: Medicare Other | Admitting: Oncology

## 2019-02-03 ENCOUNTER — Inpatient Hospital Stay: Payer: Medicare Other

## 2019-02-05 ENCOUNTER — Ambulatory Visit
Admission: RE | Admit: 2019-02-05 | Discharge: 2019-02-05 | Disposition: A | Discharge status: Home / Self Care | Payer: Medicare Other | Source: Ambulatory Visit | Attending: Family Medicine | Admitting: Family Medicine

## 2019-02-05 ENCOUNTER — Other Ambulatory Visit: Payer: Self-pay

## 2019-02-05 DIAGNOSIS — K769 Liver disease, unspecified: Secondary | ICD-10-CM | POA: Diagnosis not present

## 2019-02-05 DIAGNOSIS — K8020 Calculus of gallbladder without cholecystitis without obstruction: Secondary | ICD-10-CM | POA: Diagnosis not present

## 2019-02-18 ENCOUNTER — Other Ambulatory Visit: Payer: Self-pay | Admitting: Family Medicine

## 2019-02-18 DIAGNOSIS — K824 Cholesterolosis of gallbladder: Secondary | ICD-10-CM

## 2019-03-11 DIAGNOSIS — K824 Cholesterolosis of gallbladder: Secondary | ICD-10-CM | POA: Diagnosis not present

## 2019-08-12 DIAGNOSIS — Z23 Encounter for immunization: Secondary | ICD-10-CM | POA: Diagnosis not present

## 2019-10-04 ENCOUNTER — Other Ambulatory Visit: Payer: Self-pay

## 2019-10-04 ENCOUNTER — Ambulatory Visit (INDEPENDENT_AMBULATORY_CARE_PROVIDER_SITE_OTHER): Payer: Medicare Other | Admitting: Family Medicine

## 2019-10-04 ENCOUNTER — Encounter: Payer: Self-pay | Admitting: Family Medicine

## 2019-10-04 VITALS — BP 130/70 | HR 69 | Temp 97.8°F | Ht 71.0 in | Wt 143.4 lb

## 2019-10-04 DIAGNOSIS — I2510 Atherosclerotic heart disease of native coronary artery without angina pectoris: Secondary | ICD-10-CM

## 2019-10-04 DIAGNOSIS — D72829 Elevated white blood cell count, unspecified: Secondary | ICD-10-CM | POA: Diagnosis not present

## 2019-10-04 DIAGNOSIS — I2584 Coronary atherosclerosis due to calcified coronary lesion: Secondary | ICD-10-CM

## 2019-10-04 DIAGNOSIS — Z1211 Encounter for screening for malignant neoplasm of colon: Secondary | ICD-10-CM

## 2019-10-04 DIAGNOSIS — N489 Disorder of penis, unspecified: Secondary | ICD-10-CM | POA: Diagnosis not present

## 2019-10-04 DIAGNOSIS — E782 Mixed hyperlipidemia: Secondary | ICD-10-CM

## 2019-10-04 DIAGNOSIS — R7303 Prediabetes: Secondary | ICD-10-CM | POA: Diagnosis not present

## 2019-10-04 DIAGNOSIS — J439 Emphysema, unspecified: Secondary | ICD-10-CM | POA: Diagnosis not present

## 2019-10-04 DIAGNOSIS — Z8616 Personal history of COVID-19: Secondary | ICD-10-CM

## 2019-10-04 DIAGNOSIS — I700 Atherosclerosis of aorta: Secondary | ICD-10-CM | POA: Diagnosis not present

## 2019-10-04 LAB — LIPID PANEL
Cholesterol: 136 mg/dL (ref 0–200)
HDL: 29.1 mg/dL — ABNORMAL LOW (ref >39.00)
LDL Cholesterol: 82 mg/dL (ref 0–99)
NonHDL: 106.78
Total CHOL/HDL Ratio: 5
Triglycerides: 126 mg/dL (ref 0.0–149.0)
VLDL: 25.2 mg/dL (ref 0.0–40.0)

## 2019-10-04 LAB — CBC WITH DIFFERENTIAL/PLATELET
Basophils Absolute: 0.1 10*3/uL (ref 0.0–0.1)
Basophils Relative: 0.7 % (ref 0.0–3.0)
Eosinophils Absolute: 0.1 10*3/uL (ref 0.0–0.7)
Eosinophils Relative: 0.6 % (ref 0.0–5.0)
HCT: 43.6 % (ref 39.0–52.0)
Hemoglobin: 14.9 g/dL (ref 13.0–17.0)
Lymphocytes Relative: 11.4 % — ABNORMAL LOW (ref 12.0–46.0)
Lymphs Abs: 1.7 10*3/uL (ref 0.7–4.0)
MCHC: 34.1 g/dL (ref 30.0–36.0)
MCV: 91.7 fl (ref 78.0–100.0)
Monocytes Absolute: 1.6 10*3/uL — ABNORMAL HIGH (ref 0.1–1.0)
Monocytes Relative: 11.1 % (ref 3.0–12.0)
Neutro Abs: 11.2 10*3/uL — ABNORMAL HIGH (ref 1.4–7.7)
Neutrophils Relative %: 76.2 % (ref 43.0–77.0)
Platelets: 356 10*3/uL (ref 150.0–400.0)
RBC: 4.76 Mil/uL (ref 4.22–5.81)
RDW: 13.5 % (ref 11.5–15.5)
WBC: 14.8 10*3/uL — ABNORMAL HIGH (ref 4.0–10.5)

## 2019-10-04 LAB — COMPREHENSIVE METABOLIC PANEL
ALT: 11 U/L (ref 0–53)
AST: 13 U/L (ref 0–37)
Albumin: 4 g/dL (ref 3.5–5.2)
Alkaline Phosphatase: 67 U/L (ref 39–117)
BUN: 15 mg/dL (ref 6–23)
CO2: 25 mEq/L (ref 19–32)
Calcium: 9.4 mg/dL (ref 8.4–10.5)
Chloride: 103 mEq/L (ref 96–112)
Creatinine, Ser: 1.24 mg/dL (ref 0.40–1.50)
GFR: 56.87 mL/min — ABNORMAL LOW (ref >60.00)
Glucose, Bld: 87 mg/dL (ref 70–99)
Potassium: 4.4 mEq/L (ref 3.5–5.1)
Sodium: 137 mEq/L (ref 135–145)
Total Bilirubin: 0.5 mg/dL (ref 0.2–1.2)
Total Protein: 7.1 g/dL (ref 6.0–8.3)

## 2019-10-04 LAB — SARS-COV-2 IGG: SARS-COV-2 IgG: 39.18

## 2019-10-04 LAB — HEMOGLOBIN A1C: Hgb A1c MFr Bld: 6.2 % (ref 4.6–6.5)

## 2019-10-04 NOTE — Assessment & Plan Note (Signed)
Presumptively had Covid in January based on symptoms and exposure.  We will check an antibody test today.  I encouraged him to get his second Covid vaccine.  I did discuss he would likely feel poorly after it given his reaction to the first vaccine.

## 2019-10-04 NOTE — Assessment & Plan Note (Signed)
Check CBC with differential. 

## 2019-10-04 NOTE — Assessment & Plan Note (Signed)
No chest pain.  Check lipid panel.

## 2019-10-04 NOTE — Patient Instructions (Signed)
Nice to see you. We will get lab work today and contact you with the results. Please monitor your breathing and if it worsens please let us know. Please complete the Cologuard when you receive it in the mail.

## 2019-10-04 NOTE — Assessment & Plan Note (Signed)
Checking labs for risk factor management.

## 2019-10-04 NOTE — Assessment & Plan Note (Signed)
Check lipid panel  

## 2019-10-04 NOTE — Assessment & Plan Note (Signed)
Check A1c. 

## 2019-10-04 NOTE — Assessment & Plan Note (Signed)
Patient saw urology.  They felt these lesions were benign.  No follow-up needed per their note.

## 2019-10-04 NOTE — Assessment & Plan Note (Signed)
Suspect his mild dyspnea is related to his COPD.  I encouraged him to quit smoking.  Offered an inhaler though he declined.  He will monitor.

## 2019-10-04 NOTE — Progress Notes (Signed)
Christian Rumps, MD Phone: [phone]  Christian Hebert is a 74 y.o. male who presents today for f/u.  COPD: Medication compliance- not on medications   Dyspnea- chronic and stable, still able to stay active and golf  Wheezing- no  Cough- no  Declines medication treatment for this.  Continues to smoke the occasional cigar  COVID-19 vaccine: Patient notes he did get the first vaccine and subsequently felt poorly.  He had no anaphylactic symptoms.  He does report he had likely Covid in January.  He was around somebody who tested positive and the patient lost his sense of taste and had cough and congestion.  He did not get tested.  He wonders about antibody testing.  CAD: Denies chest pain.  Does follow with cardiology.  He had a reassuring echo earlier this year for his dyspnea.  Leukocytosis: Due for recheck of CBC.  Social History   Tobacco Use  Smoking Status Former Smoker  . Packs/day: 1.00  . Years: 50.00  . Pack years: 50.00  . Types: Cigarettes  . Quit date: 01/08/2016  . Years since quitting: 3.7  Smokeless Tobacco Never Used     ROS see history of present illness  Objective  Physical Exam Vitals:   10/04/19 1123  BP: 130/70  Pulse: 69  Temp: 97.8 F (36.6 C)  SpO2: 95%    BP Readings from Last 3 Encounters:  10/04/19 130/70  12/08/18 130/82  01/27/18 130/70   Wt Readings from Last 3 Encounters:  10/04/19 143 lb 6.4 oz (65 kg)  01/12/19 147 lb (66.7 kg)  12/08/18 147 lb 4 oz (66.8 kg)    Physical Exam Constitutional:      General: He is not in acute distress.    Appearance: He is not diaphoretic.  Cardiovascular:     Rate and Rhythm: Normal rate and regular rhythm.     Heart sounds: Normal heart sounds.  Pulmonary:     Effort: Pulmonary effort is normal.     Breath sounds: Normal breath sounds.  Abdominal:     General: Bowel sounds are normal. There is no distension.     Palpations: Abdomen is soft.     Tenderness: There is no abdominal  tenderness. There is no guarding or rebound.  Musculoskeletal:     Right lower leg: No edema.     Left lower leg: No edema.  Skin:    General: Skin is warm and dry.  Neurological:     Mental Status: He is alert.      Assessment/Plan: Please see individual problem list.  Coronary artery calcification No chest pain.  Check lipid panel.  COPD (chronic obstructive pulmonary disease) ([place]) Suspect his mild dyspnea is related to his COPD.  I encouraged him to quit smoking.  Offered an inhaler though he declined.  He will monitor.  Hyperlipidemia Check lipid panel.  Leukocytosis Check CBC with differential.  Penile lesion Patient saw urology.  They felt these lesions were benign.  No follow-up needed per their note.  Prediabetes Check A1c.  History of COVID-19 Presumptively had Covid in January based on symptoms and exposure.  We will check an antibody test today.  I encouraged him to get his second Covid vaccine.  I did discuss he would likely feel poorly after it given his reaction to the first vaccine.  Aortic atherosclerosis ([place]) Checking labs for risk factor management.   Health Maintenance: Cologuard ordered.  Orders Placed This Encounter  Procedures  . SARS-COV-2 IgG  .  HgB A1c  . Comp Met (CMET)  . Lipid panel  . CBC w/Diff  . Cologuard    No orders of the defined types were placed in this encounter.   Christian Hebert was seen today for follow-up.  Diagnoses and all orders for this visit:  Colon cancer screening -     Cologuard  Coronary artery calcification -     Comp Met (CMET) -     Lipid panel  Pulmonary emphysema, unspecified emphysema type (HCC)  Mixed hyperlipidemia -     Comp Met (CMET) -     Lipid panel  Leukocytosis, unspecified type -     CBC w/Diff  Penile lesion  Prediabetes -     HgB A1c  History of COVID-19 -     SARS-COV-2 IgG  Aortic atherosclerosis ([place])     This visit occurred during the SARS-CoV-2 public health  emergency.  Safety protocols were in place, including screening questions prior to the visit, additional usage of staff PPE, and extensive cleaning of exam room while observing appropriate contact time as indicated for disinfecting solutions.    Christian Rumps, MD [place]

## 2019-10-19 ENCOUNTER — Other Ambulatory Visit: Payer: Self-pay | Admitting: Family Medicine

## 2019-11-04 ENCOUNTER — Telehealth: Payer: Self-pay

## 2019-11-04 ENCOUNTER — Other Ambulatory Visit: Payer: Self-pay | Admitting: Family Medicine

## 2019-11-04 DIAGNOSIS — D72829 Elevated white blood cell count, unspecified: Secondary | ICD-10-CM

## 2019-11-04 NOTE — Telephone Encounter (Signed)
-----   Message from Leone Haven, MD sent at 10/19/2019 10:41 PM EDT ----- Noted. Given the repeated elevation of his WBC with out any significant findings of infection I would suggest that he see hematology for further evaluation. I can place a referral once you speak with him. Thanks.

## 2019-11-11 ENCOUNTER — Inpatient Hospital Stay: Payer: Medicare Other | Attending: Oncology | Admitting: Oncology

## 2019-11-11 ENCOUNTER — Encounter: Payer: Self-pay | Admitting: Oncology

## 2019-11-11 ENCOUNTER — Inpatient Hospital Stay: Payer: Medicare Other

## 2019-11-11 ENCOUNTER — Other Ambulatory Visit: Payer: Self-pay | Admitting: Oncology

## 2019-11-11 VITALS — BP 131/75 | HR 65 | Temp 96.9°F | Resp 18 | Wt 143.1 lb

## 2019-11-11 DIAGNOSIS — D72820 Lymphocytosis (symptomatic): Secondary | ICD-10-CM | POA: Diagnosis not present

## 2019-11-11 DIAGNOSIS — N1831 Chronic kidney disease, stage 3a: Secondary | ICD-10-CM

## 2019-11-11 DIAGNOSIS — Z87891 Personal history of nicotine dependence: Secondary | ICD-10-CM

## 2019-11-11 DIAGNOSIS — D72821 Monocytosis (symptomatic): Secondary | ICD-10-CM

## 2019-11-11 DIAGNOSIS — D72829 Elevated white blood cell count, unspecified: Secondary | ICD-10-CM

## 2019-11-11 LAB — CBC WITH DIFFERENTIAL/PLATELET
Abs Immature Granulocytes: 0.03 10*3/uL (ref 0.00–0.07)
Basophils Absolute: 0.1 10*3/uL (ref 0.0–0.1)
Basophils Relative: 1 %
Eosinophils Absolute: 0.1 10*3/uL (ref 0.0–0.5)
Eosinophils Relative: 1 %
HCT: 44.7 % (ref 39.0–52.0)
Hemoglobin: 15.1 g/dL (ref 13.0–17.0)
Immature Granulocytes: 0 %
Lymphocytes Relative: 13 %
Lymphs Abs: 1.3 10*3/uL (ref 0.7–4.0)
MCH: 30.8 pg (ref 26.0–34.0)
MCHC: 33.8 g/dL (ref 30.0–36.0)
MCV: 91.2 fL (ref 80.0–100.0)
Monocytes Absolute: 1.2 10*3/uL — ABNORMAL HIGH (ref 0.1–1.0)
Monocytes Relative: 12 %
Neutro Abs: 7.5 10*3/uL (ref 1.7–7.7)
Neutrophils Relative %: 73 %
Platelets: 336 10*3/uL (ref 150–400)
RBC: 4.9 MIL/uL (ref 4.22–5.81)
RDW: 13.1 % (ref 11.5–15.5)
WBC: 10.2 10*3/uL (ref 4.0–10.5)
nRBC: 0 % (ref 0.0–0.2)

## 2019-11-11 LAB — COMPREHENSIVE METABOLIC PANEL
ALT: 14 U/L (ref 0–44)
AST: 14 U/L — ABNORMAL LOW (ref 15–41)
Albumin: 3.9 g/dL (ref 3.5–5.0)
Alkaline Phosphatase: 59 U/L (ref 38–126)
Anion gap: 7 (ref 5–15)
BUN: 13 mg/dL (ref 8–23)
CO2: 25 mmol/L (ref 22–32)
Calcium: 9.2 mg/dL (ref 8.9–10.3)
Chloride: 105 mmol/L (ref 98–111)
Creatinine, Ser: 1.27 mg/dL — ABNORMAL HIGH (ref 0.61–1.24)
GFR, Estimated: 59 mL/min — ABNORMAL LOW (ref >60)
Glucose, Bld: 97 mg/dL (ref 70–99)
Potassium: 4.6 mmol/L (ref 3.5–5.1)
Sodium: 137 mmol/L (ref 135–145)
Total Bilirubin: 0.8 mg/dL (ref 0.3–1.2)
Total Protein: 7.4 g/dL (ref 6.5–8.1)

## 2019-11-11 LAB — LACTATE DEHYDROGENASE: LDH: 108 U/L (ref 98–192)

## 2019-11-11 NOTE — Progress Notes (Signed)
Hematology/Oncology follow up note [facility] [phone] [phone]   Patient Care Team: Sonnenberg, Eric G, MD as PCP - General (Family Medicine)  REFERRING PROVIDER: Sonnenberg, Eric G, MD [place] CHIEF COMPLAINTS/REASON FOR VISIT:  Evaluation of leukocytosis  HISTORY OF PRESENTING ILLNESS:  Christian Hebert is a  74 y.o.  male with PMH listed below who was referred to me for evaluation of leukocytosis Reviewed patient' recent labs on 09/16/2017 obtained by PCP.  CBC showed elevated white count of 11.8, predominately monocytosis and neutrophilia. Previous lab records reviewed. Leukocytosis/monocytosis/neutrophilia onset of chronic, duration is since at least 2017  No aggravating or elevated factors Associated symptoms or signs: Reports unintentional weight loss since hernia surgery last year.  Not having a good appetite.  Does not eat much. No fever or chills, night sweating.  Smoking history: 50-pack-year of smoking, although stopped smoking in March 2018.  Patient admits smoking cigar occasionally.  History of recent oral steroid use or steroid injection: Denies History of recent infection: He had a history of diverticulitis.  CT 07/17/2017 was independently reviewed.  Showed mild distal descending and sigmoid diverticulitis without complicating features.  Reports having pain during the diverticulitis flare and he took a course of antibiotics, pain resolved.  Last dose of antibiotics was about 3 weeks ago.   INTERVAL HISTORY Christian Hebert is a 74 y.o. male who has above history reviewed by me today presents for follow up visit for management of leukocytosis.  Patient reports feeling well.  Chronic fatigue.  He states that he has not lost much weight but has not been able to gain any weight despite eating very well.  Patient lost follow-up and was referred back to us to establish care. Denies any night sweats fever Per epic record,  he seems to have lost 4 pounds over the past 1 year  Review of Systems  Constitutional: Positive for weight loss. Negative for chills, fever and malaise/fatigue.  HENT: Negative for nosebleeds and sore throat.   Eyes: Negative for double vision, photophobia and redness.  Respiratory: Negative for cough, shortness of breath and wheezing.   Cardiovascular: Negative for chest pain, palpitations and orthopnea.  Gastrointestinal: Negative for abdominal pain, blood in stool, nausea and vomiting.  Genitourinary: Negative for dysuria.  Musculoskeletal: Negative for back pain, myalgias and neck pain.  Skin: Negative for itching and rash.  Neurological: Negative for dizziness, tingling and tremors.  Endo/Heme/Allergies: Negative for environmental allergies. Does not bruise/bleed easily.  Psychiatric/Behavioral: Negative for depression.    MEDICAL HISTORY:  Past Medical History:  Diagnosis Date  . COPD (chronic obstructive pulmonary disease) (HCC)   . Coronary atherosclerosis    a. Treadmill Myoview 12/2015: Exercised for 8 minutes unable to achieve target heart rate, Lexiscan without significant ischemia, EF 55-65%  . Diverticulitis   . GERD (gastroesophageal reflux disease)   . Inguinal hernia   . Leukocytosis   . Personal history of tobacco use, presenting hazards to health 07/20/2015    SURGICAL HISTORY: Past Surgical History:  Procedure Laterality Date  . HERNIA REPAIR     bilateral inguinal   . INGUINAL HERNIA REPAIR Bilateral 09/12/2016   Procedure: LAPAROSCOPIC BILATERAL INGUINAL HERNIA REPAIR;  Surgeon: Cooper, Richard E, MD;  Location: [facility] ORS;  Service: General;  Laterality: Bilateral;  . TONSILLECTOMY      SOCIAL HISTORY: Social History   Socioeconomic History  . Marital status: Married    Spouse name: Not on file  . Number of children:   Not on file  . Years of education: Not on file  . Highest education level: Not on file  Occupational History  . Not on file  Tobacco  Use  . Smoking status: Former Smoker    Packs/day: 1.00    Years: 50.00    Pack years: 50.00    Types: Cigarettes    Quit date: 01/08/2016    Years since quitting: 3.8  . Smokeless tobacco: Never Used  Vaping Use  . Vaping Use: Never used  Substance and Sexual Activity  . Alcohol use: No    Alcohol/week: 0.0 standard drinks  . Drug use: No  . Sexual activity: Not Currently  Other Topics Concern  . Not on file  Social History Narrative  . Not on file   Social Determinants of Health   Financial Resource Strain:   . Difficulty of Paying Living Expenses: Not on file  Food Insecurity:   . Worried About Charity fundraiser in the Last Year: Not on file  . Ran Out of Food in the Last Year: Not on file  Transportation Needs:   . Lack of Transportation (Medical): Not on file  . Lack of Transportation (Non-Medical): Not on file  Physical Activity:   . Days of Exercise per Week: Not on file  . Minutes of Exercise per Session: Not on file  Stress:   . Feeling of Stress : Not on file  Social Connections:   . Frequency of Communication with Friends and Family: Not on file  . Frequency of Social Gatherings with Friends and Family: Not on file  . Attends Religious Services: Not on file  . Active Member of Clubs or Organizations: Not on file  . Attends Archivist Meetings: Not on file  . Marital Status: Not on file  Intimate Partner Violence:   . Fear of Current or Ex-Partner: Not on file  . Emotionally Abused: Not on file  . Physically Abused: Not on file  . Sexually Abused: Not on file    FAMILY HISTORY: Family History  Problem Relation Age of Onset  . Stroke Mother   . Hypertension Mother   . Heart disease Father   . Hypertension Father   . Lung cancer Father   . Heart disease Sister   . Cancer Neg Hx     ALLERGIES:  is allergic to oxycodone.  MEDICATIONS:  Current Outpatient Medications  Medication Sig Dispense Refill  . aspirin 81 MG chewable tablet Chew  by mouth daily.    Marland Kitchen b complex vitamins capsule Take 1 capsule by mouth daily.    . Multiple Vitamins-Minerals (MULTIVITAMIN WITH MINERALS) tablet Take 1 tablet by mouth daily.    . vitamin C (ASCORBIC ACID) 250 MG tablet Take 250 mg by mouth daily.    Marland Kitchen VITAMIN D PO Take by mouth.     No current facility-administered medications for this visit.     PHYSICAL EXAMINATION: ECOG PERFORMANCE STATUS: 1 - Symptomatic but completely ambulatory Vitals:   11/11/19 0908  BP: 131/75  Pulse: 65  Resp: 18  Temp: (!) 96.9 F (36.1 C)   Filed Weights   11/11/19 0908  Weight: 143 lb 1.6 oz (64.9 kg)    Physical Exam Constitutional:      General: He is not in acute distress. HENT:     Head: Normocephalic and atraumatic.  Eyes:     General: No scleral icterus.    Pupils: Pupils are equal, round, and reactive to light.  Cardiovascular:  Rate and Rhythm: Normal rate and regular rhythm.     Heart sounds: Normal heart sounds.  Pulmonary:     Effort: Pulmonary effort is normal. No respiratory distress.     Breath sounds: No wheezing.  Abdominal:     General: Bowel sounds are normal. There is no distension.     Palpations: Abdomen is soft. There is no mass.     Tenderness: There is no abdominal tenderness.  Musculoskeletal:        General: No deformity. Normal range of motion.     Cervical back: Normal range of motion and neck supple.  Skin:    General: Skin is warm and dry.     Findings: No erythema or rash.  Neurological:     Mental Status: He is alert and oriented to person, place, and time.     Cranial Nerves: No cranial nerve deficit.     Coordination: Coordination normal.  Psychiatric:        Behavior: Behavior normal.        Thought Content: Thought content normal.     CMP Latest Ref Rng & Units 11/11/2019  Glucose 70 - 99 mg/dL 97  BUN 8 - 23 mg/dL 13  Creatinine 0.61 - 1.24 mg/dL 1.27(H)  Sodium 135 - 145 mmol/L 137  Potassium 3.5 - 5.1 mmol/L 4.6  Chloride 98 -  111 mmol/L 105  CO2 22 - 32 mmol/L 25  Calcium 8.9 - 10.3 mg/dL 9.2  Total Protein 6.5 - 8.1 g/dL 7.4  Total Bilirubin 0.3 - 1.2 mg/dL 0.8  Alkaline Phos 38 - 126 U/L 59  AST 15 - 41 U/L 14(L)  ALT 0 - 44 U/L 14   CBC Latest Ref Rng & Units 11/11/2019  WBC 4.0 - 10.5 K/uL 10.2  Hemoglobin 13.0 - 17.0 g/dL 15.1  Hematocrit 39 - 52 % 44.7  Platelets 150 - 400 K/uL 336    LABORATORY DATA:  I have reviewed the data as listed Lab Results  Component Value Date   WBC 10.2 11/11/2019   HGB 15.1 11/11/2019   HCT 44.7 11/11/2019   MCV 91.2 11/11/2019   PLT 336 11/11/2019   Recent Labs    12/24/18 1059 10/04/19 1200 11/11/19 0953  NA 138 137 137  K 4.3 4.4 4.6  CL 106 103 105  CO2 25 25 25  GLUCOSE 103* 87 97  BUN 14 15 13  CREATININE 1.29* 1.24 1.27*  CALCIUM 8.9 9.4 9.2  GFRNONAA 55*  --  59*  GFRAA >60  --   --   PROT 6.8 7.1 7.4  ALBUMIN 3.6 4.0 3.9  AST 14* 13 14*  ALT 13 11 14  ALKPHOS 63 67 59  BILITOT 0.6 0.5 0.8   Iron/TIBC/Ferritin/ %Sat No results found for: IRON, TIBC, FERRITIN, IRONPCTSAT    07/17/2017 CT renal stone study.  1. Mild distal descending and sigmoid diverticulitis without complicating features. 2. Centrilobular emphysema at the lung bases. 3. Stable hepatic and right renal cysts. 4. Mild prostatomegaly. 5. Bilateral herniorrhaphies without recurrent hernia  ASSESSMENT & PLAN:  1. Monoclonal B-cell lymphocytosis of undetermined significance   2. Stage 3a chronic kidney disease (HCC)   3. Personal history of tobacco use, presenting hazards to health   4. Monocytosis    monoclonal B cell lymphocytosis.  Labs are reviewed and discussed with patient. Chronic leukocytosis, fluctuating. Check CBC, CMP, LDH, flow cytometry. No significant constitutional symptoms except mild weight loss.  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   Check EBV antibody panel Discussed about bone marrow biopsy with him in the past and patient wants to defer.  Today we discussed bone marrow biopsy again which patient prefers to defer until after  he put everything quitting smoking.  Given that the monocytosis has been stable, will hold off bone marrow biopsy at this point  Smoke cessation was discussed with patient.  Offered him to be referred to smoke cessation clinic and he declined. He has been followed by a lung cancer screening program.  #CKD, avoid nephrotoxin. Orders Placed This Encounter  Procedures  . EBV ab to viral capsid ag pnl, IgG+IgM    Standing Status:   Future    Number of Occurrences:   1    Standing Expiration Date:   11/10/2020    All questions were answered. The patient knows to call the clinic with any problems questions or concerns.  Return of visit: 3 months.    , MD, PhD Hematology Oncology [facility] Pager- [phone] 11/11/2019 = 

## 2019-11-12 LAB — EBV AB TO VIRAL CAPSID AG PNL, IGG+IGM
EBV VCA IgG: 102 U/mL — ABNORMAL HIGH (ref 0.0–17.9)
EBV VCA IgM: <36 U/mL (ref 0.0–35.9)

## 2019-11-16 LAB — COMP PANEL: LEUKEMIA/LYMPHOMA: Immunophenotypic Profile: 1

## 2019-12-29 ENCOUNTER — Encounter: Payer: Self-pay | Admitting: Cardiovascular Disease

## 2019-12-29 ENCOUNTER — Ambulatory Visit (INDEPENDENT_AMBULATORY_CARE_PROVIDER_SITE_OTHER): Payer: Medicare Other | Admitting: Cardiovascular Disease

## 2019-12-29 ENCOUNTER — Other Ambulatory Visit: Payer: Self-pay

## 2019-12-29 VITALS — BP 130/70 | HR 70 | Ht 71.0 in | Wt 141.5 lb

## 2019-12-29 DIAGNOSIS — I2584 Coronary atherosclerosis due to calcified coronary lesion: Secondary | ICD-10-CM | POA: Diagnosis not present

## 2019-12-29 DIAGNOSIS — I2510 Atherosclerotic heart disease of native coronary artery without angina pectoris: Secondary | ICD-10-CM | POA: Diagnosis not present

## 2019-12-29 NOTE — Progress Notes (Signed)
Cardiology Office Note   Date:  12/29/2019   ID:  [name], [name] 1945-07-27, MRN [id number]  PCP:  Leone Haven, MD  Cardiologist:   Kathlyn Sacramento, MD   Chief Complaint  Patient presents with  . 12 month f/u. Meds reviewed verbally with pt.    No complaints today      History of Present Illness: Christian Hebert is a 74 y.o. male who is here today for a follow-up visit regarding coronary atherosclerosis and asymptomatic bradycardia. He has known history of COPD and tobacco use.  He is known to have aortic and coronary calcifications in the RCA and LAD distribution noted on previous CT scan of the lungs.  Abdominal aortic ultrasound showed no evidence of aortic aneurysm.   He has no hypertension or diabetes. He underwent a treadmill Myoview in July 2018 which was normal. The patient has known history of mild asymptomatic bradycardia.  He continues to smoke half a pack per day.    He had an echocardiogram done in January 2021 which showed an EF of 50 to [id number], grade 1 diastolic dysfunction, mildly elevated pulmonary pressure. Due to carotid bruit, he had carotid Doppler done in January 2021 which showed mild nonobstructive disease bilaterally.  He has been doing reasonably well with no chest pain or worsening dyspnea.  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  Past Medical History:  Diagnosis Date  . COPD (chronic obstructive pulmonary disease) ([place])   . Coronary atherosclerosis    a. Treadmill Myoview 12/2015: Exercised for 8 minutes unable to achieve target heart rate, Lexiscan without significant ischemia, EF 55-65%  . Diverticulitis   . GERD (gastroesophageal reflux disease)   . Inguinal hernia   . Leukocytosis   . Personal history of tobacco use, presenting hazards to health 07/20/2015    Past Surgical History:  Procedure Laterality Date  . HERNIA REPAIR     bilateral inguinal   . INGUINAL HERNIA REPAIR Bilateral 09/12/2016    Procedure: LAPAROSCOPIC BILATERAL INGUINAL HERNIA REPAIR;  Surgeon: Florene Glen, MD;  Location: [facility] ORS;  Service: General;  Laterality: Bilateral;  . TONSILLECTOMY       Current Outpatient Medications  Medication Sig Dispense Refill  . aspirin 81 MG chewable tablet Chew by mouth daily.    Marland Kitchen b complex vitamins capsule Take 1 capsule by mouth daily.    . Multiple Vitamins-Minerals (MULTIVITAMIN WITH MINERALS) tablet Take 1 tablet by mouth daily.    . vitamin C (ASCORBIC ACID) 250 MG tablet Take 250 mg by mouth daily.    Marland Kitchen VITAMIN D PO Take by mouth.     No current facility-administered medications for this visit.    Allergies:   Oxycodone    Social History:  The patient  reports that he quit smoking about 3 years ago. His smoking use included cigarettes. He has a 50.00 pack-year smoking history. He has never used smokeless tobacco. He reports that he does not drink alcohol and does not use drugs.   Family History:  The patient's family history includes Heart disease in his father and sister; Hypertension in his father and mother; Lung cancer in his father; Stroke in his mother.    ROS:  Please see the history of present illness.   Otherwise, review of systems are positive for none.   All other systems are reviewed and negative.    PHYSICAL EXAM: VS:  BP 130/70 (BP Location: Left Arm, Patient Position: Sitting,  Cuff Size: Normal)   Pulse 70   Ht 5\' 11"  (1.803 m)   Wt 141 lb 8 oz (64.2 kg)   SpO2 97%   BMI 19.74 kg/m  , BMI Body mass index is 19.74 kg/m. GEN: Well nourished, well developed, in no acute distress  HEENT: normal  Neck: no JVD, , or masses.  Faint left carotid bruit Cardiac: RRR; no murmurs, rubs, or gallops,no edema  Respiratory:  clear to auscultation bilaterally, normal work of breathing GI: soft, nontender, nondistended, + BS MS: no deformity or atrophy  Skin: warm and dry, no rash Neuro:  Strength and sensation are intact Psych: euthymic mood,  full affect Distal pulses are normal.  EKG:  EKG is  ordered today. EKG showed normal sinus rhythm with no significant ST or T wave changes.   Recent Labs: 11/11/2019: ALT 14; BUN 13; Creatinine, Ser 1.27; Hemoglobin 15.1; Platelets 336; Potassium 4.6; Sodium 137    Lipid Panel    Component Value Date/Time   CHOL 136 10/04/2019 1200   TRIG 126.0 10/04/2019 1200   HDL 29.10 (L) 10/04/2019 1200   CHOLHDL 5 10/04/2019 1200   VLDL 25.2 10/04/2019 1200   LDLCALC 82 10/04/2019 1200   LDLDIRECT 106.0 04/23/2017 1343      Wt Readings from Last 3 Encounters:  12/29/19 141 lb 8 oz (64.2 kg)  11/11/19 143 lb 1.6 oz (64.9 kg)  10/04/19 143 lb 6.4 oz (65 kg)       PAD Screen 12/12/2015  Previous PAD dx? No  Previous surgical procedure? No  Pain with walking? No  Feet/toe relief with dangling? No  Painful, non-healing ulcers? No  Extremities discolored? No      ASSESSMENT AND PLAN:  1.  Coronary atherosclerosis: This was noted on CT scan of the lungs.  Negative nuclear stress test was negative in 2018.  Continue low-dose aspirin.    2. Borderline hyperlipidemia: Most recent lipid profile showed an LDL of 82.  3.  Tobacco use I again discussed with him the importance of smoking cessation.      Disposition:   FU with me in 12 months.   Signed,  Kathlyn Sacramento, MD  12/29/2019 3:05 PM    [facility]

## 2019-12-29 NOTE — Patient Instructions (Signed)
Medication Instructions:  Your physician recommends that you continue on your current medications as directed. Please refer to the Current Medication list given to you today.  *If you need a refill on your cardiac medications before your next appointment, please call your pharmacy*   Lab Work: None ordered If you have labs (blood work) drawn today and your tests are completely normal, you will receive your results only by: Marland Kitchen MyChart Message (if you have MyChart) OR . A paper copy in the mail If you have any lab test that is abnormal or we need to change your treatment, we will call you to review the results.   Testing/Procedures: None ordered   Follow-Up: At [facility], you and your health needs are our priority.  As part of our continuing mission to provide you with exceptional heart care, we have created designated Provider Care Teams.  These Care Teams include your primary Cardiologist (physician) and Advanced Practice Providers (APPs -  Physician Assistants and Nurse Practitioners) who all work together to provide you with the care you need, when you need it.  We recommend signing up for the patient portal called "MyChart".  Sign up information is provided on this After Visit Summary.  MyChart is used to connect with patients for Virtual Visits (Telemedicine).  Patients are able to view lab/test results, encounter notes, upcoming appointments, etc.  Non-urgent messages can be sent to your provider as well.   To learn more about what you can do with MyChart, go to [email or web address].    Your next appointment:   Your physician wants you to follow-up in: 12 months You will receive a reminder letter in the mail two months in advance. If you don't receive a letter, please call our office to schedule the follow-up appointment.   The format for your next appointment:   In Person  Provider:   You may see Dr. Fletcher Anon or one of the following Advanced Practice Providers on your  designated Care Team:    Murray Hodgkins, NP  Christell Faith, PA-C  Marrianne Mood, PA-C  Cadence Alger, [place]  Laurann Montana, NP    Other Instructions N/A

## 2020-01-05 ENCOUNTER — Other Ambulatory Visit: Payer: Self-pay | Admitting: *Deleted

## 2020-01-05 DIAGNOSIS — Z87891 Personal history of nicotine dependence: Secondary | ICD-10-CM

## 2020-01-05 DIAGNOSIS — Z122 Encounter for screening for malignant neoplasm of respiratory organs: Secondary | ICD-10-CM

## 2020-01-05 NOTE — Progress Notes (Signed)
Contacted and scheduled for annual lung screening scan, patient is a current smoker, 53.75 pack year history.

## 2020-01-13 ENCOUNTER — Ambulatory Visit
Admission: RE | Admit: 2020-01-13 | Discharge: 2020-01-13 | Disposition: A | Discharge status: Home / Self Care | Payer: Medicare Other | Source: Ambulatory Visit | Attending: Nurse Practitioner | Admitting: Nurse Practitioner

## 2020-01-13 ENCOUNTER — Other Ambulatory Visit: Payer: Self-pay

## 2020-01-13 DIAGNOSIS — Z122 Encounter for screening for malignant neoplasm of respiratory organs: Secondary | ICD-10-CM

## 2020-01-13 DIAGNOSIS — Z87891 Personal history of nicotine dependence: Secondary | ICD-10-CM | POA: Diagnosis not present

## 2020-01-14 ENCOUNTER — Encounter: Payer: Self-pay | Admitting: *Deleted

## 2020-02-14 ENCOUNTER — Inpatient Hospital Stay: Payer: Medicare Other | Attending: Oncology

## 2020-02-14 ENCOUNTER — Encounter: Payer: Self-pay | Admitting: Oncology

## 2020-02-14 ENCOUNTER — Other Ambulatory Visit: Payer: Self-pay

## 2020-02-14 ENCOUNTER — Inpatient Hospital Stay (HOSPITAL_BASED_OUTPATIENT_CLINIC_OR_DEPARTMENT_OTHER): Payer: Medicare Other | Admitting: Oncology

## 2020-02-14 VITALS — BP 116/55 | HR 62 | Temp 97.2°F | Resp 16 | Wt 142.2 lb

## 2020-02-14 DIAGNOSIS — Z87891 Personal history of nicotine dependence: Secondary | ICD-10-CM | POA: Diagnosis not present

## 2020-02-14 DIAGNOSIS — D72820 Lymphocytosis (symptomatic): Secondary | ICD-10-CM | POA: Insufficient documentation

## 2020-02-14 DIAGNOSIS — I2510 Atherosclerotic heart disease of native coronary artery without angina pectoris: Secondary | ICD-10-CM | POA: Diagnosis not present

## 2020-02-14 DIAGNOSIS — R5382 Chronic fatigue, unspecified: Secondary | ICD-10-CM | POA: Insufficient documentation

## 2020-02-14 DIAGNOSIS — N189 Chronic kidney disease, unspecified: Secondary | ICD-10-CM | POA: Insufficient documentation

## 2020-02-14 DIAGNOSIS — D72821 Monocytosis (symptomatic): Secondary | ICD-10-CM | POA: Insufficient documentation

## 2020-02-14 DIAGNOSIS — Z122 Encounter for screening for malignant neoplasm of respiratory organs: Secondary | ICD-10-CM

## 2020-02-14 LAB — CBC WITH DIFFERENTIAL/PLATELET
Abs Immature Granulocytes: 0.05 10*3/uL (ref 0.00–0.07)
Basophils Absolute: 0.1 10*3/uL (ref 0.0–0.1)
Basophils Relative: 1 %
Eosinophils Absolute: 0.1 10*3/uL (ref 0.0–0.5)
Eosinophils Relative: 1 %
HCT: 43.2 % (ref 39.0–52.0)
Hemoglobin: 14.7 g/dL (ref 13.0–17.0)
Immature Granulocytes: 1 %
Lymphocytes Relative: 13 %
Lymphs Abs: 1.4 10*3/uL (ref 0.7–4.0)
MCH: 30.9 pg (ref 26.0–34.0)
MCHC: 34 g/dL (ref 30.0–36.0)
MCV: 90.8 fL (ref 80.0–100.0)
Monocytes Absolute: 1.3 10*3/uL — ABNORMAL HIGH (ref 0.1–1.0)
Monocytes Relative: 12 %
Neutro Abs: 7.7 10*3/uL (ref 1.7–7.7)
Neutrophils Relative %: 72 %
Platelets: 327 10*3/uL (ref 150–400)
RBC: 4.76 MIL/uL (ref 4.22–5.81)
RDW: 12.9 % (ref 11.5–15.5)
WBC: 10.6 10*3/uL — ABNORMAL HIGH (ref 4.0–10.5)
nRBC: 0 % (ref 0.0–0.2)

## 2020-02-14 LAB — COMPREHENSIVE METABOLIC PANEL
ALT: 12 U/L (ref 0–44)
AST: 13 U/L — ABNORMAL LOW (ref 15–41)
Albumin: 3.5 g/dL (ref 3.5–5.0)
Alkaline Phosphatase: 63 U/L (ref 38–126)
Anion gap: 7 (ref 5–15)
BUN: 14 mg/dL (ref 8–23)
CO2: 26 mmol/L (ref 22–32)
Calcium: 8.9 mg/dL (ref 8.9–10.3)
Chloride: 105 mmol/L (ref 98–111)
Creatinine, Ser: 1.45 mg/dL — ABNORMAL HIGH (ref 0.61–1.24)
GFR, Estimated: 50 mL/min — ABNORMAL LOW (ref >60)
Glucose, Bld: 98 mg/dL (ref 70–99)
Potassium: 4.7 mmol/L (ref 3.5–5.1)
Sodium: 138 mmol/L (ref 135–145)
Total Bilirubin: 0.6 mg/dL (ref 0.3–1.2)
Total Protein: 7.4 g/dL (ref 6.5–8.1)

## 2020-02-14 LAB — LACTATE DEHYDROGENASE: LDH: 97 U/L — ABNORMAL LOW (ref 98–192)

## 2020-02-14 NOTE — Progress Notes (Signed)
Patient denies new problems/concerns today.   °

## 2020-02-14 NOTE — Progress Notes (Signed)
Hematology/Oncology follow up note [facility] Telephone:(336) [phone] Fax:(336) [phone]   Patient Care Team: Leone Haven, MD as PCP - General (Family Medicine)  REFERRING PROVIDER: Leone Haven, MD Philis Nettle CHIEF COMPLAINTS/REASON FOR VISIT:  Follow-up for leukocytosis.  HISTORY OF PRESENTING ILLNESS:  Christian Hebert is a  75 y.o.  male with PMH listed below who was referred to me for evaluation of leukocytosis Reviewed patient' recent labs on 09/16/2017 obtained by PCP.  CBC showed elevated white count of 11.8, predominately monocytosis and neutrophilia. Previous lab records reviewed. Leukocytosis/monocytosis/neutrophilia onset of chronic, duration is since at least 2017  No aggravating or elevated factors Associated symptoms or signs: Reports unintentional weight loss since hernia surgery last year.  Not having a good appetite.  Does not eat much. No fever or chills, night sweating.  Smoking history: 50-pack-year of smoking, although stopped smoking in March 2018.  Patient admits smoking cigar occasionally.  History of recent oral steroid use or steroid injection: Denies History of recent infection: He had a history of diverticulitis.  CT 07/17/2017 was independently reviewed.  Showed mild distal descending and sigmoid diverticulitis without complicating features.  Reports having pain during the diverticulitis flare and he took a course of antibiotics, pain resolved.  Last dose of antibiotics was about 3 weeks ago.   INTERVAL HISTORY Christian Hebert is a 75 y.o. male who has above history reviewed by me today presents for follow up visit for management of leukocytosis.  Patient reports feeling well.  Patient has chronic fatigue, stable symptoms.  Weight has been stable. Patient reports good appetite.  No new complaints today.He continues to smoke daily.  Review of Systems  Constitutional: Negative for chills, fever, malaise/fatigue and  weight loss.  HENT: Negative for nosebleeds and sore throat.   Eyes: Negative for double vision, photophobia and redness.  Respiratory: Negative for cough, shortness of breath and wheezing.   Cardiovascular: Negative for chest pain, palpitations and orthopnea.  Gastrointestinal: Negative for abdominal pain, blood in stool, nausea and vomiting.  Genitourinary: Negative for dysuria.  Musculoskeletal: Negative for back pain, myalgias and neck pain.  Skin: Negative for itching and rash.  Neurological: Negative for dizziness, tingling and tremors.  Endo/Heme/Allergies: Negative for environmental allergies. Does not bruise/bleed easily.  Psychiatric/Behavioral: Negative for depression.    MEDICAL HISTORY:  Past Medical History:  Diagnosis Date  . COPD (chronic obstructive pulmonary disease) ([place])   . Coronary atherosclerosis    a. Treadmill Myoview 12/2015: Exercised for 8 minutes unable to achieve target heart rate, Lexiscan without significant ischemia, EF 55-65%  . Diverticulitis   . GERD (gastroesophageal reflux disease)   . Inguinal hernia   . Leukocytosis   . Personal history of tobacco use, presenting hazards to health 07/20/2015    SURGICAL HISTORY: Past Surgical History:  Procedure Laterality Date  . HERNIA REPAIR     bilateral inguinal   . INGUINAL HERNIA REPAIR Bilateral 09/12/2016   Procedure: LAPAROSCOPIC BILATERAL INGUINAL HERNIA REPAIR;  Surgeon: Florene Glen, MD;  Location: [facility] ORS;  Service: General;  Laterality: Bilateral;  . TONSILLECTOMY      SOCIAL HISTORY: Social History   Socioeconomic History  . Marital status: Married    Spouse name: Not on file  . Number of children: Not on file  . Years of education: Not on file  . Highest education level: Not on file  Occupational History  . Not on file  Tobacco Use  . Smoking status: Former Smoker  Packs/day: 1.00    Years: 50.00    Pack years: 50.00    Types: Cigarettes    Quit date: 01/08/2016     Years since quitting: 4.1  . Smokeless tobacco: Never Used  Vaping Use  . Vaping Use: Never used  Substance and Sexual Activity  . Alcohol use: No    Alcohol/week: 0.0 standard drinks  . Drug use: No  . Sexual activity: Not Currently  Other Topics Concern  . Not on file  Social History Narrative  . Not on file   Social Determinants of Health   Financial Resource Strain: Not on file  Food Insecurity: Not on file  Transportation Needs: Not on file  Physical Activity: Not on file  Stress: Not on file  Social Connections: Not on file  Intimate Partner Violence: Not on file    FAMILY HISTORY: Family History  Problem Relation Age of Onset  . Stroke Mother   . Hypertension Mother   . Heart disease Father   . Hypertension Father   . Lung cancer Father   . Heart disease Sister   . Cancer Neg Hx     ALLERGIES:  is allergic to oxycodone.  MEDICATIONS:  Current Outpatient Medications  Medication Sig Dispense Refill  . aspirin 81 MG chewable tablet Chew by mouth daily.    Marland Kitchen b complex vitamins capsule Take 1 capsule by mouth daily.    . Multiple Vitamins-Minerals (MULTIVITAMIN WITH MINERALS) tablet Take 1 tablet by mouth daily.    . vitamin C (ASCORBIC ACID) 250 MG tablet Take 250 mg by mouth daily.    Marland Kitchen VITAMIN D PO Take by mouth.     No current facility-administered medications for this visit.     PHYSICAL EXAMINATION: ECOG PERFORMANCE STATUS: 1 - Symptomatic but completely ambulatory Vitals:   02/14/20 1015  BP: (!) 116/55  Pulse: 62  Resp: 16  Temp: (!) 97.2 F (36.2 C)   Filed Weights   02/14/20 1015  Weight: 142 lb 3.2 oz (64.5 kg)    Physical Exam Constitutional:      General: He is not in acute distress. HENT:     Head: Normocephalic and atraumatic.  Eyes:     General: No scleral icterus.    Pupils: Pupils are equal, round, and reactive to light.  Cardiovascular:     Rate and Rhythm: Normal rate and regular rhythm.     Heart sounds: Normal heart  sounds.  Pulmonary:     Effort: Pulmonary effort is normal. No respiratory distress.     Breath sounds: No wheezing.  Abdominal:     General: Bowel sounds are normal. There is no distension.     Palpations: Abdomen is soft. There is no mass.     Tenderness: There is no abdominal tenderness.  Musculoskeletal:        General: No deformity. Normal range of motion.     Cervical back: Normal range of motion and neck supple.  Skin:    General: Skin is warm and dry.     Findings: No erythema or rash.  Neurological:     Mental Status: He is alert and oriented to person, place, and time.     Cranial Nerves: No cranial nerve deficit.     Coordination: Coordination normal.  Psychiatric:        Behavior: Behavior normal.        Thought Content: Thought content normal.     CMP Latest Ref Rng & Units 11/11/2019  Glucose  70 - 99 mg/dL 97  BUN 8 - 23 mg/dL 13  Creatinine 0.61 - 1.24 mg/dL 1.27(H)  Sodium 135 - 145 mmol/L 137  Potassium 3.5 - 5.1 mmol/L 4.6  Chloride 98 - 111 mmol/L 105  CO2 22 - 32 mmol/L 25  Calcium 8.9 - 10.3 mg/dL 9.2  Total Protein 6.5 - 8.1 g/dL 7.4  Total Bilirubin 0.3 - 1.2 mg/dL 0.8  Alkaline Phos 38 - 126 U/L 59  AST 15 - 41 U/L 14(L)  ALT 0 - 44 U/L 14   CBC Latest Ref Rng & Units 11/11/2019  WBC 4.0 - 10.5 K/uL 10.2  Hemoglobin 13.0 - 17.0 g/dL 15.1  Hematocrit 39.0 - 52.0 % 44.7  Platelets 150 - 400 K/uL 336    LABORATORY DATA:  I have reviewed the data as listed Lab Results  Component Value Date   WBC 10.2 11/11/2019   HGB 15.1 11/11/2019   HCT 44.7 11/11/2019   MCV 91.2 11/11/2019   PLT 336 11/11/2019   Recent Labs    10/04/19 1200 11/11/19 0953  NA 137 137  K 4.4 4.6  CL 103 105  CO2 25 25  GLUCOSE 87 97  BUN 15 13  CREATININE 1.24 1.27*  CALCIUM 9.4 9.2  GFRNONAA  --  59*  PROT 7.1 7.4  ALBUMIN 4.0 3.9  AST 13 14*  ALT 11 14  ALKPHOS 67 59  BILITOT 0.5 0.8   Iron/TIBC/Ferritin/ %Sat No results found for: IRON, TIBC, FERRITIN,  IRONPCTSAT    07/17/2017 CT renal stone study.  1. Mild distal descending and sigmoid diverticulitis without complicating features. 2. Centrilobular emphysema at the lung bases. 3. Stable hepatic and right renal cysts. 4. Mild prostatomegaly. 5. Bilateral herniorrhaphies without recurrent hernia  ASSESSMENT & PLAN:  1. Monoclonal B-cell lymphocytosis of undetermined significance   2. Monocytosis   3. Personal history of tobacco use, presenting hazards to health    monoclonal B cell lymphocytosis.  Labs are reviewed and discussed with patient. Stable leukocytosis.  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  Negative EBV IgM antibody panel Monocyte count 1.3 today.  Stable. Patient declined bone marrow biopsy for further work-up. No constitutional symptoms Continue observation.  Smoke cessation was discussed with patient.  Offered him to be referred to smoke cessation clinic and he declined. He has been followed by  lung cancer screening program and had Lung-RADS 2 CT chest study on 01/13/2020.  #CKD, avoid nephrotoxin. Orders Placed This Encounter  Procedures  . CBC with Differential/Platelet    Standing Status:   Future    Standing Expiration Date:   02/13/2021  . Comprehensive metabolic panel    Standing Status:   Future    Standing Expiration Date:   02/13/2021  . Lactate dehydrogenase    Standing Status:   Future    Standing Expiration Date:   02/13/2021    All questions were answered. The patient knows to call the clinic with any problems questions or concerns.  Return of visit: 6 months.   Earlie Server, MD, PhD Hematology Oncology [facility] at [facility] Pager- [phone] 02/14/2020 =

## 2020-03-01 DIAGNOSIS — Z1211 Encounter for screening for malignant neoplasm of colon: Secondary | ICD-10-CM | POA: Diagnosis not present

## 2020-03-01 LAB — COLOGUARD: Cologuard: NEGATIVE

## 2020-08-14 ENCOUNTER — Inpatient Hospital Stay: Payer: Medicare Other | Attending: Oncology

## 2020-08-14 ENCOUNTER — Encounter: Payer: Self-pay | Admitting: Oncology

## 2020-08-14 ENCOUNTER — Inpatient Hospital Stay (HOSPITAL_BASED_OUTPATIENT_CLINIC_OR_DEPARTMENT_OTHER): Payer: Medicare Other | Admitting: Oncology

## 2020-08-14 VITALS — BP 130/79 | HR 60 | Temp 97.1°F | Resp 18 | Wt 143.0 lb

## 2020-08-14 DIAGNOSIS — F17210 Nicotine dependence, cigarettes, uncomplicated: Secondary | ICD-10-CM | POA: Diagnosis not present

## 2020-08-14 DIAGNOSIS — D72820 Lymphocytosis (symptomatic): Secondary | ICD-10-CM | POA: Insufficient documentation

## 2020-08-14 DIAGNOSIS — N189 Chronic kidney disease, unspecified: Secondary | ICD-10-CM | POA: Insufficient documentation

## 2020-08-14 DIAGNOSIS — D72821 Monocytosis (symptomatic): Secondary | ICD-10-CM

## 2020-08-14 DIAGNOSIS — Z720 Tobacco use: Secondary | ICD-10-CM | POA: Diagnosis not present

## 2020-08-14 LAB — COMPREHENSIVE METABOLIC PANEL
ALT: 12 U/L (ref 0–44)
AST: 14 U/L — ABNORMAL LOW (ref 15–41)
Albumin: 3.9 g/dL (ref 3.5–5.0)
Alkaline Phosphatase: 64 U/L (ref 38–126)
Anion gap: 7 (ref 5–15)
BUN: 14 mg/dL (ref 8–23)
CO2: 26 mmol/L (ref 22–32)
Calcium: 8.7 mg/dL — ABNORMAL LOW (ref 8.9–10.3)
Chloride: 102 mmol/L (ref 98–111)
Creatinine, Ser: 1.31 mg/dL — ABNORMAL HIGH (ref 0.61–1.24)
GFR, Estimated: 57 mL/min — ABNORMAL LOW (ref >60)
Glucose, Bld: 103 mg/dL — ABNORMAL HIGH (ref 70–99)
Potassium: 4.4 mmol/L (ref 3.5–5.1)
Sodium: 135 mmol/L (ref 135–145)
Total Bilirubin: 0.4 mg/dL (ref 0.3–1.2)
Total Protein: 7.1 g/dL (ref 6.5–8.1)

## 2020-08-14 LAB — CBC WITH DIFFERENTIAL/PLATELET
Abs Immature Granulocytes: 0.06 10*3/uL (ref 0.00–0.07)
Basophils Absolute: 0.1 10*3/uL (ref 0.0–0.1)
Basophils Relative: 1 %
Eosinophils Absolute: 0.2 10*3/uL (ref 0.0–0.5)
Eosinophils Relative: 2 %
HCT: 44.1 % (ref 39.0–52.0)
Hemoglobin: 14.7 g/dL (ref 13.0–17.0)
Immature Granulocytes: 1 %
Lymphocytes Relative: 15 %
Lymphs Abs: 1.6 10*3/uL (ref 0.7–4.0)
MCH: 31 pg (ref 26.0–34.0)
MCHC: 33.3 g/dL (ref 30.0–36.0)
MCV: 93 fL (ref 80.0–100.0)
Monocytes Absolute: 1.3 10*3/uL — ABNORMAL HIGH (ref 0.1–1.0)
Monocytes Relative: 13 %
Neutro Abs: 7.2 10*3/uL (ref 1.7–7.7)
Neutrophils Relative %: 68 %
Platelets: 302 10*3/uL (ref 150–400)
RBC: 4.74 MIL/uL (ref 4.22–5.81)
RDW: 13.2 % (ref 11.5–15.5)
WBC: 10.5 10*3/uL (ref 4.0–10.5)
nRBC: 0 % (ref 0.0–0.2)

## 2020-08-14 LAB — LACTATE DEHYDROGENASE: LDH: 102 U/L (ref 98–192)

## 2020-08-14 NOTE — Progress Notes (Signed)
Patient here for oncology follow-up appointment, expresses no complaints or concerns at this time.    

## 2020-08-14 NOTE — Progress Notes (Signed)
Hematology/Oncology follow up note [facility] Telephone:(336) [phone] Fax:(336) [phone]   Patient Care Team: Leone Haven, MD as PCP - General (Family Medicine)  REFERRING PROVIDER: Leone Haven, MD Philis Nettle CHIEF COMPLAINTS/REASON FOR VISIT:  Follow-up for leukocytosis.  HISTORY OF PRESENTING ILLNESS:  Christian Hebert is a  75 y.o.  male with PMH listed below who was referred to me for evaluation of leukocytosis Reviewed patient' recent labs on 09/16/2017 obtained by PCP.  CBC showed elevated white count of 11.8, predominately monocytosis and neutrophilia. Previous lab records reviewed. Leukocytosis/monocytosis/neutrophilia onset of chronic, duration is since at least 2017  No aggravating or elevated factors Associated symptoms or signs: Reports unintentional weight loss since hernia surgery last year.  Not having a good appetite.  Does not eat much. No fever or chills, night sweating.  Smoking history: 50-pack-year of smoking, although stopped smoking in March 2018.  Patient admits smoking cigar occasionally.  History of recent oral steroid use or steroid injection: Denies History of recent infection: He had a history of diverticulitis.  CT 07/17/2017 was independently reviewed.  Showed mild distal descending and sigmoid diverticulitis without complicating features.  Reports having pain during the diverticulitis flare and he took a course of antibiotics, pain resolved.  Last dose of antibiotics was about 3 weeks ago.   Christian HISTORY ETHON Hebert is a 75 y.o. male who has above history reviewed by me today presents for follow up visit for management of leukocytosis.  Patient reports feeling well.  Patient has chronic fatigue, stable symptoms.  Weight has been stable. Patient reports good appetite.  No new complaints today he continues to smoke daily.  Review of Systems  Constitutional:  Negative for chills, fever, malaise/fatigue and  weight loss.  HENT:  Negative for nosebleeds and sore throat.   Eyes:  Negative for double vision, photophobia and redness.  Respiratory:  Negative for cough, shortness of breath and wheezing.   Cardiovascular:  Negative for chest pain, palpitations and orthopnea.  Gastrointestinal:  Negative for abdominal pain, blood in stool, nausea and vomiting.  Genitourinary:  Negative for dysuria.  Musculoskeletal:  Negative for back pain, myalgias and neck pain.  Skin:  Negative for itching and rash.  Neurological:  Negative for dizziness, tingling and tremors.  Endo/Heme/Allergies:  Negative for environmental allergies. Does not bruise/bleed easily.  Psychiatric/Behavioral:  Negative for depression.    MEDICAL HISTORY:  Past Medical History:  Diagnosis Date   COPD (chronic obstructive pulmonary disease) ([place])    Coronary atherosclerosis    a. Treadmill Myoview 12/2015: Exercised for 8 minutes unable to achieve target heart rate, Lexiscan without significant ischemia, EF 55-65%   Diverticulitis    GERD (gastroesophageal reflux disease)    Inguinal hernia    Leukocytosis    Personal history of tobacco use, presenting hazards to health 07/20/2015    SURGICAL HISTORY: Past Surgical History:  Procedure Laterality Date   HERNIA REPAIR     bilateral inguinal    INGUINAL HERNIA REPAIR Bilateral 09/12/2016   Procedure: LAPAROSCOPIC BILATERAL INGUINAL HERNIA REPAIR;  Surgeon: Florene Glen, MD;  Location: [facility] ORS;  Service: General;  Laterality: Bilateral;   TONSILLECTOMY      SOCIAL HISTORY: Social History   Socioeconomic History   Marital status: Married    Spouse name: Not on file   Number of children: Not on file   Years of education: Not on file   Highest education level: Not on file  Occupational History  Not on file  Tobacco Use   Smoking status: Former    Packs/day: 1.00    Years: 50.00    Pack years: 50.00    Types: Cigarettes    Quit date: 01/08/2016    Years since  quitting: 4.6   Smokeless tobacco: Never  Vaping Use   Vaping Use: Never used  Substance and Sexual Activity   Alcohol use: No    Alcohol/week: 0.0 standard drinks   Drug use: No   Sexual activity: Not Currently  Other Topics Concern   Not on file  Social History Narrative   Not on file   Social Determinants of Health   Financial Resource Strain: Not on file  Food Insecurity: Not on file  Transportation Needs: Not on file  Physical Activity: Not on file  Stress: Not on file  Social Connections: Not on file  Intimate Partner Violence: Not on file    FAMILY HISTORY: Family History  Problem Relation Age of Onset   Stroke Mother    Hypertension Mother    Heart disease Father    Hypertension Father    Lung cancer Father    Heart disease Sister    Cancer Neg Hx     ALLERGIES:  is allergic to oxycodone.  MEDICATIONS:  Current Outpatient Medications  Medication Sig Dispense Refill   aspirin 81 MG chewable tablet Chew by mouth daily.     b complex vitamins capsule Take 1 capsule by mouth daily.     Multiple Vitamins-Minerals (MULTIVITAMIN WITH MINERALS) tablet Take 1 tablet by mouth daily.     Probiotic Product (PROBIOTIC DAILY PO) Take by mouth.     vitamin C (ASCORBIC ACID) 250 MG tablet Take 250 mg by mouth daily.     VITAMIN D PO Take by mouth.     No current facility-administered medications for this visit.     PHYSICAL EXAMINATION: ECOG PERFORMANCE STATUS: 1 - Symptomatic but completely ambulatory Vitals:   08/14/20 1008  BP: 130/79  Pulse: 60  Resp: 18  Temp: (!) 97.1 F (36.2 C)  SpO2: 99%   Filed Weights   08/14/20 1008  Weight: 143 lb (64.9 kg)    Physical Exam Constitutional:      General: He is not in acute distress. HENT:     Head: Normocephalic and atraumatic.  Eyes:     General: No scleral icterus.    Pupils: Pupils are equal, round, and reactive to light.  Cardiovascular:     Rate and Rhythm: Normal rate and regular rhythm.      Heart sounds: Normal heart sounds.  Pulmonary:     Effort: Pulmonary effort is normal. No respiratory distress.     Breath sounds: No wheezing.  Abdominal:     General: Bowel sounds are normal. There is no distension.     Palpations: Abdomen is soft. There is no mass.     Tenderness: There is no abdominal tenderness.  Musculoskeletal:        General: No deformity. Normal range of motion.     Cervical back: Normal range of motion and neck supple.  Skin:    General: Skin is warm and dry.     Findings: No erythema or rash.  Neurological:     Mental Status: He is alert and oriented to person, place, and time.     Cranial Nerves: No cranial nerve deficit.     Coordination: Coordination normal.  Psychiatric:        Behavior: Behavior normal.  Thought Content: Thought content normal.    CMP Latest Ref Rng & Units 08/14/2020  Glucose 70 - 99 mg/dL 103(H)  BUN 8 - 23 mg/dL 14  Creatinine 0.61 - 1.24 mg/dL 1.31(H)  Sodium 135 - 145 mmol/L 135  Potassium 3.5 - 5.1 mmol/L 4.4  Chloride 98 - 111 mmol/L 102  CO2 22 - 32 mmol/L 26  Calcium 8.9 - 10.3 mg/dL 8.7(L)  Total Protein 6.5 - 8.1 g/dL 7.1  Total Bilirubin 0.3 - 1.2 mg/dL 0.4  Alkaline Phos 38 - 126 U/L 64  AST 15 - 41 U/L 14(L)  ALT 0 - 44 U/L 12   CBC Latest Ref Rng & Units 08/14/2020  WBC 4.0 - 10.5 K/uL 10.5  Hemoglobin 13.0 - 17.0 g/dL 14.7  Hematocrit 39.0 - 52.0 % 44.1  Platelets 150 - 400 K/uL 302    LABORATORY DATA:  I have reviewed the data as listed Lab Results  Component Value Date   WBC 10.5 08/14/2020   HGB 14.7 08/14/2020   HCT 44.1 08/14/2020   MCV 93.0 08/14/2020   PLT 302 08/14/2020   Recent Labs    11/11/19 0953 02/14/20 0959 08/14/20 0924  NA 137 138 135  K 4.6 4.7 4.4  CL 105 105 102  CO2 _0 GLUCOSE 97 98 103*  BUN _1 CREATININE 1.27* 1.45* 1.31*  CALCIUM 9.2 8.9 8.7*  GFRNONAA 59* 50* 57*  PROT 7.4 7.4 7.1  ALBUMIN 3.9 3.5 3.9  AST 14* 13* 14*  ALT _2 ALKPHOS 59 63 64  BILITOT 0.8 0.6 0.4    Iron/TIBC/Ferritin/ %Sat No results found for: IRON, TIBC, FERRITIN, IRONPCTSAT    07/17/2017 CT renal stone study.  1. Mild distal descending and sigmoid diverticulitis without complicating features. 2. Centrilobular emphysema at the lung bases. 3. Stable hepatic and right renal cysts. 4. Mild prostatomegaly. 5. Bilateral herniorrhaphies without recurrent hernia  ASSESSMENT & PLAN:  1. Monoclonal B-cell lymphocytosis of undetermined significance   2. Monocytosis   3. Tobacco use    monoclonal B cell lymphocytosis.  Labs are reviewed and discussed with patient.  Stable leukocytosis.  #Chronic persistent monocytosis, likely reactive due to smoking.  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  Negative EBV IgM antibody panel He previously declined bone marrow biopsy for further work-up. Labs are reviewed and discussed with him.  Stable monocyte count.  He has no constitutional symptoms Continue observation  Smoke cessation was discussed with patient.  Offered him to be referred to smoke cessation clinic and he declined. He has been followed by  lung cancer screening program and had Lung-RADS 2 CT chest study on 01/13/2020.  #CKD, avoid nephrotoxin. Orders Placed This Encounter  Procedures   CBC with Differential/Platelet    Standing Status:   Future    Standing Expiration Date:   08/14/2021   Comprehensive metabolic panel    Standing Status:   Future    Standing Expiration Date:   08/14/2021   Lactate dehydrogenase    Standing Status:   Future    Standing Expiration Date:   08/14/2021    All questions were answered. The patient knows to call the clinic with any problems questions or concerns.  Return of visit: 12 months.   Earlie Server, MD, PhD Hematology Oncology [facility] Pager- [phone] 08/14/2020 =

## 2020-09-13 ENCOUNTER — Ambulatory Visit: Payer: Medicare Other

## 2020-09-19 ENCOUNTER — Ambulatory Visit (INDEPENDENT_AMBULATORY_CARE_PROVIDER_SITE_OTHER): Payer: Medicare Other

## 2020-09-19 VITALS — Ht 71.0 in | Wt 143.0 lb

## 2020-09-19 DIAGNOSIS — Z0000 Encounter for general adult medical examination without abnormal findings: Secondary | ICD-10-CM | POA: Diagnosis not present

## 2020-09-19 NOTE — Progress Notes (Signed)
Subjective:   Christian Hebert is a 75 y.o. male who presents for Medicare Annual/Subsequent preventive examination.  Review of Systems    No ROS.  Medicare Wellness Virtual Visit.  Visual/audio telehealth visit, UTA vital signs.   See social history for additional risk factors.   Cardiac Risk Factors include: advanced age (>2mn, >>30women);male gender     Objective:    Today's Vitals   09/19/20 1320  Weight: 143 lb (64.9 kg)  Height: '5\' 11"'$  (1.803 m)   Body mass index is 19.94 kg/m.  Advanced Directives 09/19/2020 02/14/2020 11/11/2019 06/25/2018 01/27/2018 12/26/2017 10/03/2017  Does Patient Have a Medical Advance Directive? Yes Yes Yes Yes Yes Yes Yes  Type of AParamed[place]of [place] will Living will;Healthcare Power of [place] - -  Does patient want to make changes to medical advance directive? - - No - Patient declined - No - Patient declined - -  Copy of HMauiin Chart? No - copy requested - - - No - copy requested - -    Current Medications (verified) Outpatient Encounter Medications as of 09/19/2020  Medication Sig   aspirin 81 MG chewable tablet Chew by mouth daily.   b complex vitamins capsule Take 1 capsule by mouth daily.   Multiple Vitamins-Minerals (MULTIVITAMIN WITH MINERALS) tablet Take 1 tablet by mouth daily.   Probiotic Product (PROBIOTIC DAILY PO) Take by mouth.   vitamin C (ASCORBIC ACID) 250 MG tablet Take 250 mg by mouth daily.   VITAMIN D PO Take by mouth.   No facility-administered encounter medications on file as of 09/19/2020.    Allergies (verified) Oxycodone   History: Past Medical History:  Diagnosis Date   COPD (chronic obstructive pulmonary disease) (HCC)    Coronary atherosclerosis    a. Treadmill Myoview 12/2015: Exercised for 8 minutes unable to achieve target heart rate, Lexiscan without significant  ischemia, EF 55-65%   Diverticulitis    GERD (gastroesophageal reflux disease)    Inguinal hernia    Leukocytosis    Personal history of tobacco use, presenting hazards to health 07/20/2015   Past Surgical History:  Procedure Laterality Date   HERNIA REPAIR     bilateral inguinal    INGUINAL HERNIA REPAIR Bilateral 09/12/2016   Procedure: LAPAROSCOPIC BILATERAL INGUINAL HERNIA REPAIR;  Surgeon: CFlorene Glen MD;  Location: [facility] ORS;  Service: General;  Laterality: Bilateral;   TONSILLECTOMY     Family History  Problem Relation Age of Onset   Stroke Mother    Hypertension Mother    Heart disease Father    Hypertension Father    Lung cancer Father    Heart disease Sister    Cancer Neg Hx    Social History   Socioeconomic History   Marital status: Married    Spouse name: Not on file   Number of children: Not on file   Years of education: Not on file   Highest education level: Not on file  Occupational History   Not on file  Tobacco Use   Smoking status: Former    Packs/day: 1.00    Years: 50.00    Pack years: 50.00    Types: Cigarettes    Quit date: 01/08/2016    Years since quitting: 4.7   Smokeless tobacco: Never  Vaping Use   Vaping Use: Never used  Substance and Sexual Activity   Alcohol use: No  Alcohol/week: 0.0 standard drinks   Drug use: No   Sexual activity: Not Currently  Other Topics Concern   Not on file  Social History Narrative   Not on file   Social Determinants of Health   Financial Resource Strain: Low Risk    Difficulty of Paying Living Expenses: Not hard at all  Food Insecurity: No Food Insecurity   Worried About Charity fundraiser in the Last Year: Never true   [place] in the Last Year: Never true  Transportation Needs: No Transportation Needs   Lack of Transportation (Medical): No   Lack of Transportation (Non-Medical): No  Physical Activity: Insufficiently Active   Days of Exercise per Week: 3 days   Minutes of Exercise  per Session: 30 min  Stress: No Stress Concern Present   Feeling of Stress : Not at all  Social Connections: Unknown   Frequency of Communication with Friends and Family: Not on file   Frequency of Social Gatherings with Friends and Family: Not on file   Attends Religious Services: Not on Electrical engineer or Organizations: Not on file   Attends Archivist Meetings: Not on file   Marital Status: Married    Tobacco Counseling Counseling given: Not Answered   Clinical Intake:  Pre-visit preparation completed: Yes        Diabetes: No  How often do you need to have someone help you when you read instructions, pamphlets, or other written materials from your doctor or pharmacy?: 1 - Never    Interpreter Needed?: No      Activities of Daily Living In your present state of health, do you have any difficulty performing the following activities: 09/19/2020  Hearing? Y  Comment Hearing aids  Vision? N  Difficulty concentrating or making decisions? N  Walking or climbing stairs? N  Dressing or bathing? N  Doing errands, shopping? N  Preparing Food and eating ? N  Using the Toilet? N  In the past six months, have you accidently leaked urine? N  Do you have problems with loss of bowel control? N  Managing your Medications? N  Managing your Finances? N  Housekeeping or managing your Housekeeping? N  Some recent data might be hidden    Patient Care Team: Leone Haven, MD as PCP - General (Family Medicine)  Indicate any recent Medical Services you may have received from other than [facility] providers in the past year (date may be approximate).     Assessment:   This is a routine wellness examination for [name].  I connected with Jereth today by telephone and verified that I am speaking with the correct person using two identifiers. Location patient: home Location provider: work Persons participating in the virtual visit: patient, Marine scientist.    I  discussed the limitations, risks, security and privacy concerns of performing an evaluation and management service by telephone and the availability of in person appointments. The patient expressed understanding and verbally consented to this telephonic visit.    Interactive audio and video telecommunications were attempted between this provider and patient, however failed, due to patient having technical difficulties OR patient did not have access to video capability.  We continued and completed visit with audio only.  Some vital signs may be absent or patient reported.   Hearing/Vision screen Hearing Screening - Comments:: Hearing aids Vision Screening - Comments:: Wears corrective lenses when reading  Visual acuity not assessed per patient preference.   Dietary  issues and exercise activities discussed: Current Exercise Habits: Home exercise routine, Time (Minutes): 60, Frequency (Times/Week): 3, Weekly Exercise (Minutes/Week): 180, Intensity: Mild Regular diet Good water intake   Goals Addressed               This Visit's Progress     Patient Stated     Maintain Healthy Lifestyle (pt-stated)   On track     Stay active Monitor sugar intake       Depression Screen PHQ 2/9 Scores 09/19/2020 10/04/2019 01/27/2018 07/16/2016 07/15/2016  PHQ - 2 Score 0 0 0 0 0  PHQ- 9 Score - - - 0 -  Exception Documentation - - - Patient refusal -    Fall Risk Fall Risk  09/19/2020 10/04/2019 07/09/2018 01/27/2018 09/02/2017  Falls in the past year? 0 0 0 0 No  Comment - - - - -  Number falls in past yr: 0 0 0 - -  Injury with Fall? 0 - 0 - -  Follow up Falls evaluation completed Falls evaluation completed Falls evaluation completed - -    FALL RISK PREVENTION PERTAINING TO THE HOME: Adequate lighting in your home to reduce risk of falls? Yes   ASSISTIVE DEVICES UTILIZED TO PREVENT FALLS: Use of a cane, walker or w/c? No   TIMED UP AND GO: Was the test performed? No .   Cognitive  Function: Patient is alert and oriented x3.   MMSE - Mini Mental State Exam 07/15/2016  Orientation to time 5  Orientation to Place 5  Registration 3  Attention/ Calculation 5  Recall 3  Language- name 2 objects 2  Language- repeat 1  Language- follow 3 step command 3  Language- read & follow direction 1  Write a sentence 1  Copy design 1  Total score 30     6CIT Screen 01/27/2018  What Year? 0 points  What month? 0 points  What time? 0 points  Count back from 20 0 points  Months in reverse 2 points  Repeat phrase 0 points  Total Score 2    Immunizations Immunization History  Administered Date(s) Administered   PFIZER(Purple Top)SARS-COV-2 Vaccination 08/16/2019   TDAP status: Due, Education has been provided regarding the importance of this vaccine. Advised may receive this vaccine at local pharmacy or Health Dept. Aware to provide a copy of the vaccination record if obtained from local pharmacy or Health Dept. Verbalized acceptance and understanding. Deferred.   Shingles vaccine- Due, Education has been provided regarding the importance of this vaccine. Advised may receive this vaccine at local pharmacy or Health Dept. Aware to provide a copy of the vaccination record if obtained from local pharmacy or Health Dept. Verbalized acceptance and understanding. Deferred.   Health Maintenance Health Maintenance  Topic Date Due   Zoster Vaccines- Shingrix (1 of 2) 12/19/2020 (Originally 01/01/1965)   TETANUS/TDAP  09/19/2021 (Originally 01/01/1965)   Fecal DNA (Cologuard)  03/02/2023   Hepatitis C Screening  Completed   HPV VACCINES  Aged Out   INFLUENZA VACCINE  Discontinued   COVID-19 Vaccine  Discontinued   PNA vac Low Risk Adult  Discontinued   Lung Cancer Screening: (Low Dose CT Chest recommended if Age 9-80 years, 30 pack-year currently smoking OR have quit w/in 15years.) Completed 01/13/20.   Vision Screening: Recommended annual ophthalmology exams for early detection  of glaucoma and other disorders of the eye. Is the patient up to date with their annual eye exam?  Yes   Dental Screening: Recommended  annual dental exams for proper oral hygiene  Community Resource Referral / Chronic Care Management: CRR required this visit?  No   CCM required this visit?  No      Plan:   Keep all routine maintenance appointments.   I have personally reviewed and noted the following in the patient's chart:   Medical and social history Use of alcohol, tobacco or illicit drugs  Current medications and supplements including opioid prescriptions. Patient is not currently taking opioid prescriptions. Functional ability and status Nutritional status Physical activity Advanced directives List of other physicians Hospitalizations, surgeries, and ER visits in previous 12 months Vitals Screenings to include cognitive, depression, and falls Referrals and appointments  In addition, I have reviewed and discussed with patient certain preventive protocols, quality metrics, and best practice recommendations. A written personalized care plan for preventive services as well as general preventive health recommendations were provided to patient via mychart.     Varney Biles, LPN   [phone]

## 2020-09-19 NOTE — Patient Instructions (Addendum)
  Mr. Christian Hebert , Thank you for taking time to come for your Medicare Wellness Visit. I appreciate your ongoing commitment to your health goals. Please review the following plan we discussed and let me know if I can assist you in the future.   These are the goals we discussed:  Goals       Patient Stated     Maintain Healthy Lifestyle (pt-stated)      Stay active Monitor sugar intake        This is a list of the screening recommended for you and due dates:  Health Maintenance  Topic Date Due   Zoster (Shingles) Vaccine (1 of 2) 12/19/2020*   Tetanus Vaccine  09/19/2021*   Cologuard (Stool DNA test)  03/02/2023   Hepatitis C Screening: USPSTF Recommendation to screen - Ages 18-79 yo.  Completed   HPV Vaccine  Aged Out   Flu Shot  Discontinued   COVID-19 Vaccine  Discontinued   Pneumonia vaccines  Discontinued  *Topic was postponed. The date shown is not the original due date.

## 2020-11-24 ENCOUNTER — Ambulatory Visit (INDEPENDENT_AMBULATORY_CARE_PROVIDER_SITE_OTHER): Payer: Medicare Other | Admitting: Family Medicine

## 2020-11-24 ENCOUNTER — Other Ambulatory Visit: Payer: Self-pay

## 2020-11-24 ENCOUNTER — Encounter: Payer: Self-pay | Admitting: Family Medicine

## 2020-11-24 VITALS — BP 140/80 | HR 69 | Temp 98.3°F | Ht 71.0 in | Wt 145.8 lb

## 2020-11-24 DIAGNOSIS — R7303 Prediabetes: Secondary | ICD-10-CM

## 2020-11-24 DIAGNOSIS — R197 Diarrhea, unspecified: Secondary | ICD-10-CM | POA: Diagnosis not present

## 2020-11-24 DIAGNOSIS — R1032 Left lower quadrant pain: Secondary | ICD-10-CM

## 2020-11-24 DIAGNOSIS — E782 Mixed hyperlipidemia: Secondary | ICD-10-CM

## 2020-11-24 MED ORDER — AMOXICILLIN-POT CLAVULANATE 875-125 MG PO TABS: 1.0000 | ORAL_TABLET | Freq: Three times a day (TID) | ORAL | 0 refills | Status: DC

## 2020-11-24 NOTE — Assessment & Plan Note (Signed)
Check A1c. 

## 2020-11-24 NOTE — Patient Instructions (Signed)
Nice to see you. I sent in Augmentin for you to start on to cover for possible diverticulitis.  If you develop worsening pain or you develop blood in your stool you need to go to the emergency department for evaluation.  If your symptoms are not improving please let us know. We will get lab work today and contact you with the results.

## 2020-11-24 NOTE — Assessment & Plan Note (Addendum)
Patient with intermittent loose stools becoming more persistent after eating over the last several weeks.  He does have some left lower quadrant tenderness and in general he reports this is somewhat similar to when he had diverticulitis previously.  Given the consistency with his prior episode of diverticulitis we will proceed with empiric antibiotic treatment with Augmentin 1 tablet 3 times daily for 7 days.  Creatinine clearance was confirmed.  If not improving discussed the need for a CT scan and potential GI evaluation.

## 2020-11-24 NOTE — Progress Notes (Signed)
[name], MD Phone: [phone]  Christian Hebert is a 75 y.o. male who presents today for same day visit.    GI issues: Patient reports for many months now he has had issues with loose stools in the morning.  They occur 3-4 times after eating.  They are not pure liquid.  Over the last few weeks have been occurring daily.  He has had some left low back pain and left lower quadrant pain associated with this.  He reports similar discomfort when he had diverticulitis in the past.  He does note some nausea after eating dinner.  He notes no blood in his stool.  He notes no dysphagia to foods though does note his large vitamins get stuck in his throat at times.  No weight loss.  His symptoms do not seem to be associated with specific foods.  He does note some seepage from his rectum after going to the bathroom.  Social History   Tobacco Use  Smoking Status Former   Packs/day: 1.00   Years: 50.00   Pack years: 50.00   Types: Cigarettes   Quit date: 01/08/2016   Years since quitting: 4.8  Smokeless Tobacco Never    Current Outpatient Medications on File Prior to Visit  Medication Sig Dispense Refill   aspirin 81 MG chewable tablet Chew by mouth daily.     b complex vitamins capsule Take 1 capsule by mouth daily.     Multiple Vitamins-Minerals (MULTIVITAMIN WITH MINERALS) tablet Take 1 tablet by mouth daily.     Probiotic Product (PROBIOTIC DAILY PO) Take by mouth.     vitamin C (ASCORBIC ACID) 250 MG tablet Take 250 mg by mouth daily.     VITAMIN D PO Take by mouth.     No current facility-administered medications on file prior to visit.     ROS see history of present illness  Objective  Physical Exam Vitals:   11/24/20 1408  BP: 140/80  Pulse: 69  Temp: 98.3 F (36.8 C)  SpO2: 95%    BP Readings from Last 3 Encounters:  11/24/20 140/80  08/14/20 130/79  02/14/20 (!) 116/55   Wt Readings from Last 3 Encounters:  11/24/20 145 lb 12.8 oz (66.1 kg)  09/19/20 143 lb  (64.9 kg)  08/14/20 143 lb (64.9 kg)    Physical Exam Constitutional:      General: He is not in acute distress.    Appearance: He is not diaphoretic.  Cardiovascular:     Rate and Rhythm: Normal rate and regular rhythm.     Heart sounds: Normal heart sounds.  Pulmonary:     Effort: Pulmonary effort is normal.     Breath sounds: Normal breath sounds.  Abdominal:     General: Bowel sounds are normal. There is no distension.     Palpations: Abdomen is soft.     Tenderness: There is abdominal tenderness (Very mild left lower quadrant tenderness). There is no guarding or rebound.  Skin:    General: Skin is warm and dry.  Neurological:     Mental Status: He is alert.     Assessment/Plan: Please see individual problem list.  Problem List Items Addressed This Visit     Diarrhea    Patient with intermittent loose stools becoming more persistent after eating over the last several weeks.  He does have some left lower quadrant tenderness and in general he reports this is somewhat similar to when he had diverticulitis previously.  Given the consistency with  his prior episode of diverticulitis we will proceed with empiric antibiotic treatment with Augmentin 1 tablet 3 times daily for 7 days.  Creatinine clearance was confirmed.  If not improving discussed the need for a CT scan and potential GI evaluation.      Relevant Medications   amoxicillin-clavulanate (AUGMENTIN) 875-125 MG tablet   Hyperlipidemia    Check lipid panel.      Relevant Orders   Lipid panel   Prediabetes    Check A1c.      Relevant Orders   HgB A1c   Other Visit Diagnoses     LLQ pain    -  Primary   Relevant Medications   amoxicillin-clavulanate (AUGMENTIN) 875-125 MG tablet       Return in about 3 months (around 02/24/2021).  This visit occurred during the SARS-CoV-2 public health emergency.  Safety protocols were in place, including screening questions prior to the visit, additional usage of staff  PPE, and extensive cleaning of exam room while observing appropriate contact time as indicated for disinfecting solutions.    [name], MD [place]

## 2020-11-24 NOTE — Assessment & Plan Note (Signed)
Check lipid panel  

## 2020-11-25 LAB — HEMOGLOBIN A1C
Hgb A1c MFr Bld: 5.7 % of total Hgb — ABNORMAL HIGH (ref <5.7)
Mean Plasma Glucose: 117 mg/dL
eAG (mmol/L): 6.5 mmol/L

## 2020-11-25 LAB — LIPID PANEL
Cholesterol: 143 mg/dL (ref <200)
HDL: 26 mg/dL — ABNORMAL LOW (ref >=40)
LDL Cholesterol (Calc): 89 mg/dL (calc)
Non-HDL Cholesterol (Calc): 117 mg/dL (calc) (ref <130)
Total CHOL/HDL Ratio: 5.5 (calc) — ABNORMAL HIGH (ref <5.0)
Triglycerides: 182 mg/dL — ABNORMAL HIGH (ref <150)

## 2020-12-21 ENCOUNTER — Encounter: Payer: Self-pay | Admitting: Family Medicine

## 2020-12-22 NOTE — Telephone Encounter (Signed)
I called and spoke with the patient to schedule a visit but he stated he is feeling much better.  Analisa Sledd,cma

## 2020-12-29 ENCOUNTER — Encounter: Payer: Self-pay | Admitting: Cardiovascular Disease

## 2020-12-29 ENCOUNTER — Ambulatory Visit (INDEPENDENT_AMBULATORY_CARE_PROVIDER_SITE_OTHER): Payer: Medicare Other | Admitting: Cardiovascular Disease

## 2020-12-29 ENCOUNTER — Other Ambulatory Visit: Payer: Self-pay

## 2020-12-29 VITALS — BP 120/60 | HR 65 | Ht 71.5 in | Wt 138.5 lb

## 2020-12-29 DIAGNOSIS — Z720 Tobacco use: Secondary | ICD-10-CM

## 2020-12-29 DIAGNOSIS — I2510 Atherosclerotic heart disease of native coronary artery without angina pectoris: Secondary | ICD-10-CM | POA: Diagnosis not present

## 2020-12-29 DIAGNOSIS — E782 Mixed hyperlipidemia: Secondary | ICD-10-CM | POA: Diagnosis not present

## 2020-12-29 NOTE — Patient Instructions (Addendum)
Medication Instructions:  - Your physician recommends that you continue on your current medications as directed. Please refer to the Current Medication list given to you today.  *If you need a refill on your cardiac medications before your next appointment, please call your pharmacy*   Lab Work: - none ordered  If you have labs (blood work) drawn today and your tests are completely normal, you will receive your results only by: [place] (if you have MyChart) OR A paper copy in the mail If you have any lab test that is abnormal or we need to change your treatment, we will call you to review the results.   Testing/Procedures: - none ordered   Follow-Up: At [facility], you and your health needs are our priority.  As part of our continuing mission to provide you with exceptional heart care, we have created designated Provider Care Teams.  These Care Teams include your primary Cardiologist (physician) and Advanced Practice Providers (APPs -  Physician Assistants and Nurse Practitioners) who all work together to provide you with the care you need, when you need it.  We recommend signing up for the patient portal called "MyChart".  Sign up information is provided on this After Visit Summary.  MyChart is used to connect with patients for Virtual Visits (Telemedicine).  Patients are able to view lab/test results, encounter notes, upcoming appointments, etc.  Non-urgent messages can be sent to your provider as well.   To learn more about what you can do with MyChart, go to [email or web address].    Your next appointment:   1 year(s)  The format for your next appointment:   In Person  Provider:   You may see Kathlyn Sacramento, MD or one of the following Advanced Practice Providers on your designated Care Team:   Murray Hodgkins, NP Christell Faith, PA-C Cadence Kathlen Mody, [place]    Other Instructions  1) For further assistance with smoking cessation, please call 1-800-QUIT-NOW  Steps  to Quit Smoking Smoking tobacco is the leading cause of preventable death. It can affect almost every organ in the body. Smoking puts you and people around you at risk for many serious, long-lasting (chronic) diseases. Quitting smoking can be hard, but it is one of the best things that you can do for your health. It is never too late to quit. How do I get ready to quit? When you decide to quit smoking, make a plan to help you succeed. Before you quit: Pick a date to quit. Set a date within the next 2 weeks to give you time to prepare. Write down the reasons why you are quitting. Keep this list in places where you will see it often. Tell your family, friends, and co-workers that you are quitting. Their support is important. Talk with your doctor about the choices that may help you quit. Find out if your health insurance will pay for these treatments. Know the people, places, things, and activities that make you want to smoke (triggers). Avoid them. What first steps can I take to quit smoking? Throw away all cigarettes at home, at work, and in your car. Throw away the things that you use when you smoke, such as ashtrays and lighters. Clean your car. Make sure to empty the ashtray. Clean your home, including curtains and carpets. What can I do to help me quit smoking? Talk with your doctor about taking medicines and seeing a counselor at the same time. You are more likely to succeed when you do both. If  you are pregnant or breastfeeding, talk with your doctor about counseling or other ways to quit smoking. Do not take medicine to help you quit smoking unless your doctor tells you to do so. To quit smoking: Quit right away Quit smoking totally, instead of slowly cutting back on how much you smoke over a period of time. Go to counseling. You are more likely to quit if you go to counseling sessions regularly. Take medicine You may take medicines to help you quit. Some medicines need a prescription,  and some you can buy over-the-counter. Some medicines may contain a drug called nicotine to replace the nicotine in cigarettes. Medicines may: Help you to stop having the desire to smoke (cravings). Help to stop the problems that come when you stop smoking (withdrawal symptoms). Your doctor may ask you to use: Nicotine patches, gum, or lozenges. Nicotine inhalers or sprays. Non-nicotine medicine that is taken by mouth. Find resources Find resources and other ways to help you quit smoking and remain smoke-free after you quit. These resources are most helpful when you use them often. They include: Online chats with a Social worker. Phone quitlines. Printed Furniture conservator/restorer. Support groups or group counseling. Text messaging programs. Mobile phone apps. Use apps on your mobile phone or tablet that can help you stick to your quit plan. There are many free apps for mobile phones and tablets as well as websites. Examples include Quit Guide from the [employer] and smokefree.gov  What things can I do to make it easier to quit?  Talk to your family and friends. Ask them to support and encourage you. Call a phone quitline (1-800-QUIT-NOW), reach out to support groups, or work with a Social worker. Ask people who smoke to not smoke around you. Avoid places that make you want to smoke, such as: Bars. Parties. Smoke-break areas at work. Spend time with people who do not smoke. Lower the stress in your life. Stress can make you want to smoke. Try these things to help your stress: Getting regular exercise. Doing deep-breathing exercises. Doing yoga. Meditating. Doing a body scan. To do this, close your eyes, focus on one area of your body at a time from head to toe. Notice which parts of your body are tense. Try to relax the muscles in those areas. How will I feel when I quit smoking? Day 1 to 3 weeks Within the first 24 hours, you may start to have some problems that come from quitting tobacco. These  problems are very bad 2-3 days after you quit, but they do not often last for more than 2-3 weeks. You may get these symptoms: Mood swings. Feeling restless, nervous, angry, or annoyed. Trouble concentrating. Dizziness. Strong desire for high-sugar foods and nicotine. Weight gain. Trouble pooping (constipation). Feeling like you may vomit (nausea). Coughing or a sore throat. Changes in how the medicines that you take for other issues work in your body. Depression. Trouble sleeping (insomnia). Week 3 and afterward After the first 2-3 weeks of quitting, you may start to notice more positive results, such as: Better sense of smell and taste. Less coughing and sore throat. Slower heart rate. Lower blood pressure. Clearer skin. Better breathing. Fewer sick days. Quitting smoking can be hard. Do not give up if you fail the first time. Some people need to try a few times before they succeed. Do your best to stick to your quit plan, and talk with your doctor if you have any questions or concerns. Summary Smoking tobacco is the leading  cause of preventable death. Quitting smoking can be hard, but it is one of the best things that you can do for your health. When you decide to quit smoking, make a plan to help you succeed. Quit smoking right away, not slowly over a period of time. When you start quitting, seek help from your doctor, family, or friends. This information is not intended to replace advice given to you by your health care provider. Make sure you discuss any questions you have with your health care provider. Document Revised: 09/01/2020 Document Reviewed: 03/14/2018 Elsevier Patient Education  [employer].

## 2020-12-29 NOTE — Progress Notes (Signed)
Cardiology Office Note   Date:  12/29/2020   ID:  Christian, Hebert [birth year], MRN [id number]  PCP:  Christian Haven, MD  Cardiologist:   Kathlyn Sacramento, MD   Chief Complaint  Patient presents with   Other    12 Month f/u no complaint today. Meds reviewed verbally with pt.      History of Present Illness: Christian Hebert is a 75 y.o. male who is here today for a follow-up visit regarding coronary atherosclerosis and asymptomatic bradycardia. He has known history of COPD and tobacco use.  He is known to have aortic and coronary calcifications in the RCA and LAD distribution noted on previous CT scan of the lungs.  Abdominal aortic ultrasound showed no evidence of aortic aneurysm.   He has no hypertension or diabetes. He underwent a treadmill Myoview in July 2018 which was normal. The patient has known history of mild asymptomatic bradycardia.  He continues to smoke half a pack per day.    He had an echocardiogram done in January 2021 which showed an EF of 50 to 85%, grade 1 diastolic dysfunction, mildly elevated pulmonary pressure. Due to carotid bruit, he had carotid Doppler done in January 2021 which showed mild nonobstructive disease bilaterally.  He has been doing reasonably well with no chest pain or worsening dyspnea.  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  He does not require treatment for this.  Past Medical History:  Diagnosis Date   COPD (chronic obstructive pulmonary disease) ([place])    Coronary atherosclerosis    a. Treadmill Myoview 12/2015: Exercised for 8 minutes unable to achieve target heart rate, Lexiscan without significant ischemia, EF 55-65%   Diverticulitis    GERD (gastroesophageal reflux disease)    Inguinal hernia    Leukocytosis    Personal history of tobacco use, presenting hazards to health 07/20/2015    Past Surgical History:  Procedure Laterality Date   HERNIA REPAIR     bilateral inguinal     INGUINAL HERNIA REPAIR Bilateral 09/12/2016   Procedure: LAPAROSCOPIC BILATERAL INGUINAL HERNIA REPAIR;  Surgeon: Florene Glen, MD;  Location: [facility] ORS;  Service: General;  Laterality: Bilateral;   TONSILLECTOMY       Current Outpatient Medications  Medication Sig Dispense Refill   aspirin 81 MG chewable tablet Chew by mouth daily.     b complex vitamins capsule Take 1 capsule by mouth daily.     Multiple Vitamins-Minerals (MULTIVITAMIN WITH MINERALS) tablet Take 1 tablet by mouth daily.     Probiotic Product (PROBIOTIC DAILY PO) Take by mouth.     vitamin C (ASCORBIC ACID) 250 MG tablet Take 250 mg by mouth daily.     VITAMIN D PO Take by mouth.     No current facility-administered medications for this visit.    Allergies:   Oxycodone    Social History:  The patient  reports that he has been smoking cigarettes. He has a 50.00 pack-year smoking history. He has never used smokeless tobacco. He reports that he does not drink alcohol and does not use drugs.   Family History:  The patient's family history includes Heart disease in his father and sister; Hypertension in his father and mother; Lung cancer in his father; Stroke in his mother.    ROS:  Please see the history of present illness.   Otherwise, review of systems are positive for none.   All other systems are reviewed and negative.  PHYSICAL EXAM: VS:  BP 120/60 (BP Location: Left Arm, Patient Position: Sitting, Cuff Size: Normal)    Pulse 65    Ht 5' 11.5" (1.816 m)    Wt 138 lb 8 oz (62.8 kg)    SpO2 97%    BMI 19.05 kg/m  , BMI Body mass index is 19.05 kg/m. GEN: Well nourished, well developed, in no acute distress  HEENT: normal  Neck: no JVD, , or masses.  Faint left carotid bruit Cardiac: RRR; no murmurs, rubs, or gallops,no edema  Respiratory:  clear to auscultation bilaterally, normal work of breathing GI: soft, nontender, nondistended, + BS MS: no deformity or atrophy  Skin: warm and dry, no rash Neuro:   Strength and sensation are intact Psych: euthymic mood, full affect Distal pulses are normal.  EKG:  EKG is  ordered today. EKG showed normal sinus rhythm with no significant ST or T wave changes.   Recent Labs: 08/14/2020: ALT 12; BUN 14; Creatinine, Ser 1.31; Hemoglobin 14.7; Platelets 302; Potassium 4.4; Sodium 135    Lipid Panel    Component Value Date/Time   CHOL 143 11/24/2020 1435   TRIG 182 (H) 11/24/2020 1435   HDL 26 (L) 11/24/2020 1435   CHOLHDL 5.5 (H) 11/24/2020 1435   VLDL 25.2 10/04/2019 1200   LDLCALC 89 11/24/2020 1435   LDLDIRECT 106.0 04/23/2017 1343      Wt Readings from Last 3 Encounters:  12/29/20 138 lb 8 oz (62.8 kg)  11/24/20 145 lb 12.8 oz (66.1 kg)  09/19/20 143 lb (64.9 kg)       PAD Screen 12/12/2015  Previous PAD dx? No  Previous surgical procedure? No  Pain with walking? No  Feet/toe relief with dangling? No  Painful, non-healing ulcers? No  Extremities discolored? No      ASSESSMENT AND PLAN:  1.  Coronary atherosclerosis: This was noted on CT scan of the lungs.  Negative nuclear stress test was negative in 2018.  Continue low-dose aspirin.  I reviewed his most recent CT chest in January which showed stable calcifications.  2. Borderline hyperlipidemia: Most recent lipid profile showed an LDL of 82.  No strong indication for treatment with a statin at this point.  3.  Tobacco use I again discussed with him the importance of smoking cessation and provided him smoking cessation instructions..      Disposition:   FU with me in 12 months.   Signed,  Kathlyn Sacramento, MD  12/29/2020 9:15 AM    [place]

## 2021-02-27 ENCOUNTER — Encounter: Payer: Self-pay | Admitting: Family Medicine

## 2021-02-27 ENCOUNTER — Ambulatory Visit (INDEPENDENT_AMBULATORY_CARE_PROVIDER_SITE_OTHER): Payer: Medicare Other | Admitting: Family Medicine

## 2021-02-27 ENCOUNTER — Other Ambulatory Visit: Payer: Self-pay

## 2021-02-27 VITALS — BP 130/70 | HR 65 | Temp 98.3°F | Ht 71.0 in | Wt 139.4 lb

## 2021-02-27 DIAGNOSIS — R5383 Other fatigue: Secondary | ICD-10-CM

## 2021-02-27 DIAGNOSIS — N401 Enlarged prostate with lower urinary tract symptoms: Secondary | ICD-10-CM

## 2021-02-27 DIAGNOSIS — R3916 Straining to void: Secondary | ICD-10-CM | POA: Diagnosis not present

## 2021-02-27 DIAGNOSIS — R7303 Prediabetes: Secondary | ICD-10-CM | POA: Diagnosis not present

## 2021-02-27 DIAGNOSIS — Z125 Encounter for screening for malignant neoplasm of prostate: Secondary | ICD-10-CM | POA: Diagnosis not present

## 2021-02-27 DIAGNOSIS — R0982 Postnasal drip: Secondary | ICD-10-CM | POA: Diagnosis not present

## 2021-02-27 DIAGNOSIS — R1030 Lower abdominal pain, unspecified: Secondary | ICD-10-CM

## 2021-02-27 LAB — COMPREHENSIVE METABOLIC PANEL
ALT: 10 U/L (ref 0–53)
AST: 11 U/L (ref 0–37)
Albumin: 4 g/dL (ref 3.5–5.2)
Alkaline Phosphatase: 68 U/L (ref 39–117)
BUN: 14 mg/dL (ref 6–23)
CO2: 31 mEq/L (ref 19–32)
Calcium: 9.3 mg/dL (ref 8.4–10.5)
Chloride: 103 mEq/L (ref 96–112)
Creatinine, Ser: 1.31 mg/dL (ref 0.40–1.50)
GFR: 53 mL/min — ABNORMAL LOW (ref >60.00)
Glucose, Bld: 90 mg/dL (ref 70–99)
Potassium: 4.2 mEq/L (ref 3.5–5.1)
Sodium: 138 mEq/L (ref 135–145)
Total Bilirubin: 0.4 mg/dL (ref 0.2–1.2)
Total Protein: 6.9 g/dL (ref 6.0–8.3)

## 2021-02-27 LAB — CBC WITH DIFFERENTIAL/PLATELET
Basophils Absolute: 0.1 10*3/uL (ref 0.0–0.1)
Basophils Relative: 1.1 % (ref 0.0–3.0)
Eosinophils Absolute: 0.2 10*3/uL (ref 0.0–0.7)
Eosinophils Relative: 1.4 % (ref 0.0–5.0)
HCT: 42.1 % (ref 39.0–52.0)
Hemoglobin: 14.1 g/dL (ref 13.0–17.0)
Lymphocytes Relative: 15 % (ref 12.0–46.0)
Lymphs Abs: 1.8 10*3/uL (ref 0.7–4.0)
MCHC: 33.5 g/dL (ref 30.0–36.0)
MCV: 90.6 fl (ref 78.0–100.0)
Monocytes Absolute: 1.4 10*3/uL — ABNORMAL HIGH (ref 0.1–1.0)
Monocytes Relative: 11.8 % (ref 3.0–12.0)
Neutro Abs: 8.4 10*3/uL — ABNORMAL HIGH (ref 1.4–7.7)
Neutrophils Relative %: 70.7 % (ref 43.0–77.0)
Platelets: 359 10*3/uL (ref 150.0–400.0)
RBC: 4.64 Mil/uL (ref 4.22–5.81)
RDW: 13.6 % (ref 11.5–15.5)
WBC: 11.9 10*3/uL — ABNORMAL HIGH (ref 4.0–10.5)

## 2021-02-27 LAB — SEDIMENTATION RATE: Sed Rate: 18 mm/hr (ref 0–20)

## 2021-02-27 LAB — HEMOGLOBIN A1C: Hgb A1c MFr Bld: 6.1 % (ref 4.6–6.5)

## 2021-02-27 LAB — PSA, MEDICARE: PSA: 0.82 ng/ml (ref 0.10–4.00)

## 2021-02-27 LAB — TSH: TSH: 1.76 u[IU]/mL (ref 0.35–5.50)

## 2021-02-27 MED ORDER — LORATADINE 10 MG PO TABS: 10.0000 mg | ORAL_TABLET | Freq: Every day | ORAL | 11 refills | Status: DC

## 2021-02-27 NOTE — Assessment & Plan Note (Signed)
Certainly this could be contributing to his urinary issues.  We are obtaining a PSA today for prostate cancer screening.  We will see what work-up of his other issues brings and then consider treatment for this

## 2021-02-27 NOTE — Progress Notes (Signed)
[name], MD Phone: [phone]  Christian Hebert is a 76 y.o. male who presents today for follow-up.  Lower abdominal discomfort: Patient notes this did improve with the antibiotics when we treated him for diverticulitis previously.  This was empiric treatment as he did not have any imaging at that time.  Please see his prior note for that documentation.  He notes he does continue to have intermittent discomfort in his lower abdominal/pelvic area.  He notes he will have normal stools for a few days then have frequent loose bowel movements.  No blood in his stool.  No fevers.  His stool is brown.  He does get quite a bit of nausea if he lays down and feels as though he has gas buildup.  In the past he has had a lot of indigestion though he has cut down on food triggers and that has seemed to make a difference.  He reports feeling tired most of the time.  He reports having lost weight.  He has changed his diet fairly significantly over the last 6 months.  He used to eat lots of fried foods though he has cut that down.  He has not eating much bread.  He has cut down on milk intake.  He has been eating lots of vegetables and chicken.  He has not been exercising much.  He additionally reports some straining with urination and starting and stopping with urination.  Postnasal drip: This is an intermittent chronic issue.  Has been going on for 3 to 4 months since he had a cold.  He notes he does sneeze particularly when there is a change in temperature.  He does not blow anything out of his nose.  No history of allergies.  He is not on any allergy medication.  Social History   Tobacco Use  Smoking Status Some Days   Packs/day: 1.00   Years: 50.00   Pack years: 50.00   Types: Cigarettes   Last attempt to quit: 01/08/2016   Years since quitting: 5.1  Smokeless Tobacco Never    Current Outpatient Medications on File Prior to Visit  Medication Sig Dispense Refill   aspirin 81 MG chewable tablet  Chew by mouth daily.     b complex vitamins capsule Take 1 capsule by mouth daily.     Multiple Vitamins-Minerals (MULTIVITAMIN WITH MINERALS) tablet Take 1 tablet by mouth daily.     Probiotic Product (PROBIOTIC DAILY PO) Take by mouth.     vitamin C (ASCORBIC ACID) 250 MG tablet Take 250 mg by mouth daily.     VITAMIN D PO Take by mouth.     No current facility-administered medications on file prior to visit.     ROS see history of present illness  Objective  Physical Exam Vitals:   02/27/21 1214  BP: 130/70  Pulse: 65  Temp: 98.3 F (36.8 C)  SpO2: 96%    BP Readings from Last 3 Encounters:  02/27/21 130/70  12/29/20 120/60  11/24/20 140/80   Wt Readings from Last 3 Encounters:  02/27/21 139 lb 6.4 oz (63.2 kg)  12/29/20 138 lb 8 oz (62.8 kg)  11/24/20 145 lb 12.8 oz (66.1 kg)    Physical Exam Constitutional:      General: He is not in acute distress.    Appearance: He is not diaphoretic.  Cardiovascular:     Rate and Rhythm: Normal rate and regular rhythm.     Heart sounds: Normal heart sounds.  Pulmonary:  Effort: Pulmonary effort is normal.     Breath sounds: Normal breath sounds.  Abdominal:     General: Bowel sounds are normal. There is no distension.     Palpations: Abdomen is soft. There is no mass.     Tenderness: There is no guarding.    Skin:    General: Skin is warm and dry.  Neurological:     Mental Status: He is alert.     Assessment/Plan: Please see individual problem list.  Problem List Items Addressed This Visit     BPH (benign prostatic hyperplasia)    Certainly this could be contributing to his urinary issues.  We are obtaining a PSA today for prostate cancer screening.  We will see what work-up of his other issues brings and then consider treatment for this      Lower abdominal pain - Primary    This is an ongoing issue.  At this point he needs imaging to evaluate for an intra-abdominal process that could be contributing to  his symptoms.  We will additionally get lab work to evaluate for cause of this and his fatigue.  I discussed if the CT scan was negative we would likely have him see GI to consider colonoscopy and upper endoscopy given his reported symptoms.  Discussed if he developed any increasing abdominal pain or any new symptoms he would need to be reevaluated.  He will follow-up in about a month for this issue.      Relevant Orders   Comp Met (CMET)   CBC w/Diff   TSH   Sedimentation rate   CT Abdomen Pelvis W Contrast   Post-nasal drainage    Ongoing issue.  He could have allergies.  We will trial Claritin.  If not beneficial he will let us know.      Relevant Medications   loratadine (CLARITIN) 10 MG tablet   Prediabetes   Relevant Orders   HgB A1c   Other Visit Diagnoses     Other fatigue       Relevant Orders   Comp Met (CMET)   CBC w/Diff   TSH   Sedimentation rate   Prostate cancer screening       Relevant Orders   PSA, Medicare ( [place] only)       Return in about 1 month (around 03/27/2021) for abdominal pain follow-up.  This visit occurred during the SARS-CoV-2 public health emergency.  Safety protocols were in place, including screening questions prior to the visit, additional usage of staff PPE, and extensive cleaning of exam room while observing appropriate contact time as indicated for disinfecting solutions.   I have spent 33 minutes in the care of this patient regarding History taking, documentation, completion of exam, discussion of plan, placing orders.   [name], MD [place]

## 2021-02-27 NOTE — Patient Instructions (Signed)
Nice to see you. We are going to get lab work today and contact you with the results. Somebody should contact you to schedule a CT scan of your abdomen. Please try the Claritin to see if that helps with your drainage.  If you develop increasing abdominal pain or worsening symptoms or new symptoms please get evaluated again.

## 2021-02-27 NOTE — Assessment & Plan Note (Signed)
Ongoing issue.  He could have allergies.  We will trial Claritin.  If not beneficial he will let us know.

## 2021-02-27 NOTE — Assessment & Plan Note (Signed)
This is an ongoing issue.  At this point he needs imaging to evaluate for an intra-abdominal process that could be contributing to his symptoms.  We will additionally get lab work to evaluate for cause of this and his fatigue.  I discussed if the CT scan was negative we would likely have him see GI to consider colonoscopy and upper endoscopy given his reported symptoms.  Discussed if he developed any increasing abdominal pain or any new symptoms he would need to be reevaluated.  He will follow-up in about a month for this issue.

## 2021-02-28 ENCOUNTER — Ambulatory Visit
Admission: RE | Admit: 2021-02-28 | Discharge: 2021-02-28 | Disposition: A | Discharge status: Home / Self Care | Payer: Medicare Other | Source: Ambulatory Visit | Attending: Family Medicine | Admitting: Family Medicine

## 2021-02-28 DIAGNOSIS — R1030 Lower abdominal pain, unspecified: Secondary | ICD-10-CM | POA: Diagnosis not present

## 2021-02-28 DIAGNOSIS — I700 Atherosclerosis of aorta: Secondary | ICD-10-CM | POA: Diagnosis not present

## 2021-02-28 DIAGNOSIS — R109 Unspecified abdominal pain: Secondary | ICD-10-CM | POA: Diagnosis not present

## 2021-02-28 MED ORDER — IOHEXOL 300 MG/ML  SOLN
100.0000 mL | Freq: Once | INTRAMUSCULAR | Status: AC | PRN
Start: 2021-02-28 — End: 2021-02-28
  Administered 2021-02-28: 100 mL via INTRAVENOUS

## 2021-03-01 ENCOUNTER — Encounter: Payer: Self-pay | Admitting: Family Medicine

## 2021-03-01 DIAGNOSIS — I714 Abdominal aortic aneurysm, without rupture, unspecified: Secondary | ICD-10-CM | POA: Insufficient documentation

## 2021-03-02 ENCOUNTER — Telehealth: Payer: Self-pay | Admitting: Family Medicine

## 2021-03-02 MED ORDER — CIPROFLOXACIN HCL 500 MG PO TABS: 500.0000 mg | ORAL_TABLET | Freq: Two times a day (BID) | ORAL | 0 refills | Status: DC

## 2021-03-02 MED ORDER — METRONIDAZOLE 500 MG PO TABS: 500.0000 mg | ORAL_TABLET | Freq: Three times a day (TID) | ORAL | 0 refills | Status: DC

## 2021-03-02 NOTE — Telephone Encounter (Signed)
I called and got no answer or vm to inform patient that provider sent in medication.  Christian Hebert,cma

## 2021-03-02 NOTE — Telephone Encounter (Signed)
I sent in Flagyl and ciprofloxacin for him.  Please let him know they should be available at his pharmacy.

## 2021-03-02 NOTE — Telephone Encounter (Signed)
Pt is calling about antibiotics being called in at [facility] making sure its not called into the wrong pharmacy. Pt request to be called

## 2021-03-02 NOTE — Telephone Encounter (Signed)
I called the patient back and  when I gave him his ct results in the message the provider was going to send in a different antibiotic for the patient to try and he has not received it yet, it has not been written can you please read the result note and send it to [facility] for the patient.  Christian Hebert,cma

## 2021-03-07 ENCOUNTER — Other Ambulatory Visit: Payer: Self-pay | Admitting: *Deleted

## 2021-03-07 DIAGNOSIS — Z87891 Personal history of nicotine dependence: Secondary | ICD-10-CM

## 2021-03-07 DIAGNOSIS — F17210 Nicotine dependence, cigarettes, uncomplicated: Secondary | ICD-10-CM

## 2021-03-27 ENCOUNTER — Other Ambulatory Visit: Payer: Self-pay

## 2021-03-27 ENCOUNTER — Ambulatory Visit
Admission: RE | Admit: 2021-03-27 | Discharge: 2021-03-27 | Disposition: A | Discharge status: Home / Self Care | Payer: Medicare Other | Source: Ambulatory Visit | Attending: Acute Care | Admitting: Acute Care

## 2021-03-27 DIAGNOSIS — F17210 Nicotine dependence, cigarettes, uncomplicated: Secondary | ICD-10-CM | POA: Diagnosis not present

## 2021-03-27 DIAGNOSIS — Z87891 Personal history of nicotine dependence: Secondary | ICD-10-CM | POA: Diagnosis not present

## 2021-03-28 ENCOUNTER — Other Ambulatory Visit: Payer: Self-pay

## 2021-03-28 ENCOUNTER — Ambulatory Visit (INDEPENDENT_AMBULATORY_CARE_PROVIDER_SITE_OTHER): Payer: Medicare Other | Admitting: Family Medicine

## 2021-03-28 ENCOUNTER — Encounter: Payer: Self-pay | Admitting: Family Medicine

## 2021-03-28 VITALS — BP 120/70 | HR 55 | Temp 98.2°F | Ht 71.0 in | Wt 139.4 lb

## 2021-03-28 DIAGNOSIS — R935 Abnormal findings on diagnostic imaging of other abdominal regions, including retroperitoneum: Secondary | ICD-10-CM | POA: Diagnosis not present

## 2021-03-28 DIAGNOSIS — R1030 Lower abdominal pain, unspecified: Secondary | ICD-10-CM

## 2021-03-28 DIAGNOSIS — I7143 Infrarenal abdominal aortic aneurysm, without rupture: Secondary | ICD-10-CM | POA: Diagnosis not present

## 2021-03-28 NOTE — Assessment & Plan Note (Addendum)
Improved at this point.  Unclear if this was diverticulitis or not.  CT imaging was somewhat concerning for that.  Given his improvement at this time he does not need further antibiotics.  We will refer to GI for colonoscopy.  Discussed this is to evaluate for an underlying lesion such as a polyp or a cancer that could be contributing. Weight has been stable. He will monitor and if it trends down he will let us know.  ?

## 2021-03-28 NOTE — Patient Instructions (Signed)
Nice to see you. ?I will refer you to GI for a colonoscopy.  ?

## 2021-03-28 NOTE — Progress Notes (Signed)
?[name], MD ?Phone: [phone] ? ?Christian Hebert is a 76 y.o. male who presents today for f/u. ? ?Left lower quadrant pain: Patient reports this has improved.  He was only able to tolerate ciprofloxacin and Flagyl for 3 to 4 days as it irritated his stomach.  He notes a recurrent history of diverticulitis.  Notes his weight has stabilized.  His CT imaging did also find a AAA and a ultrasound every 3 years was recommended.  He had his CT lung cancer screening completed yesterday.  His CT imaging also revealed likely hepatic cysts and a renal cyst. ? ?Social History  ? ?Tobacco Use  ?Smoking Status Some Days  ? Packs/day: 1.00  ? Years: 50.00  ? Pack years: 50.00  ? Types: Cigarettes  ? Last attempt to quit: 01/08/2016  ? Years since quitting: 5.2  ?Smokeless Tobacco Never  ? ? ?Current Outpatient Medications on File Prior to Visit  ?Medication Sig Dispense Refill  ? aspirin 81 MG chewable tablet Chew by mouth daily.    ? b complex vitamins capsule Take 1 capsule by mouth daily.    ? loratadine (CLARITIN) 10 MG tablet Take 1 tablet (10 mg total) by mouth daily. 30 tablet 11  ? Multiple Vitamins-Minerals (MULTIVITAMIN WITH MINERALS) tablet Take 1 tablet by mouth daily.    ? Probiotic Product (PROBIOTIC DAILY PO) Take by mouth.    ? vitamin C (ASCORBIC ACID) 250 MG tablet Take 250 mg by mouth daily.    ? VITAMIN D PO Take by mouth.    ? ?No current facility-administered medications on file prior to visit.  ? ? ? ?ROS see history of present illness ? ?Objective ? ?Physical Exam ?Vitals:  ? 03/28/21 1346  ?BP: 120/70  ?Pulse: (!) 55  ?Temp: 98.2 ?F (36.8 ?C)  ?SpO2: 98%  ? ? ?BP Readings from Last 3 Encounters:  ?03/28/21 120/70  ?02/27/21 130/70  ?12/29/20 120/60  ? ?Wt Readings from Last 3 Encounters:  ?03/28/21 139 lb 6.4 oz (63.2 kg)  ?03/27/21 142 lb (64.4 kg)  ?02/27/21 139 lb 6.4 oz (63.2 kg)  ? ? ?Physical Exam ?Constitutional:   ?   General: He is not in acute distress. ?   Appearance: He is not  diaphoretic.  ?Pulmonary:  ?   Effort: Pulmonary effort is normal.  ?Abdominal:  ?   General: Bowel sounds are normal. There is no distension.  ?   Palpations: Abdomen is soft. There is no mass.  ?   Tenderness: There is no abdominal tenderness.  ?Skin: ?   General: Skin is warm and dry.  ?Neurological:  ?   Mental Status: He is alert.  ? ? ? ?Assessment/Plan: Please see individual problem list. ? ?Problem List Items Addressed This Visit   ? ? AAA (abdominal aortic aneurysm)  ?  Patient will be due for a follow-up ultrasound in February 2026.  He was counseled on seeking medical attention for any abdominal discomfort given risk of rupture. ?  ?  ? Lower abdominal pain  ?  Improved at this point.  Unclear if this was diverticulitis or not.  CT imaging was somewhat concerning for that.  Given his improvement at this time he does not need further antibiotics.  We will refer to GI for colonoscopy.  Discussed this is to evaluate for an underlying lesion such as a polyp or a cancer that could be contributing. Weight has been stable. He will monitor and if it trends down he will  let us know.  ?  ?  ? ?Other Visit Diagnoses   ? ? Abnormal CT of the abdomen    -  Primary  ? Relevant Orders  ? Ambulatory referral to Gastroenterology  ? ?  ? ?PSA acceptable. Awaiting lung cancer screening CT scan. Refer to GI for colon cancer screening.  ? ?Return in about 3 months (around 06/28/2021) for Follow-up abdominal pain/weight. ? ?This visit occurred during the SARS-CoV-2 public health emergency.  Safety protocols were in place, including screening questions prior to the visit, additional usage of staff PPE, and extensive cleaning of exam room while observing appropriate contact time as indicated for disinfecting solutions.  ? ? ?[name], MD ?[place] ? ?

## 2021-03-28 NOTE — Assessment & Plan Note (Addendum)
Patient will be due for a follow-up ultrasound in February 2026.  He was counseled on seeking medical attention for any abdominal discomfort given risk of rupture. ?

## 2021-03-29 ENCOUNTER — Telehealth: Payer: Self-pay

## 2021-03-29 ENCOUNTER — Other Ambulatory Visit: Payer: Self-pay

## 2021-03-29 DIAGNOSIS — F17210 Nicotine dependence, cigarettes, uncomplicated: Secondary | ICD-10-CM

## 2021-03-29 DIAGNOSIS — Z87891 Personal history of nicotine dependence: Secondary | ICD-10-CM

## 2021-03-29 NOTE — Telephone Encounter (Signed)
Scheduled for 07/23/2021 ?

## 2021-04-30 ENCOUNTER — Other Ambulatory Visit: Payer: Self-pay

## 2021-04-30 ENCOUNTER — Inpatient Hospital Stay
Admission: EM | Admit: 2021-04-30 | Discharge: 2021-05-03 | DRG: 386 | Disposition: A | Discharge status: Home / Self Care | Payer: Medicare Other | Attending: Internal Medicine | Admitting: Internal Medicine

## 2021-04-30 ENCOUNTER — Encounter: Payer: Self-pay | Admitting: Emergency Medicine

## 2021-04-30 DIAGNOSIS — F17210 Nicotine dependence, cigarettes, uncomplicated: Secondary | ICD-10-CM | POA: Diagnosis present

## 2021-04-30 DIAGNOSIS — N1831 Chronic kidney disease, stage 3a: Secondary | ICD-10-CM | POA: Diagnosis present

## 2021-04-30 DIAGNOSIS — E785 Hyperlipidemia, unspecified: Secondary | ICD-10-CM | POA: Diagnosis not present

## 2021-04-30 DIAGNOSIS — K5730 Diverticulosis of large intestine without perforation or abscess without bleeding: Secondary | ICD-10-CM | POA: Diagnosis present

## 2021-04-30 DIAGNOSIS — R9431 Abnormal electrocardiogram [ECG] [EKG]: Secondary | ICD-10-CM | POA: Diagnosis not present

## 2021-04-30 DIAGNOSIS — K222 Esophageal obstruction: Secondary | ICD-10-CM | POA: Diagnosis present

## 2021-04-30 DIAGNOSIS — R001 Bradycardia, unspecified: Secondary | ICD-10-CM | POA: Diagnosis present

## 2021-04-30 DIAGNOSIS — Z8249 Family history of ischemic heart disease and other diseases of the circulatory system: Secondary | ICD-10-CM | POA: Diagnosis not present

## 2021-04-30 DIAGNOSIS — K909 Intestinal malabsorption, unspecified: Secondary | ICD-10-CM | POA: Diagnosis not present

## 2021-04-30 DIAGNOSIS — K449 Diaphragmatic hernia without obstruction or gangrene: Secondary | ICD-10-CM | POA: Diagnosis present

## 2021-04-30 DIAGNOSIS — K120 Recurrent oral aphthae: Secondary | ICD-10-CM | POA: Diagnosis present

## 2021-04-30 DIAGNOSIS — R197 Diarrhea, unspecified: Secondary | ICD-10-CM

## 2021-04-30 DIAGNOSIS — Z885 Allergy status to narcotic agent status: Secondary | ICD-10-CM

## 2021-04-30 DIAGNOSIS — D500 Iron deficiency anemia secondary to blood loss (chronic): Secondary | ICD-10-CM | POA: Diagnosis not present

## 2021-04-30 DIAGNOSIS — K529 Noninfective gastroenteritis and colitis, unspecified: Secondary | ICD-10-CM | POA: Diagnosis not present

## 2021-04-30 DIAGNOSIS — K317 Polyp of stomach and duodenum: Secondary | ICD-10-CM | POA: Diagnosis not present

## 2021-04-30 DIAGNOSIS — I2510 Atherosclerotic heart disease of native coronary artery without angina pectoris: Secondary | ICD-10-CM | POA: Diagnosis present

## 2021-04-30 DIAGNOSIS — Z79899 Other long term (current) drug therapy: Secondary | ICD-10-CM

## 2021-04-30 DIAGNOSIS — Z823 Family history of stroke: Secondary | ICD-10-CM

## 2021-04-30 DIAGNOSIS — I739 Peripheral vascular disease, unspecified: Secondary | ICD-10-CM | POA: Diagnosis present

## 2021-04-30 DIAGNOSIS — D72820 Lymphocytosis (symptomatic): Secondary | ICD-10-CM | POA: Diagnosis present

## 2021-04-30 DIAGNOSIS — J439 Emphysema, unspecified: Secondary | ICD-10-CM | POA: Diagnosis not present

## 2021-04-30 DIAGNOSIS — K219 Gastro-esophageal reflux disease without esophagitis: Secondary | ICD-10-CM | POA: Diagnosis present

## 2021-04-30 DIAGNOSIS — D62 Acute posthemorrhagic anemia: Secondary | ICD-10-CM | POA: Diagnosis present

## 2021-04-30 DIAGNOSIS — K922 Gastrointestinal hemorrhage, unspecified: Secondary | ICD-10-CM | POA: Diagnosis not present

## 2021-04-30 DIAGNOSIS — K2970 Gastritis, unspecified, without bleeding: Secondary | ICD-10-CM | POA: Diagnosis present

## 2021-04-30 DIAGNOSIS — D72821 Monocytosis (symptomatic): Secondary | ICD-10-CM | POA: Diagnosis present

## 2021-04-30 DIAGNOSIS — Z681 Body mass index (BMI) 19 or less, adult: Secondary | ICD-10-CM | POA: Diagnosis not present

## 2021-04-30 DIAGNOSIS — J449 Chronic obstructive pulmonary disease, unspecified: Secondary | ICD-10-CM | POA: Diagnosis present

## 2021-04-30 DIAGNOSIS — N183 Chronic kidney disease, stage 3 unspecified: Secondary | ICD-10-CM | POA: Diagnosis not present

## 2021-04-30 DIAGNOSIS — Z9049 Acquired absence of other specified parts of digestive tract: Secondary | ICD-10-CM | POA: Diagnosis not present

## 2021-04-30 DIAGNOSIS — R634 Abnormal weight loss: Secondary | ICD-10-CM | POA: Diagnosis not present

## 2021-04-30 DIAGNOSIS — K51911 Ulcerative colitis, unspecified with rectal bleeding: Secondary | ICD-10-CM | POA: Diagnosis not present

## 2021-04-30 DIAGNOSIS — Z801 Family history of malignant neoplasm of trachea, bronchus and lung: Secondary | ICD-10-CM

## 2021-04-30 DIAGNOSIS — Z7982 Long term (current) use of aspirin: Secondary | ICD-10-CM

## 2021-04-30 DIAGNOSIS — K5180 Other ulcerative colitis without complications: Principal | ICD-10-CM | POA: Diagnosis present

## 2021-04-30 NOTE — ED Triage Notes (Signed)
Pt to triage via w/c; pt c/o lower abd pain with rectal bleeding tonight; denies hx of same ?

## 2021-05-01 ENCOUNTER — Encounter: Payer: Self-pay | Admitting: Family Medicine

## 2021-05-01 ENCOUNTER — Emergency Department: Payer: Medicare Other

## 2021-05-01 DIAGNOSIS — J439 Emphysema, unspecified: Secondary | ICD-10-CM | POA: Diagnosis not present

## 2021-05-01 DIAGNOSIS — K120 Recurrent oral aphthae: Secondary | ICD-10-CM | POA: Diagnosis present

## 2021-05-01 DIAGNOSIS — D72820 Lymphocytosis (symptomatic): Secondary | ICD-10-CM | POA: Diagnosis present

## 2021-05-01 DIAGNOSIS — K317 Polyp of stomach and duodenum: Secondary | ICD-10-CM | POA: Diagnosis present

## 2021-05-01 DIAGNOSIS — K5180 Other ulcerative colitis without complications: Secondary | ICD-10-CM | POA: Diagnosis present

## 2021-05-01 DIAGNOSIS — K909 Intestinal malabsorption, unspecified: Secondary | ICD-10-CM

## 2021-05-01 DIAGNOSIS — D72821 Monocytosis (symptomatic): Secondary | ICD-10-CM | POA: Diagnosis present

## 2021-05-01 DIAGNOSIS — J449 Chronic obstructive pulmonary disease, unspecified: Secondary | ICD-10-CM | POA: Diagnosis present

## 2021-05-01 DIAGNOSIS — K449 Diaphragmatic hernia without obstruction or gangrene: Secondary | ICD-10-CM | POA: Diagnosis present

## 2021-05-01 DIAGNOSIS — K2970 Gastritis, unspecified, without bleeding: Secondary | ICD-10-CM | POA: Diagnosis present

## 2021-05-01 DIAGNOSIS — D500 Iron deficiency anemia secondary to blood loss (chronic): Secondary | ICD-10-CM | POA: Diagnosis not present

## 2021-05-01 DIAGNOSIS — K922 Gastrointestinal hemorrhage, unspecified: Secondary | ICD-10-CM | POA: Diagnosis not present

## 2021-05-01 DIAGNOSIS — I739 Peripheral vascular disease, unspecified: Secondary | ICD-10-CM | POA: Diagnosis present

## 2021-05-01 DIAGNOSIS — K529 Noninfective gastroenteritis and colitis, unspecified: Secondary | ICD-10-CM | POA: Diagnosis not present

## 2021-05-01 DIAGNOSIS — N183 Chronic kidney disease, stage 3 unspecified: Secondary | ICD-10-CM | POA: Diagnosis not present

## 2021-05-01 DIAGNOSIS — R634 Abnormal weight loss: Secondary | ICD-10-CM | POA: Diagnosis present

## 2021-05-01 DIAGNOSIS — Z9049 Acquired absence of other specified parts of digestive tract: Secondary | ICD-10-CM | POA: Diagnosis not present

## 2021-05-01 DIAGNOSIS — K51911 Ulcerative colitis, unspecified with rectal bleeding: Secondary | ICD-10-CM | POA: Diagnosis not present

## 2021-05-01 DIAGNOSIS — K5730 Diverticulosis of large intestine without perforation or abscess without bleeding: Secondary | ICD-10-CM | POA: Diagnosis present

## 2021-05-01 DIAGNOSIS — F17210 Nicotine dependence, cigarettes, uncomplicated: Secondary | ICD-10-CM | POA: Diagnosis present

## 2021-05-01 DIAGNOSIS — D62 Acute posthemorrhagic anemia: Secondary | ICD-10-CM | POA: Diagnosis present

## 2021-05-01 DIAGNOSIS — Z801 Family history of malignant neoplasm of trachea, bronchus and lung: Secondary | ICD-10-CM | POA: Diagnosis not present

## 2021-05-01 DIAGNOSIS — R197 Diarrhea, unspecified: Secondary | ICD-10-CM | POA: Diagnosis not present

## 2021-05-01 DIAGNOSIS — Z885 Allergy status to narcotic agent status: Secondary | ICD-10-CM | POA: Diagnosis not present

## 2021-05-01 DIAGNOSIS — N1831 Chronic kidney disease, stage 3a: Secondary | ICD-10-CM | POA: Diagnosis present

## 2021-05-01 DIAGNOSIS — K222 Esophageal obstruction: Secondary | ICD-10-CM | POA: Diagnosis present

## 2021-05-01 DIAGNOSIS — R001 Bradycardia, unspecified: Secondary | ICD-10-CM | POA: Diagnosis present

## 2021-05-01 DIAGNOSIS — K219 Gastro-esophageal reflux disease without esophagitis: Secondary | ICD-10-CM | POA: Diagnosis present

## 2021-05-01 DIAGNOSIS — Z823 Family history of stroke: Secondary | ICD-10-CM | POA: Diagnosis not present

## 2021-05-01 DIAGNOSIS — Z681 Body mass index (BMI) 19 or less, adult: Secondary | ICD-10-CM | POA: Diagnosis not present

## 2021-05-01 DIAGNOSIS — I2510 Atherosclerotic heart disease of native coronary artery without angina pectoris: Secondary | ICD-10-CM | POA: Diagnosis present

## 2021-05-01 DIAGNOSIS — Z8249 Family history of ischemic heart disease and other diseases of the circulatory system: Secondary | ICD-10-CM | POA: Diagnosis not present

## 2021-05-01 DIAGNOSIS — E785 Hyperlipidemia, unspecified: Secondary | ICD-10-CM | POA: Diagnosis not present

## 2021-05-01 LAB — CBC WITH DIFFERENTIAL/PLATELET
Abs Immature Granulocytes: 0.08 10*3/uL — ABNORMAL HIGH (ref 0.00–0.07)
Basophils Absolute: 0.1 10*3/uL (ref 0.0–0.1)
Basophils Relative: 1 %
Eosinophils Absolute: 0.2 10*3/uL (ref 0.0–0.5)
Eosinophils Relative: 2 %
HCT: 38 % — ABNORMAL LOW (ref 39.0–52.0)
Hemoglobin: 12.2 g/dL — ABNORMAL LOW (ref 13.0–17.0)
Immature Granulocytes: 1 %
Lymphocytes Relative: 14 %
Lymphs Abs: 1.9 10*3/uL (ref 0.7–4.0)
MCH: 29.6 pg (ref 26.0–34.0)
MCHC: 32.1 g/dL (ref 30.0–36.0)
MCV: 92.2 fL (ref 80.0–100.0)
Monocytes Absolute: 1.5 10*3/uL — ABNORMAL HIGH (ref 0.1–1.0)
Monocytes Relative: 11 %
Neutro Abs: 9.9 10*3/uL — ABNORMAL HIGH (ref 1.7–7.7)
Neutrophils Relative %: 71 %
Platelets: 322 10*3/uL (ref 150–400)
RBC: 4.12 MIL/uL — ABNORMAL LOW (ref 4.22–5.81)
RDW: 13 % (ref 11.5–15.5)
WBC: 13.6 10*3/uL — ABNORMAL HIGH (ref 4.0–10.5)
nRBC: 0 % (ref 0.0–0.2)

## 2021-05-01 LAB — COMPREHENSIVE METABOLIC PANEL
ALT: 11 U/L (ref 0–44)
AST: 14 U/L — ABNORMAL LOW (ref 15–41)
Albumin: 3.2 g/dL — ABNORMAL LOW (ref 3.5–5.0)
Alkaline Phosphatase: 50 U/L (ref 38–126)
Anion gap: 5 (ref 5–15)
BUN: 24 mg/dL — ABNORMAL HIGH (ref 8–23)
CO2: 26 mmol/L (ref 22–32)
Calcium: 8.5 mg/dL — ABNORMAL LOW (ref 8.9–10.3)
Chloride: 106 mmol/L (ref 98–111)
Creatinine, Ser: 1.4 mg/dL — ABNORMAL HIGH (ref 0.61–1.24)
GFR, Estimated: 52 mL/min — ABNORMAL LOW (ref >60)
Glucose, Bld: 106 mg/dL — ABNORMAL HIGH (ref 70–99)
Potassium: 3.9 mmol/L (ref 3.5–5.1)
Sodium: 137 mmol/L (ref 135–145)
Total Bilirubin: 0.5 mg/dL (ref 0.3–1.2)
Total Protein: 6.5 g/dL (ref 6.5–8.1)

## 2021-05-01 LAB — HEMOGLOBIN AND HEMATOCRIT, BLOOD
HCT: 35.6 % — ABNORMAL LOW (ref 39.0–52.0)
HCT: 38.3 % — ABNORMAL LOW (ref 39.0–52.0)
Hemoglobin: 11.6 g/dL — ABNORMAL LOW (ref 13.0–17.0)
Hemoglobin: 12.4 g/dL — ABNORMAL LOW (ref 13.0–17.0)

## 2021-05-01 LAB — TYPE AND SCREEN
ABO/RH(D): A POS
Antibody Screen: NEGATIVE

## 2021-05-01 LAB — CBC
HCT: 39 % (ref 39.0–52.0)
Hemoglobin: 12.6 g/dL — ABNORMAL LOW (ref 13.0–17.0)
MCH: 29.9 pg (ref 26.0–34.0)
MCHC: 32.3 g/dL (ref 30.0–36.0)
MCV: 92.6 fL (ref 80.0–100.0)
Platelets: 341 10*3/uL (ref 150–400)
RBC: 4.21 MIL/uL — ABNORMAL LOW (ref 4.22–5.81)
RDW: 12.9 % (ref 11.5–15.5)
WBC: 13.2 10*3/uL — ABNORMAL HIGH (ref 4.0–10.5)
nRBC: 0 % (ref 0.0–0.2)

## 2021-05-01 LAB — BASIC METABOLIC PANEL
Anion gap: 4 — ABNORMAL LOW (ref 5–15)
BUN: 21 mg/dL (ref 8–23)
CO2: 22 mmol/L (ref 22–32)
Calcium: 8.2 mg/dL — ABNORMAL LOW (ref 8.9–10.3)
Chloride: 109 mmol/L (ref 98–111)
Creatinine, Ser: 1.35 mg/dL — ABNORMAL HIGH (ref 0.61–1.24)
GFR, Estimated: 54 mL/min — ABNORMAL LOW (ref >60)
Glucose, Bld: 104 mg/dL — ABNORMAL HIGH (ref 70–99)
Potassium: 4.4 mmol/L (ref 3.5–5.1)
Sodium: 135 mmol/L (ref 135–145)

## 2021-05-01 LAB — PROTIME-INR
INR: 1.2 (ref 0.8–1.2)
Prothrombin Time: 14.6 seconds (ref 11.4–15.2)

## 2021-05-01 LAB — LIPASE, BLOOD: Lipase: 54 U/L — ABNORMAL HIGH (ref 11–51)

## 2021-05-01 MED ORDER — ACETAMINOPHEN 650 MG RE SUPP: 650.0000 mg | Freq: Four times a day (QID) | RECTAL | Status: DC | PRN

## 2021-05-01 MED ORDER — ONDANSETRON HCL 4 MG PO TABS: 4.0000 mg | ORAL_TABLET | Freq: Four times a day (QID) | ORAL | Status: DC | PRN

## 2021-05-01 MED ORDER — ACETAMINOPHEN 325 MG PO TABS
650.0000 mg | ORAL_TABLET | Freq: Four times a day (QID) | ORAL | Status: DC | PRN
Start: 2021-05-01 — End: 2021-05-03

## 2021-05-01 MED ORDER — ACETAMINOPHEN 325 MG PO TABS: 650.0000 mg | ORAL_TABLET | Freq: Four times a day (QID) | ORAL | Status: DC | PRN

## 2021-05-01 MED ORDER — VITAMIN D 25 MCG (1000 UNIT) PO TABS
1000.0000 [IU] | ORAL_TABLET | Freq: Every day | ORAL | Status: DC
  Administered 2021-05-01 – 2021-05-03 (×2): 1000 [IU] via ORAL
  Filled 2021-05-01 (×2): qty 1

## 2021-05-01 MED ORDER — ADULT MULTIVITAMIN W/MINERALS CH
1.0000 | ORAL_TABLET | Freq: Every day | ORAL | Status: DC
  Administered 2021-05-01 – 2021-05-03 (×2): 1 via ORAL
  Filled 2021-05-01 (×2): qty 1

## 2021-05-01 MED ORDER — SODIUM CHLORIDE 0.9 % IV SOLN: INTRAVENOUS | Status: DC

## 2021-05-01 MED ORDER — IOHEXOL 350 MG/ML SOLN
100.0000 mL | Freq: Once | INTRAVENOUS | Status: AC | PRN
  Administered 2021-05-01: 75 mL via INTRAVENOUS

## 2021-05-01 MED ORDER — ONDANSETRON HCL 4 MG/2ML IJ SOLN
4.0000 mg | Freq: Once | INTRAMUSCULAR | Status: AC
  Administered 2021-05-01: 4 mg via INTRAVENOUS
  Filled 2021-05-01: qty 2

## 2021-05-01 MED ORDER — ASCORBIC ACID 500 MG PO TABS
250.0000 mg | ORAL_TABLET | Freq: Every day | ORAL | Status: DC
  Administered 2021-05-01 – 2021-05-03 (×2): 250 mg via ORAL
  Filled 2021-05-01 (×2): qty 1

## 2021-05-01 MED ORDER — MAGNESIUM HYDROXIDE 400 MG/5ML PO SUSP: 30.0000 mL | Freq: Every day | ORAL | Status: DC | PRN

## 2021-05-01 MED ORDER — ONDANSETRON HCL 4 MG/2ML IJ SOLN: 4.0000 mg | Freq: Four times a day (QID) | INTRAMUSCULAR | Status: DC | PRN

## 2021-05-01 MED ORDER — TRAZODONE HCL 50 MG PO TABS: 25.0000 mg | ORAL_TABLET | Freq: Every evening | ORAL | Status: DC | PRN

## 2021-05-01 MED ORDER — PEG 3350-KCL-NA BICARB-NACL 420 G PO SOLR
4000.0000 mL | Freq: Once | ORAL | Status: AC
Start: 2021-05-01 — End: 2021-05-01
  Administered 2021-05-01: 4000 mL via ORAL
  Filled 2021-05-01: qty 4000

## 2021-05-01 MED ORDER — LORATADINE 10 MG PO TABS
10.0000 mg | ORAL_TABLET | Freq: Every day | ORAL | Status: DC
  Filled 2021-05-01 (×2): qty 1

## 2021-05-01 MED ORDER — RENA-VITE PO TABS
1.0000 | ORAL_TABLET | Freq: Every day | ORAL | Status: DC
  Administered 2021-05-01: 1 via ORAL
  Filled 2021-05-01: qty 1

## 2021-05-01 NOTE — Progress Notes (Signed)
Provider Foust/NP notified of patient being bradycardic on tele. Patient axo4 with no symptoms showing. Other vitals WNL. Will continue to monitor patient. ?

## 2021-05-01 NOTE — Progress Notes (Signed)
?PROGRESS NOTE ? ? ? ?Christian Hebert  JTT:[id number] DOB: December 07, 1945 DOA: 04/30/2021 ?PCP: Leone Haven, MD  ? ? ?Brief Narrative:  ? ?Christian Hebert is a 76 year old male with past medical history significant for CKD stage IIIa, COPD, CAD, GERD, tobacco abuse who presented to [facility] ED on 4/24 with complaints of lower abdominal pain and questionable rectal bleeding.  Patient also reports significant diarrhea over the last 1-2 weeks.  Recently seen by PCP and prescribed an antibiotic for presumed diverticulitis.  Patient reports has had a colonoscopy greater than 8 years ago in [place] and has more recently completed Cologuard which were reported as normal.  No other specific complaints or concerns at this time.  No changes in medications, or sick contacts. ? ?In the ED, temperature 97.8 ?F, HR 61, RR 18, BP 93/63, SPO2 94% on room air.  WBC count 13.6, hemoglobin 12.2, platelets 322.  Sodium 137, potassium 3.9, chloride 106, CO2 26, glucose 106, BUN 24, creatinine 1.40.  CT angiogram abdomen/pelvis with sigmoid diverticulosis with area of wall thickening proximal/mid sigmoid colon similar to prior study could be reflective of muscular hypertrophy but mass/malignancy cannot be excluded.  Patient was started on IV fluids.  Hospital service consulted for further evaluation and management of diarrhea, possible lower GI bleed. ? ?Assessment & Plan: ?  ? ?Diarrhea ?Lower GI bleed. ? ?Sigmoid lesion ?Patient presenting to ED with abdominal pain, questionable rectal bleeding.  Reports previous colonoscopy greater than 8 years ago in [place] that was reported as normal and more recent Cologuard testing that was normal.  Patient recently prescribed antibiotics for presumed diverticulitis flare by his PCP.  CT abdomen/pelvis with sigmoid lesion noted. ?--Check C. difficile/GI PCR panel given recent antibiotic exposure, and continued diarrhea ?--GI consulted; may benefit from colonoscopy inpatient given sigmoid lesion  noted on CT scan ?--Continue to monitor hemoglobin every 8 hours, currently stable ?--Holding home aspirin ?--Clear liquid diet ?--Monitor on telemetry ? ?CKD stage IIIa ?Creatinine, stable. ? ?CAD ?Holding home aspirin as above ? ?Tobacco use disorder ?Counseled on need for complete cessation ? ? ?DVT prophylaxis: SCDs Start: 05/01/21 0459 ? ?  Code Status: Full Code ?Family Communication: Updated spouse present at bedside this morning ? ?Disposition Plan:  ?Level of care: Telemetry Medical ?Status is: Observation ?The patient remains OBS appropriate and will d/c before 2 midnights. ?  ? ?Consultants:  ?Gastroenterology ? ?Procedures:  ?None ? ?Antimicrobials:  ?None ? ? ?Subjective: ?Patient seen examined bedside, resting company.  Spouse present.  Reports dizziness and abdominal discomfort relatively resolved.  Awaiting GI input given lesion noted on CT scan to sigmoid region.  No further diarrhea this morning, no further reported rectal bleeding since arriving to the ED.  Patient does report diarrhea over the last 1-2 weeks with antibiotic exposure for recent possible diverticulitis flare that was prescribed by his PCP.  No other specific questions or concerns at this time.  Denies headache, no current dizziness, no chest pain, no palpitations, no shortness of breath, no current abdominal pain, no cough/congestion, no fever/chills/night sweats, no nausea/vomiting, no weakness, no fatigue, no paresthesias.  No acute events overnight per nursing staff. ? ?Objective: ?Vitals:  ? 05/01/21 0142 05/01/21 0200 05/01/21 0459 05/01/21 0092  ?BP: 99/61 114/67 113/78 111/65  ?Pulse: (!) 54 (!) 53 61 (!) 53  ?Resp: 14 (!) '24 17 16  '$ ?Temp:   97.8 ?F (36.6 ?C) 98 ?F (36.7 ?C)  ?TempSrc:   Oral Oral  ?SpO2: 96% 95% 95%  95%  ?Weight:      ?Height:      ? ? ?Intake/Output Summary (Last 24 hours) at 05/01/2021 1205 ?Last data filed at 05/01/2021 0449 ?Gross per 24 hour  ?Intake 1000 ml  ?Output 0 ml  ?Net 1000 ml  ? ?Filed Weights   ? 04/30/21 2350  ?Weight: 63 kg  ? ? ?Examination: ? ?Physical Exam: ?GEN: NAD, alert and oriented x 3, wd/wn ?HEENT: NCAT, PERRL, EOMI, sclera clear, MMM ?PULM: CTAB w/o wheezes/crackles, normal respiratory effort, on room air ?CV: RRR w/o M/G/R ?GI: abd soft, NTND, NABS, no R/G/M ?MSK: no peripheral edema, muscle strength globally intact 5/5 bilateral upper/lower extremities ?NEURO: CN II-XII intact, no focal deficits, sensation to light touch intact ?PSYCH: normal mood/affect ?Integumentary: dry/intact, no rashes or wounds ? ? ? ?Data Reviewed: I have personally reviewed following labs and imaging studies ? ?CBC: ?Recent Labs  ?Lab 05/01/21 ?0015 05/01/21 ?0404 05/01/21 ?1147  ?WBC 13.6* 13.2*  --   ?NEUTROABS 9.9*  --   --   ?HGB 12.2* 12.6* 11.6*  ?HCT 38.0* 39.0 35.6*  ?MCV 92.2 92.6  --   ?PLT 322 341  --   ? ?Basic Metabolic Panel: ?Recent Labs  ?Lab 05/01/21 ?0015 05/01/21 ?0404  ?NA 137 135  ?K 3.9 4.4  ?CL 106 109  ?CO2 26 22  ?GLUCOSE 106* 104*  ?BUN 24* 21  ?CREATININE 1.40* 1.35*  ?CALCIUM 8.5* 8.2*  ? ?GFR: ?Estimated Creatinine Clearance: 41.5 mL/min (A) (by C-G formula based on SCr of 1.35 mg/dL (H)). ?Liver Function Tests: ?Recent Labs  ?Lab 05/01/21 ?0015  ?AST 14*  ?ALT 11  ?ALKPHOS 50  ?BILITOT 0.5  ?PROT 6.5  ?ALBUMIN 3.2*  ? ?Recent Labs  ?Lab 05/01/21 ?0015  ?LIPASE 54*  ? ?No results for input(s): AMMONIA in the last 168 hours. ?Coagulation Profile: ?Recent Labs  ?Lab 05/01/21 ?0015  ?INR 1.2  ? ?Cardiac Enzymes: ?No results for input(s): CKTOTAL, CKMB, CKMBINDEX, TROPONINI in the last 168 hours. ?BNP (last 3 results) ?No results for input(s): PROBNP in the last 8760 hours. ?HbA1C: ?No results for input(s): HGBA1C in the last 72 hours. ?CBG: ?No results for input(s): GLUCAP in the last 168 hours. ?Lipid Profile: ?No results for input(s): CHOL, HDL, LDLCALC, TRIG, CHOLHDL, LDLDIRECT in the last 72 hours. ?Thyroid Function Tests: ?No results for input(s): TSH, T4TOTAL, FREET4, T3FREE,  THYROIDAB in the last 72 hours. ?Anemia Panel: ?No results for input(s): VITAMINB12, FOLATE, FERRITIN, TIBC, IRON, RETICCTPCT in the last 72 hours. ?Sepsis Labs: ?No results for input(s): PROCALCITON, LATICACIDVEN in the last 168 hours. ? ?No results found for this or any previous visit (from the past 240 hour(s)).  ? ? ? ? ? ?Radiology Studies: ?CT Angio Abd/Pel W and/or Wo Contrast ? ?Result Date: 05/01/2021 ?CLINICAL DATA:  Lower GI bleed EXAM: CTA ABDOMEN AND PELVIS WITHOUT AND WITH CONTRAST TECHNIQUE: Multidetector CT imaging of the abdomen and pelvis was performed using the standard protocol during bolus administration of intravenous contrast. Multiplanar reconstructed images and MIPs were obtained and reviewed to evaluate the vascular anatomy. RADIATION DOSE REDUCTION: This exam was performed according to the departmental dose-optimization program which includes automated exposure control, adjustment of the mA and/or kV according to patient size and/or use of iterative reconstruction technique. CONTRAST:  45m OMNIPAQUE IOHEXOL 350 MG/ML SOLN COMPARISON:  02/28/2021 FINDINGS: VASCULAR Aorta: Atherosclerotic calcifications. Marked irregularity throughout the abdominal aorta. No aneurysm. Maximum diameter 2.7 cm in the proximal to mid abdominal aorta. No dissection. Celiac:  Patent without evidence of aneurysm, dissection, vasculitis or significant stenosis. SMA: Patent without evidence of aneurysm, dissection, vasculitis or significant stenosis. Renals: 2 renal arteries bilaterally.  No stenosis. IMA: Patent without evidence of aneurysm, dissection, vasculitis or significant stenosis. Inflow: Atherosclerotic calcifications in regularity. No aneurysm, dissection or significant stenosis. Proximal Outflow: Atherosclerotic calcifications in the common femoral arteries. No significant stenosis, aneurysm or dissection. Veins: No obvious venous abnormality within the limitations of this arterial phase study. Review of  the MIP images confirms the above findings. NON-VASCULAR Lower chest: No acute findings Hepatobiliary: 3 cm cyst in the left hepatic lobe. Prior cholecystectomy. Pancreas: No focal abnormality or ductal dilatat

## 2021-05-01 NOTE — Assessment & Plan Note (Deleted)
-   This has resolved. ?- Given recent intake of antibiotics we will check stool pathogens and C. difficile. ? ?

## 2021-05-01 NOTE — H&P (Addendum)
?  ?  ?[place] ? ? ?PATIENT NAME: Christian Hebert   ? ?MR#:  [id number] ? ?DATE OF BIRTH:  [birth year] ? ?DATE OF ADMISSION:  04/30/2021 ? ?PRIMARY CARE PHYSICIAN: Leone Haven, MD  ? ?Patient is coming from: Home ? ?REQUESTING/REFERRING PHYSICIAN: Ward, Delice Bison, DO ? ?CHIEF COMPLAINT:  ? ?Chief Complaint  ?Patient presents with  ? Rectal Bleeding  ? ? ?HISTORY OF PRESENT ILLNESS:  ?LAVEL RIEMAN is a 76 y.o. male with medical history significant for COPD, coronary artery disease, left closed, GERD and tobacco abuse, who presented to emergency room with acute onset of diarrhea that started earlier this morning followed by bright red bleeding per rectum while laying in bed and associated abdominal discomfort.  No fever or chills.  No nausea or vomiting or abdominal pain.  No dysuria, oliguria or hematuria or flank pain.  No other bleeding diathesis.  He denies any chest pain or palpitations. ? ?ED Course: Upon presenting to the emergency room vital signs were within normal and later he was bradycardic with heart rate 48-59.  Labs revealed creatinine of 1.4, slightly above previous level of 1.31 and a BUN of 24 with calcium 8.5 and lipase 54 albumin 3.2 AST 14 with a total protein of 6.5.  Blood glucose is 106.  CBC showed leukocytosis 13.6 with neutrophilia as well as mild anemia. ? ?EKG as reviewed by me : EKG showed sinus rhythm with a rate of 55 with PACs, borderline prolonged PR interval and borderline right axis deviation.  It showed J-point elevation in inferior and lateral leads. ? ?Imaging: Abdominal and pelvic CT scan revealed the following: ?VASCULAR ?  ?Aortoiliac atherosclerosis and irregularity.  No aneurysm. ?  ?No visible active contrast extravasation to localize GI bleed. ?  ?NON-VASCULAR ?  ?Sigmoid diverticulosis. Area of wall thickening in the proximal and ?mid sigmoid colon is similar to prior study. This could reflect ?muscular hypertrophy low related to diverticulosis.  Annular ?mass/malignancy cannot be excluded. Consider further evaluation with ?optical colonoscopy after acute symptoms resolve. ? ?The patient was given hydration with IV normal saline 125 mill per hour and 4 mg of IV Zofran.  He will be admitted to an observation medical telemetry bed for further evaluation and management. ? ?PAST MEDICAL HISTORY:  ? ?Past Medical History:  ?Diagnosis Date  ? COPD (chronic obstructive pulmonary disease) ([place])   ? Coronary atherosclerosis   ? a. Treadmill Myoview 12/2015: Exercised for 8 minutes unable to achieve target heart rate, Lexiscan without significant ischemia, EF 55-65%  ? Diverticulitis   ? GERD (gastroesophageal reflux disease)   ? Inguinal hernia   ? Leukocytosis   ? Personal history of tobacco use, presenting hazards to health 07/20/2015  ? ? ?PAST SURGICAL HISTORY:  ? ?Past Surgical History:  ?Procedure Laterality Date  ? HERNIA REPAIR    ? bilateral inguinal   ? INGUINAL HERNIA REPAIR Bilateral 09/12/2016  ? Procedure: LAPAROSCOPIC BILATERAL INGUINAL HERNIA REPAIR;  Surgeon: Florene Glen, MD;  Location: [facility] ORS;  Service: General;  Laterality: Bilateral;  ? TONSILLECTOMY    ? ? ?SOCIAL HISTORY:  ? ?Social History  ? ?Tobacco Use  ? Smoking status: Some Days  ?  Packs/day: 1.00  ?  Years: 50.00  ?  Pack years: 50.00  ?  Types: Cigarettes  ?  Last attempt to quit: 01/08/2016  ?  Years since quitting: 5.3  ? Smokeless tobacco: Never  ?Substance Use Topics  ? Alcohol use: No  ?  Alcohol/week: 0.0 standard drinks  ? ? ?FAMILY HISTORY:  ? ?Family History  ?Problem Relation Age of Onset  ? Stroke Mother   ? Hypertension Mother   ? Heart disease Father   ? Hypertension Father   ? Lung cancer Father   ? Heart disease Sister   ? Cancer Neg Hx   ? ? ?DRUG ALLERGIES:  ? ?Allergies  ?Allergen Reactions  ? Oxycodone Nausea And Vomiting  ? ? ?REVIEW OF SYSTEMS:  ? ?ROS ?As per history of present illness. All pertinent systems were reviewed above. Constitutional, HEENT,  cardiovascular, respiratory, GI, GU, musculoskeletal, neuro, psychiatric, endocrine, integumentary and hematologic systems were reviewed and are otherwise negative/unremarkable except for positive findings mentioned above in the HPI. ? ? ?MEDICATIONS AT HOME:  ? ?Prior to Admission medications   ?Medication Sig Start Date End Date Taking? Authorizing Provider  ?aspirin 81 MG chewable tablet Chew by mouth daily.    [provider]  ?b complex vitamins capsule Take 1 capsule by mouth daily.    [provider]  ?loratadine (CLARITIN) 10 MG tablet Take 1 tablet (10 mg total) by mouth daily. 02/27/21   Leone Haven, MD  ?Multiple Vitamins-Minerals (MULTIVITAMIN WITH MINERALS) tablet Take 1 tablet by mouth daily.    [provider]  ?Probiotic Product (PROBIOTIC DAILY PO) Take by mouth.    [provider]  ?vitamin C (ASCORBIC ACID) 250 MG tablet Take 250 mg by mouth daily.    [provider]  ?VITAMIN D PO Take by mouth.    [provider]  ? ?  ? ?VITAL SIGNS:  ?Blood pressure 113/78, pulse 61, temperature 97.8 ?F (36.6 ?C), temperature source Oral, resp. rate 17, height '5\' 11"'$  (1.803 m), weight 63 kg, SpO2 95 %. ? ?PHYSICAL EXAMINATION:  ?Physical Exam ? ?GENERAL:  76 y.o.-year-old Caucasian male patient lying in the bed with no acute distress.  ?EYES: Pupils equal, round, reactive to light and accommodation. No scleral icterus. Extraocular muscles intact.  ?HEENT: Head atraumatic, normocephalic. Oropharynx and nasopharynx clear.  ?NECK:  Supple, no jugular venous distention. No thyroid enlargement, no tenderness.  ?LUNGS: Normal breath sounds bilaterally, no wheezing, rales,rhonchi or crepitation. No use of accessory muscles of respiration.  ?CARDIOVASCULAR: Regular rate and rhythm, S1, S2 normal. No murmurs, rubs, or gallops.  ?ABDOMEN: Soft, nondistended, nontender. Bowel sounds present. No organomegaly or mass.  ?EXTREMITIES: No pedal edema, cyanosis, or  clubbing.  ?NEUROLOGIC: Cranial nerves II through XII are intact. Muscle strength 5/5 in all extremities. Sensation intact. Gait not checked.  ?PSYCHIATRIC: The patient is alert and oriented x 3.  Normal affect and good eye contact. ?SKIN: No obvious rash, lesion, or ulcer.  ? ?LABORATORY PANEL:  ? ?CBC ?Recent Labs  ?Lab 05/01/21 ?0404  ?WBC 13.2*  ?HGB 12.6*  ?HCT 39.0  ?PLT 341  ? ?------------------------------------------------------------------------------------------------------------------ ? ?Chemistries  ?Recent Labs  ?Lab 05/01/21 ?0015 05/01/21 ?0404  ?NA 137 135  ?K 3.9 4.4  ?CL 106 109  ?CO2 26 22  ?GLUCOSE 106* 104*  ?BUN 24* 21  ?CREATININE 1.40* 1.35*  ?CALCIUM 8.5* 8.2*  ?AST 14*  --   ?ALT 11  --   ?ALKPHOS 50  --   ?BILITOT 0.5  --   ? ?------------------------------------------------------------------------------------------------------------------ ? ?Cardiac Enzymes ?No results for input(s): TROPONINI in the last 168 hours. ?------------------------------------------------------------------------------------------------------------------ ? ?RADIOLOGY:  ?CT Angio Abd/Pel W and/or Wo Contrast ? ?Result Date: 05/01/2021 ?CLINICAL DATA:  Lower GI bleed EXAM: CTA ABDOMEN AND PELVIS WITHOUT  AND WITH CONTRAST TECHNIQUE: Multidetector CT imaging of the abdomen and pelvis was performed using the standard protocol during bolus administration of intravenous contrast. Multiplanar reconstructed images and MIPs were obtained and reviewed to evaluate the vascular anatomy. RADIATION DOSE REDUCTION: This exam was performed according to the departmental dose-optimization program which includes automated exposure control, adjustment of the mA and/or kV according to patient size and/or use of iterative reconstruction technique. CONTRAST:  84m OMNIPAQUE IOHEXOL 350 MG/ML SOLN COMPARISON:  02/28/2021 FINDINGS: VASCULAR Aorta: Atherosclerotic calcifications. Marked irregularity throughout the abdominal aorta. No  aneurysm. Maximum diameter 2.7 cm in the proximal to mid abdominal aorta. No dissection. Celiac: Patent without evidence of aneurysm, dissection, vasculitis or significant stenosis. SMA: Patent without evidence of aneurysm, dissectio

## 2021-05-01 NOTE — Assessment & Plan Note (Addendum)
Likely secondary to colitis.  Hemoglobin upon discharge 12.0 ?

## 2021-05-01 NOTE — TOC Progression Note (Signed)
Transition of Care (TOC) - Progression Note  ? ? ?Patient Details  ?Name: Zimere Dunlevy Lowman ?MRN: [id number] ?Date of Birth: Jan 25, 1945 ? ?Transition of Care (TOC) CM/SW Contact  ?Laurena Slimmer, RN ?Phone Number: ?05/01/2021, 10:49 AM ? ?Clinical Narrative:    ? ? ?Transition of Care (TOC) Screening Note ? ? ?Patient Details  ?Name: Saahil Herbster Vandergrift ?Date of Birth: 05-Aug-1945 ? ? ?Transition of Care (TOC) CM/SW Contact:    ?Laurena Slimmer, RN ?Phone Number: ?05/01/2021, 10:49 AM ? ? ? ?Transition of Care Department [facility]) has reviewed patient and no TOC needs have been identified at this time. We will continue to monitor patient advancement through interdisciplinary progression rounds. If new patient transition needs arise, please place a TOC consult. ? ? ? ?  ?  ? ?Expected Discharge Plan and Services ?  ?  ?  ?  ?  ?                ?  ?  ?  ?  ?  ?  ?  ?  ?  ?  ? ? ?Social Determinants of Health (SDOH) Interventions ?  ? ?Readmission Risk Interventions ?   ? View : No data to display.  ?  ?  ?  ? ? ?

## 2021-05-01 NOTE — Progress Notes (Signed)
Attending MD for GI updated that patient is agreeable for procedure tomorrow.  ?

## 2021-05-01 NOTE — Assessment & Plan Note (Addendum)
CKD 3 A.  Creatinine upon discharge 1.27 with a GFR of 59 ?

## 2021-05-01 NOTE — Assessment & Plan Note (Deleted)
-   She has mild acute blood loss anemia due to her bleeding. ?- We will follow serial hemoglobins and hematocrits. ?- She will be typed and crossmatched. ?

## 2021-05-01 NOTE — H&P (Incomplete Revision)
?  ?  ?[place] ? ? ?PATIENT NAME: Christian Hebert   ? ?MR#:  [id number] ? ?DATE OF BIRTH:  1945-10-20 ? ?DATE OF ADMISSION:  04/30/2021 ? ?PRIMARY CARE PHYSICIAN: Leone Haven, MD  ? ?Patient is coming from: Home ? ?REQUESTING/REFERRING PHYSICIAN: Ward, Delice Bison, DO ? ?CHIEF COMPLAINT:  ? ?Chief Complaint  ?Patient presents with  ? Rectal Bleeding  ? ? ?HISTORY OF PRESENT ILLNESS:  ?Christian Hebert is a 76 y.o. male with medical history significant for COPD, coronary artery disease, left closed, GERD and tobacco abuse, who presented to emergency room with acute onset of diarrhea that started earlier this morning followed by bright red bleeding per rectum while laying in bed and associated abdominal discomfort.  No fever or chills.  No nausea or vomiting or abdominal pain.  No dysuria, oliguria or hematuria or flank pain.  No other bleeding diathesis.  He denies any chest pain or palpitations. ? ?ED Course: Upon presenting to the emergency room vital signs were within normal and later he was bradycardic with heart rate 48-59.  Labs revealed creatinine of 1.4, slightly above previous level of 1.31 and a BUN of 24 with calcium 8.5 and lipase 54 albumin 3.2 AST 14 with a total protein of 6.5.  Blood glucose is 106.  CBC showed leukocytosis 13.6 with neutrophilia as well as mild anemia. ? ?EKG as reviewed by me : EKG showed sinus rhythm with a rate of 55 with PACs, borderline prolonged PR interval and borderline right axis deviation.  It showed J-point elevation in inferior and lateral leads. ? ?Imaging: Abdominal and pelvic CT scan revealed the following: ?VASCULAR ?  ?Aortoiliac atherosclerosis and irregularity.  No aneurysm. ?  ?No visible active contrast extravasation to localize GI bleed. ?  ?NON-VASCULAR ?  ?Sigmoid diverticulosis. Area of wall thickening in the proximal and ?mid sigmoid colon is similar to prior study. This could reflect ?muscular hypertrophy low related to diverticulosis.  Annular ?mass/malignancy cannot be excluded. Consider further evaluation with ?optical colonoscopy after acute symptoms resolve. ? ?The patient was given hydration with IV normal saline 125 mill per hour and 4 mg of IV Zofran.  He will be admitted to an observation medical telemetry bed for further evaluation and management. ? ?PAST MEDICAL HISTORY:  ? ?Past Medical History:  ?Diagnosis Date  ? COPD (chronic obstructive pulmonary disease) ([place])   ? Coronary atherosclerosis   ? a. Treadmill Myoview 12/2015: Exercised for 8 minutes unable to achieve target heart rate, Lexiscan without significant ischemia, EF 55-65%  ? Diverticulitis   ? GERD (gastroesophageal reflux disease)   ? Inguinal hernia   ? Leukocytosis   ? Personal history of tobacco use, presenting hazards to health 07/20/2015  ? ? ?PAST SURGICAL HISTORY:  ? ?Past Surgical History:  ?Procedure Laterality Date  ? HERNIA REPAIR    ? bilateral inguinal   ? INGUINAL HERNIA REPAIR Bilateral 09/12/2016  ? Procedure: LAPAROSCOPIC BILATERAL INGUINAL HERNIA REPAIR;  Surgeon: Florene Glen, MD;  Location: [facility] ORS;  Service: General;  Laterality: Bilateral;  ? TONSILLECTOMY    ? ? ?SOCIAL HISTORY:  ? ?Social History  ? ?Tobacco Use  ? Smoking status: Some Days  ?  Packs/day: 1.00  ?  Years: 50.00  ?  Pack years: 50.00  ?  Types: Cigarettes  ?  Last attempt to quit: 01/08/2016  ?  Years since quitting: 5.3  ? Smokeless tobacco: Never  ?Substance Use Topics  ? Alcohol use: No  ?  Alcohol/week: 0.0 standard drinks  ? ? ?FAMILY HISTORY:  ? ?Family History  ?Problem Relation Age of Onset  ? Stroke Mother   ? Hypertension Mother   ? Heart disease Father   ? Hypertension Father   ? Lung cancer Father   ? Heart disease Sister   ? Cancer Neg Hx   ? ? ?DRUG ALLERGIES:  ? ?Allergies  ?Allergen Reactions  ? Oxycodone Nausea And Vomiting  ? ? ?REVIEW OF SYSTEMS:  ? ?ROS ?As per history of present illness. All pertinent systems were reviewed above. Constitutional, HEENT,  cardiovascular, respiratory, GI, GU, musculoskeletal, neuro, psychiatric, endocrine, integumentary and hematologic systems were reviewed and are otherwise negative/unremarkable except for positive findings mentioned above in the HPI. ? ? ?MEDICATIONS AT HOME:  ? ?Prior to Admission medications   ?Medication Sig Start Date End Date Taking? Authorizing Provider  ?aspirin 81 MG chewable tablet Chew by mouth daily.    [provider]  ?b complex vitamins capsule Take 1 capsule by mouth daily.    [provider]  ?loratadine (CLARITIN) 10 MG tablet Take 1 tablet (10 mg total) by mouth daily. 02/27/21   Leone Haven, MD  ?Multiple Vitamins-Minerals (MULTIVITAMIN WITH MINERALS) tablet Take 1 tablet by mouth daily.    [provider]  ?Probiotic Product (PROBIOTIC DAILY PO) Take by mouth.    [provider]  ?vitamin C (ASCORBIC ACID) 250 MG tablet Take 250 mg by mouth daily.    [provider]  ?VITAMIN D PO Take by mouth.    [provider]  ? ?  ? ?VITAL SIGNS:  ?Blood pressure 113/78, pulse 61, temperature 97.8 ?F (36.6 ?C), temperature source Oral, resp. rate 17, height '5\' 11"'$  (1.803 m), weight 63 kg, SpO2 95 %. ? ?PHYSICAL EXAMINATION:  ?Physical Exam ? ?GENERAL:  76 y.o.-year-old Caucasian male patient lying in the bed with no acute distress.  ?EYES: Pupils equal, round, reactive to light and accommodation. No scleral icterus. Extraocular muscles intact.  ?HEENT: Head atraumatic, normocephalic. Oropharynx and nasopharynx clear.  ?NECK:  Supple, no jugular venous distention. No thyroid enlargement, no tenderness.  ?LUNGS: Normal breath sounds bilaterally, no wheezing, rales,rhonchi or crepitation. No use of accessory muscles of respiration.  ?CARDIOVASCULAR: Regular rate and rhythm, S1, S2 normal. No murmurs, rubs, or gallops.  ?ABDOMEN: Soft, nondistended, nontender. Bowel sounds present. No organomegaly or mass.  ?EXTREMITIES: No pedal edema, cyanosis, or  clubbing.  ?NEUROLOGIC: Cranial nerves II through XII are intact. Muscle strength 5/5 in all extremities. Sensation intact. Gait not checked.  ?PSYCHIATRIC: The patient is alert and oriented x 3.  Normal affect and good eye contact. ?SKIN: No obvious rash, lesion, or ulcer.  ? ?LABORATORY PANEL:  ? ?CBC ?Recent Labs  ?Lab 05/01/21 ?0404  ?WBC 13.2*  ?HGB 12.6*  ?HCT 39.0  ?PLT 341  ? ?------------------------------------------------------------------------------------------------------------------ ? ?Chemistries  ?Recent Labs  ?Lab 05/01/21 ?0015 05/01/21 ?0404  ?NA 137 135  ?K 3.9 4.4  ?CL 106 109  ?CO2 26 22  ?GLUCOSE 106* 104*  ?BUN 24* 21  ?CREATININE 1.40* 1.35*  ?CALCIUM 8.5* 8.2*  ?AST 14*  --   ?ALT 11  --   ?ALKPHOS 50  --   ?BILITOT 0.5  --   ? ?------------------------------------------------------------------------------------------------------------------ ? ?Cardiac Enzymes ?No results for input(s): TROPONINI in the last 168 hours. ?------------------------------------------------------------------------------------------------------------------ ? ?RADIOLOGY:  ?CT Angio Abd/Pel W and/or Wo Contrast ? ?Result Date: 05/01/2021 ?CLINICAL DATA:  Lower GI bleed EXAM: CTA ABDOMEN AND PELVIS WITHOUT  AND WITH CONTRAST TECHNIQUE: Multidetector CT imaging of the abdomen and pelvis was performed using the standard protocol during bolus administration of intravenous contrast. Multiplanar reconstructed images and MIPs were obtained and reviewed to evaluate the vascular anatomy. RADIATION DOSE REDUCTION: This exam was performed according to the departmental dose-optimization program which includes automated exposure control, adjustment of the mA and/or kV according to patient size and/or use of iterative reconstruction technique. CONTRAST:  50m OMNIPAQUE IOHEXOL 350 MG/ML SOLN COMPARISON:  02/28/2021 FINDINGS: VASCULAR Aorta: Atherosclerotic calcifications. Marked irregularity throughout the abdominal aorta. No  aneurysm. Maximum diameter 2.7 cm in the proximal to mid abdominal aorta. No dissection. Celiac: Patent without evidence of aneurysm, dissection, vasculitis or significant stenosis. SMA: Patent without evidence of aneurysm, dissectio

## 2021-05-01 NOTE — ED Provider Notes (Signed)
? ?[facility] ?Provider Note ? ? ? Event Date/Time  ? First MD Initiated Contact with Patient 04/30/21 2357   ?  (approximate) ? ? ?History  ? ?Rectal Bleeding ? ? ?HPI ? ?Christian Hebert is a 76 y.o. male with history of CAD, COPD, diverticulosis who presents to the emergency department with complaints of lower abdominal cramping, diarrhea yesterday and now bright red blood per rectum.  States bleeding started tonight.  He has had nausea without vomiting.  No melena.  He has had previous inguinal hernia repair bilaterally.  No previous history of GI bleed.  He states he was on Cipro and Flagyl about a month ago for diverticulitis.  He has had previous colonoscopy in [place] that he states were normal.  Has an appointment to see a gastroenterologist here in July.  Not on blood thinners.  No fever. ? ? ?History provided by patient and wife. ? ? ? ?Past Medical History:  ?Diagnosis Date  ? COPD (chronic obstructive pulmonary disease) ([place])   ? Coronary atherosclerosis   ? a. Treadmill Myoview 12/2015: Exercised for 8 minutes unable to achieve target heart rate, Lexiscan without significant ischemia, EF 55-65%  ? Diverticulitis   ? GERD (gastroesophageal reflux disease)   ? Inguinal hernia   ? Leukocytosis   ? Personal history of tobacco use, presenting hazards to health 07/20/2015  ? ? ?Past Surgical History:  ?Procedure Laterality Date  ? HERNIA REPAIR    ? bilateral inguinal   ? INGUINAL HERNIA REPAIR Bilateral 09/12/2016  ? Procedure: LAPAROSCOPIC BILATERAL INGUINAL HERNIA REPAIR;  Surgeon: Florene Glen, MD;  Location: [facility] ORS;  Service: General;  Laterality: Bilateral;  ? TONSILLECTOMY    ? ? ?MEDICATIONS:  ?Prior to Admission medications   ?Medication Sig Start Date End Date Taking? Authorizing Provider  ?aspirin 81 MG chewable tablet Chew by mouth daily.    [provider]  ?b complex vitamins capsule Take 1 capsule by mouth daily.    [provider]  ?loratadine  (CLARITIN) 10 MG tablet Take 1 tablet (10 mg total) by mouth daily. 02/27/21   Leone Haven, MD  ?Multiple Vitamins-Minerals (MULTIVITAMIN WITH MINERALS) tablet Take 1 tablet by mouth daily.    [provider]  ?Probiotic Product (PROBIOTIC DAILY PO) Take by mouth.    [provider]  ?vitamin C (ASCORBIC ACID) 250 MG tablet Take 250 mg by mouth daily.    [provider]  ?VITAMIN D PO Take by mouth.    [provider]  ? ? ?Physical Exam  ? ?Triage Vital Signs: ?ED Triage Vitals [04/30/21 2350]  ?Enc Vitals Group  ?   BP 93/63  ?   Pulse Rate 61  ?   Resp 18  ?   Temp 97.8 ?F (36.6 ?C)  ?   Temp Source Oral  ?   SpO2 94 %  ?   Weight 139 lb (63 kg)  ?   Height '5\' 11"'$  (1.803 m)  ?   Head Circumference   ?   Peak Flow   ?   Pain Score 3  ?   Pain Loc   ?   Pain Edu?   ?   Excl. in [place]?   ? ? ?Most recent vital signs: ?Vitals:  ? 05/01/21 0200 05/01/21 0459  ?BP: 114/67 113/78  ?Pulse: (!) 53 61  ?Resp: (!) 24 17  ?Temp:  97.8 ?F (36.6 ?C)  ?SpO2: 95% 95%  ? ? ?  CONSTITUTIONAL: Alert and oriented and responds appropriately to questions.  Elderly, thin ?HEAD: Normocephalic, atraumatic ?EYES: Conjunctivae clear, pupils appear equal, sclera nonicteric ?ENT: normal nose; moist mucous membranes ?NECK: Supple, normal ROM ?CARD: RRR; S1 and S2 appreciated; no murmurs, no clicks, no rubs, no gallops ?RESP: Normal chest excursion without splinting or tachypnea; breath sounds clear and equal bilaterally; no wheezes, no rhonchi, no rales, no hypoxia or respiratory distress, speaking full sentences ?ABD/GI: Normal bowel sounds; non-distended; soft, tender in the right lower quadrant with out guarding or rebound ?RECTAL:  Normal rectal tone, patient has gross right red blood on rectal exam but no melena.  Guaiac positive.  He does have multiple external hemorrhoids that are nonthrombosed and do not appear to be actively bleeding.  Nontender rectal exam, no fecal impaction. Chaperone  present. ?BACK: The back appears normal ?EXT: Normal ROM in all joints; no deformity noted, no edema; no cyanosis ?SKIN: Normal color for age and race; warm; no rash on exposed skin ?NEURO: Moves all extremities equally, normal speech ?PSYCH: The patient's mood and manner are appropriate. ? ? ?ED Results / Procedures / Treatments  ? ?LABS: ?(all labs ordered are listed, but only abnormal results are displayed) ?Labs Reviewed  ?COMPREHENSIVE METABOLIC PANEL - Abnormal; Notable for the following components:  ?    Result Value  ? Glucose, Bld 106 (*)   ? BUN 24 (*)   ? Creatinine, Ser 1.40 (*)   ? Calcium 8.5 (*)   ? Albumin 3.2 (*)   ? AST 14 (*)   ? GFR, Estimated 52 (*)   ? All other components within normal limits  ?CBC WITH DIFFERENTIAL/PLATELET - Abnormal; Notable for the following components:  ? WBC 13.6 (*)   ? RBC 4.12 (*)   ? Hemoglobin 12.2 (*)   ? HCT 38.0 (*)   ? Neutro Abs 9.9 (*)   ? Monocytes Absolute 1.5 (*)   ? Abs Immature Granulocytes 0.08 (*)   ? All other components within normal limits  ?LIPASE, BLOOD - Abnormal; Notable for the following components:  ? Lipase 54 (*)   ? All other components within normal limits  ?BASIC METABOLIC PANEL - Abnormal; Notable for the following components:  ? Glucose, Bld 104 (*)   ? Creatinine, Ser 1.35 (*)   ? Calcium 8.2 (*)   ? GFR, Estimated 54 (*)   ? Anion gap 4 (*)   ? All other components within normal limits  ?CBC - Abnormal; Notable for the following components:  ? WBC 13.2 (*)   ? RBC 4.21 (*)   ? Hemoglobin 12.6 (*)   ? All other components within normal limits  ?PROTIME-INR  ?URINALYSIS, ROUTINE W REFLEX MICROSCOPIC  ?BASIC METABOLIC PANEL  ?CBC  ?HEMOGLOBIN AND HEMATOCRIT, BLOOD  ?HEMOGLOBIN AND HEMATOCRIT, BLOOD  ?TYPE AND SCREEN  ? ? ? ?EKG: ? EKG Interpretation ? ?Date/Time:  Tuesday May 01 2021 00:19:32 EDT ?Ventricular Rate:  55 ?PR Interval:  217 ?QRS Duration: 90 ?QT Interval:  464 ?QTC Calculation: 444 ?R Axis:   89 ?Text Interpretation: Sinus  rhythm Atrial premature complex Borderline prolonged PR interval Borderline right axis deviation Nonspecific T abnrm, anterolateral leads ST elevation, consider inferior injury Confirmed by Pryor Curia [phone]) on 05/01/2021 12:22:20 AM ?  ? ?  ? ? ? ?RADIOLOGY: ?My personal review and interpretation of imaging: CT scan shows no diverticulitis.  No sign of active bleeding. ? ?I have personally reviewed all radiology reports.   ?CT Angio  Abd/Pel W and/or Wo Contrast ? ?Result Date: 05/01/2021 ?CLINICAL DATA:  Lower GI bleed EXAM: CTA ABDOMEN AND PELVIS WITHOUT AND WITH CONTRAST TECHNIQUE: Multidetector CT imaging of the abdomen and pelvis was performed using the standard protocol during bolus administration of intravenous contrast. Multiplanar reconstructed images and MIPs were obtained and reviewed to evaluate the vascular anatomy. RADIATION DOSE REDUCTION: This exam was performed according to the departmental dose-optimization program which includes automated exposure control, adjustment of the mA and/or kV according to patient size and/or use of iterative reconstruction technique. CONTRAST:  40m OMNIPAQUE IOHEXOL 350 MG/ML SOLN COMPARISON:  02/28/2021 FINDINGS: VASCULAR Aorta: Atherosclerotic calcifications. Marked irregularity throughout the abdominal aorta. No aneurysm. Maximum diameter 2.7 cm in the proximal to mid abdominal aorta. No dissection. Celiac: Patent without evidence of aneurysm, dissection, vasculitis or significant stenosis. SMA: Patent without evidence of aneurysm, dissection, vasculitis or significant stenosis. Renals: 2 renal arteries bilaterally.  No stenosis. IMA: Patent without evidence of aneurysm, dissection, vasculitis or significant stenosis. Inflow: Atherosclerotic calcifications in regularity. No aneurysm, dissection or significant stenosis. Proximal Outflow: Atherosclerotic calcifications in the common femoral arteries. No significant stenosis, aneurysm or dissection. Veins: No obvious  venous abnormality within the limitations of this arterial phase study. Review of the MIP images confirms the above findings. NON-VASCULAR Lower chest: No acute findings Hepatobiliary: 3 cm cyst in the left hepat

## 2021-05-01 NOTE — Consult Note (Signed)
? ? ?Lucilla Lame, MD [facility]  ?[address] ?[place], [place] [address] ?Phone: [phone] ?Fax : [phone] ? Consultation ? ?Referring Provider:     Dr. Brunilda Payor ?Primary Care Physician:  Leone Haven, MD ?Primary Gastroenterologist: Althia Forts         ?Reason for Consultation:     Rectal bleeding ? ?Date of Admission:  04/30/2021 ?Date of Consultation:  05/01/2021 ?       ? HPI:   ?Christian Hebert is a 76 y.o. male who was admitted with rectal bleeding.  Patient reports that he started to have diarrhea yesterday with abdominal cramps and started having rectal bleeding.  He states his last colonoscopy was over 8 years ago in [place] before he moved to [place].  The patient started to feel weak and tired and came to the emergency department.  He has a history of diverticulitis and reports that he had been given antibiotics for diverticulitis a month ago.  The patient was set up to see GI with a appointment in July with Dr. Vicente Males.  He denies any further GI bleeding since being admitted.  The patient also reports that he has been followed by his primary care provider and hematology due to a 40 pound weight loss and leukocytosis.  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 ?Besides the rectal bleeding and diverticulitis history the patient also reports that he has been having a lot of diarrhea which he states is the reason he is losing weight.  The patient's hemoglobin in 2 months ago was 14.1 and he came in with a hemoglobin of 12.2 that is now down to 11.6 today. ?The patient's lipase was elevated at 54 with the upper limit of normal being 51. ? ?Past Medical History:  ?Diagnosis Date  ? COPD (chronic obstructive pulmonary disease) ([place])   ? Coronary atherosclerosis   ? a. Treadmill Myoview 12/2015: Exercised for 8 minutes unable to achieve target heart rate, Lexiscan without significant ischemia, EF 55-65%  ? Diverticulitis   ? GERD  (gastroesophageal reflux disease)   ? Inguinal hernia   ? Leukocytosis   ? Personal history of tobacco use, presenting hazards to health 07/20/2015  ? ? ?Past Surgical History:  ?Procedure Laterality Date  ? HERNIA REPAIR    ? bilateral inguinal   ? INGUINAL HERNIA REPAIR Bilateral 09/12/2016  ? Procedure: LAPAROSCOPIC BILATERAL INGUINAL HERNIA REPAIR;  Surgeon: Florene Glen, MD;  Location: [facility] ORS;  Service: General;  Laterality: Bilateral;  ? TONSILLECTOMY    ? ? ?Prior to Admission medications   ?Medication Sig Start Date End Date Taking? Authorizing Provider  ?aspirin 81 MG chewable tablet Chew by mouth daily.   Yes [provider]  ?b complex vitamins capsule Take 1 capsule by mouth daily.   Yes [provider]  ?Multiple Vitamins-Minerals (MULTIVITAMIN WITH MINERALS) tablet Take 1 tablet by mouth daily.   Yes [provider]  ?Probiotic Product (PROBIOTIC DAILY PO) Take by mouth.   Yes [provider]  ?vitamin C (ASCORBIC ACID) 250 MG tablet Take 250 mg by mouth daily.   Yes [provider]  ?VITAMIN D PO Take by mouth.   Yes [provider]  ?loratadine (CLARITIN) 10 MG tablet Take 1 tablet (10 mg total) by mouth daily. ?Patient not taking: Reported on 05/01/2021 02/27/21   Leone Haven, MD  ? ? ?Family History  ?Problem Relation Age of Onset  ? Stroke Mother   ?  Hypertension Mother   ? Heart disease Father   ? Hypertension Father   ? Lung cancer Father   ? Heart disease Sister   ? Cancer Neg Hx   ?  ? ?Social History  ? ?Tobacco Use  ? Smoking status: Some Days  ?  Packs/day: 1.00  ?  Years: 50.00  ?  Pack years: 50.00  ?  Types: Cigarettes  ?  Last attempt to quit: 01/08/2016  ?  Years since quitting: 5.3  ? Smokeless tobacco: Never  ?Vaping Use  ? Vaping Use: Never used  ?Substance Use Topics  ? Alcohol use: No  ?  Alcohol/week: 0.0 standard drinks  ? Drug use: No  ? ? ?Allergies as of 04/30/2021 - Review Complete 04/30/2021  ?Allergen Reaction  Noted  ? Oxycodone Nausea And Vomiting 07/05/2015  ? ? ?Review of Systems:    ?All systems reviewed and negative except where noted in HPI. ? ? Physical Exam:  ?Vital signs in last 24 hours: ?Temp:  [97.8 ?F (36.6 ?C)-98 ?F (36.7 ?C)] 98 ?F (36.7 ?C) (04/25 0867) ?Pulse Rate:  [53-61] 53 (04/25 0812) ?Resp:  [14-24] 16 (04/25 6195) ?BP: (93-115)/(61-78) 111/65 (04/25 0932) ?SpO2:  [94 %-96 %] 95 % (04/25 0812) ?Weight:  [63 kg] 63 kg (04/24 2350) ?Last BM Date : 04/30/21 ?General:   Pleasant, cooperative in NAD ?Head:  Normocephalic and atraumatic. ?Eyes:   No icterus.   Conjunctiva pink. PERRLA. ?Ears:  Normal auditory acuity. ?Msk:  Symmetrical without gross deformities.    ?Neurologic:  Alert and oriented x3;  grossly normal neurologically. ?Skin:  Intact without significant lesions or rashes. ?Cervical Nodes:  No significant cervical adenopathy. ?Psych:  Alert and cooperative. Normal affect. ? ?LAB RESULTS: ?Recent Labs  ?  05/01/21 ?0015 05/01/21 ?0404 05/01/21 ?1147  ?WBC 13.6* 13.2*  --   ?HGB 12.2* 12.6* 11.6*  ?HCT 38.0* 39.0 35.6*  ?PLT 322 341  --   ? ?BMET ?Recent Labs  ?  05/01/21 ?0015 05/01/21 ?0404  ?NA 137 135  ?K 3.9 4.4  ?CL 106 109  ?CO2 26 22  ?GLUCOSE 106* 104*  ?BUN 24* 21  ?CREATININE 1.40* 1.35*  ?CALCIUM 8.5* 8.2*  ? ?LFT ?Recent Labs  ?  05/01/21 ?0015  ?PROT 6.5  ?ALBUMIN 3.2*  ?AST 14*  ?ALT 11  ?ALKPHOS 50  ?BILITOT 0.5  ? ?PT/INR ?Recent Labs  ?  05/01/21 ?0015  ?LABPROT 14.6  ?INR 1.2  ? ? ?STUDIES: ?CT Angio Abd/Pel W and/or Wo Contrast ? ?Result Date: 05/01/2021 ?CLINICAL DATA:  Lower GI bleed EXAM: CTA ABDOMEN AND PELVIS WITHOUT AND WITH CONTRAST TECHNIQUE: Multidetector CT imaging of the abdomen and pelvis was performed using the standard protocol during bolus administration of intravenous contrast. Multiplanar reconstructed images and MIPs were obtained and reviewed to evaluate the vascular anatomy. RADIATION DOSE REDUCTION: This exam was performed according to the departmental  dose-optimization program which includes automated exposure control, adjustment of the mA and/or kV according to patient size and/or use of iterative reconstruction technique. CONTRAST:  32m OMNIPAQUE IOHEXOL 350 MG/ML SOLN COMPARISON:  02/28/2021 FINDINGS: VASCULAR Aorta: Atherosclerotic calcifications. Marked irregularity throughout the abdominal aorta. No aneurysm. Maximum diameter 2.7 cm in the proximal to mid abdominal aorta. No dissection. Celiac: Patent without evidence of aneurysm, dissection, vasculitis or significant stenosis. SMA: Patent without evidence of aneurysm, dissection, vasculitis or significant stenosis. Renals: 2 renal arteries bilaterally.  No stenosis. IMA: Patent without evidence of aneurysm, dissection, vasculitis or significant stenosis. Inflow:  Atherosclerotic calcifications in regularity. No aneurysm, dissection or significant stenosis. Proximal Outflow: Atherosclerotic calcifications in the common femoral arteries. No significant stenosis, aneurysm or dissection. Veins: No obvious venous abnormality within the limitations of this arterial phase study. Review of the MIP images confirms the above findings. NON-VASCULAR Lower chest: No acute findings Hepatobiliary: 3 cm cyst in the left hepatic lobe. Prior cholecystectomy. Pancreas: No focal abnormality or ductal dilatation. Spleen: No focal abnormality.  Normal size. Adrenals/Urinary Tract: No adrenal abnormality. No focal renal abnormality. No stones or hydronephrosis. Urinary bladder is unremarkable. Stomach/Bowel: Sigmoid diverticulosis. There is wall thickening within the proximal to mid sigmoid colon which may be related to diverticulosis although annular mass lesion cannot be excluded. This area had a similar appearance on prior study. No contrast extravasation to localize GI bleed. Stomach and small bowel decompressed. Lymphatic: No adenopathy Reproductive: Prostate enlargement. Other: No free fluid or free air. Musculoskeletal:  No acute bony abnormality. IMPRESSION: VASCULAR Aortoiliac atherosclerosis and irregularity.  No aneurysm. No visible active contrast extravasation to localize GI bleed. NON-VASCULAR Sigmoid diverticulosis

## 2021-05-01 NOTE — Plan of Care (Signed)

## 2021-05-02 ENCOUNTER — Encounter
Admission: EM | Disposition: A | Discharge status: Home / Self Care | Payer: Self-pay | Source: Home / Self Care | Attending: Internal Medicine

## 2021-05-02 ENCOUNTER — Inpatient Hospital Stay: Payer: Medicare Other | Admitting: Anesthesiology

## 2021-05-02 ENCOUNTER — Encounter: Payer: Self-pay | Admitting: Internal Medicine

## 2021-05-02 DIAGNOSIS — R197 Diarrhea, unspecified: Secondary | ICD-10-CM | POA: Diagnosis not present

## 2021-05-02 DIAGNOSIS — R634 Abnormal weight loss: Secondary | ICD-10-CM

## 2021-05-02 DIAGNOSIS — N1831 Chronic kidney disease, stage 3a: Secondary | ICD-10-CM | POA: Diagnosis not present

## 2021-05-02 DIAGNOSIS — K529 Noninfective gastroenteritis and colitis, unspecified: Secondary | ICD-10-CM | POA: Diagnosis not present

## 2021-05-02 DIAGNOSIS — K922 Gastrointestinal hemorrhage, unspecified: Secondary | ICD-10-CM | POA: Diagnosis not present

## 2021-05-02 HISTORY — PX: COLONOSCOPY WITH PROPOFOL: SHX5780

## 2021-05-02 HISTORY — PX: ESOPHAGOGASTRODUODENOSCOPY: SHX5428

## 2021-05-02 LAB — GASTROINTESTINAL PANEL BY PCR, STOOL (REPLACES STOOL CULTURE)

## 2021-05-02 LAB — BASIC METABOLIC PANEL
Anion gap: 6 (ref 5–15)
BUN: 15 mg/dL (ref 8–23)
CO2: 23 mmol/L (ref 22–32)
Calcium: 8.6 mg/dL — ABNORMAL LOW (ref 8.9–10.3)
Chloride: 108 mmol/L (ref 98–111)
Creatinine, Ser: 1.33 mg/dL — ABNORMAL HIGH (ref 0.61–1.24)
GFR, Estimated: 55 mL/min — ABNORMAL LOW (ref >60)
Glucose, Bld: 79 mg/dL (ref 70–99)
Potassium: 4.1 mmol/L (ref 3.5–5.1)
Sodium: 137 mmol/L (ref 135–145)

## 2021-05-02 LAB — CBC
HCT: 37.1 % — ABNORMAL LOW (ref 39.0–52.0)
Hemoglobin: 12 g/dL — ABNORMAL LOW (ref 13.0–17.0)
MCH: 29.4 pg (ref 26.0–34.0)
MCHC: 32.3 g/dL (ref 30.0–36.0)
MCV: 90.9 fL (ref 80.0–100.0)
Platelets: 324 10*3/uL (ref 150–400)
RBC: 4.08 MIL/uL — ABNORMAL LOW (ref 4.22–5.81)
RDW: 13 % (ref 11.5–15.5)
WBC: 10.1 10*3/uL (ref 4.0–10.5)
nRBC: 0 % (ref 0.0–0.2)

## 2021-05-02 LAB — C DIFFICILE QUICK SCREEN W PCR REFLEX
C Diff antigen: NEGATIVE
C Diff interpretation: NOT DETECTED
C Diff toxin: NEGATIVE

## 2021-05-02 SURGERY — COLONOSCOPY WITH PROPOFOL
Anesthesia: General

## 2021-05-02 MED ORDER — PROPOFOL 10 MG/ML IV BOLUS
INTRAVENOUS | Status: DC | PRN
  Administered 2021-05-02: 30 mg via INTRAVENOUS
  Administered 2021-05-02: 70 mg via INTRAVENOUS

## 2021-05-02 MED ORDER — GLYCOPYRROLATE 0.2 MG/ML IJ SOLN
INTRAMUSCULAR | Status: DC | PRN
  Administered 2021-05-02: .2 mg via INTRAVENOUS

## 2021-05-02 MED ORDER — LIDOCAINE HCL (CARDIAC) PF 100 MG/5ML IV SOSY
PREFILLED_SYRINGE | INTRAVENOUS | Status: DC | PRN
Start: 2021-05-02 — End: 2021-05-02
  Administered 2021-05-02: 50 mg via INTRAVENOUS

## 2021-05-02 MED ORDER — PROPOFOL 500 MG/50ML IV EMUL
INTRAVENOUS | Status: DC | PRN
Start: 2021-05-02 — End: 2021-05-02
  Administered 2021-05-02: 140 ug/kg/min via INTRAVENOUS

## 2021-05-02 MED ORDER — SODIUM CHLORIDE 0.9 % IV SOLN: INTRAVENOUS | Status: DC

## 2021-05-02 NOTE — Progress Notes (Addendum)
Telemetry notified nurse of 2.40sec pause on HR with lowest HR recorded at 35. Patient sleeping during this time and was alert and oriented on arousing with no objective or subjective signs noted or reported. Patients HR has been running in the 40's-50's, going up to 80's at times. Provider Chinita Pester notified of pause and drop in rate. Current BP 125/69 with MAP 85 at 0559 ?

## 2021-05-02 NOTE — Transfer of Care (Signed)
Immediate Anesthesia Transfer of Care Note ? ?Patient: Christian Hebert ? ?Procedure(s) Performed: COLONOSCOPY WITH PROPOFOL ?ESOPHAGOGASTRODUODENOSCOPY (EGD) ? ?Patient Location: PACU ? ?Anesthesia Type:General ? ?Level of Consciousness: sedated ? ?Airway & Oxygen Therapy: Patient Spontanous Breathing ? ?Post-op Assessment: Report given to RN and Post -op Vital signs reviewed and stable ? ?Post vital signs: Reviewed and stable ? ?Last Vitals:  ?Vitals Value Taken Time  ?BP 117/72 05/02/21 1443  ?Temp 36.1 ?C 05/02/21 1442  ?Pulse 78 05/02/21 1443  ?Resp 17 05/02/21 1443  ?SpO2 95 % 05/02/21 1443  ?Vitals shown include unvalidated device data. ? ?Last Pain:  ?Vitals:  ? 05/02/21 1442  ?TempSrc: Temporal  ?PainSc: Asleep  ?   ? ?Patients Stated Pain Goal: 0 (05/02/21 1356) ? ?Complications: No notable events documented. ?

## 2021-05-02 NOTE — Plan of Care (Signed)

## 2021-05-02 NOTE — Op Note (Signed)
[facility] ?Gastroenterology ?Patient Name: Christian Hebert ?Procedure Date: 05/02/2021 1:57 PM ?MRN: [id number] ?Account #: [id number] ?Date of Birth: 10/09/1945 ?Admit Type: Inpatient ?Age: 76 ?Room: [facility] ENDO ROOM 3 ?Gender: Male ?Note Status: Finalized ?Instrument Name: Colonoscope [id number] ?Procedure:             Colonoscopy ?Indications:           Rectal bleeding ?Providers:             Jonathon Bellows MD, MD ?Referring MD:          Angela Adam. Caryl Bis (Referring MD) ?Medicines:             Monitored Anesthesia Care ?Complications:         No immediate complications. ?Procedure:             Pre-Anesthesia Assessment: ?                       - Prior to the procedure, a History and Physical was  ?                       performed, and patient medications, allergies and  ?                       sensitivities were reviewed. The patient's tolerance  ?                       of previous anesthesia was reviewed. ?                       - The risks and benefits of the procedure and the  ?                       sedation options and risks were discussed with the  ?                       patient. All questions were answered and informed  ?                       consent was obtained. ?                       - ASA Grade Assessment: II - A patient with mild  ?                       systemic disease. ?                       After obtaining informed consent, the colonoscope was  ?                       passed under direct vision. Throughout the procedure,  ?                       the patient's blood pressure, pulse, and oxygen  ?                       saturations were monitored continuously. The  ?                       Colonoscope was introduced through the anus  and  ?                       advanced to the the cecum, identified by the  ?                       appendiceal orifice. The colonoscopy was performed  ?                       with ease. The patient tolerated the procedure well.  ?                       The  quality of the bowel preparation was poor. ?Findings: ?     The perianal and digital rectal examinations were normal. ?     Inflammation was found in a continuous and circumferential pattern from  ?     the anus to the cecum. This was graded as Mayo Score 2 (moderate, with  ?     marked erythema, absent vascular pattern, friability, erosions), and  ?     when compared to the previous examination, the findings are new.  ?     Biopsies were taken with a cold forceps for histology. ?     A localized area of moderately friable mucosa with contact bleeding was  ?     found in the sigmoid colon. This was biopsied with a cold forceps for  ?     histology. ?     The exam was otherwise without abnormality. ?Impression:            - Preparation of the colon was poor. ?                       - Moderately active (Mayo Score 2) ulcerative colitis,  ?                       new since the last examination. Biopsied. ?                       - Friability with contact bleeding in the sigmoid  ?                       colon. Biopsied. ?                       - The examination was otherwise normal. ?Recommendation:        - Return patient to hospital ward for ongoing care. ?                       - Advance diet as tolerated. ?                       - Continue present medications. ?                       - Ensure not on any NSAID's ?                       If having diarrhea check stool for GI pcr and c diff ?                       follow  up path results- if infectious diarrhea ruled  ?                       out then need to consider starting on steroids ?Procedure Code(s):     --- Professional --- ?                       [id number], Colonoscopy, flexible; with biopsy, single or  ?                       multiple ?Diagnosis Code(s):     --- Professional --- ?                       K51.911, Ulcerative colitis, unspecified with rectal  ?                       bleeding ?                       K92.2, Gastrointestinal hemorrhage, unspecified ?                        K62.5, Hemorrhage of anus and rectum ?CPT copyright 2019 American Medical Association. All rights reserved. ?The codes documented in this report are preliminary and upon coder review may  ?be revised to meet current compliance requirements. ?Jonathon Bellows, MD ?Jonathon Bellows MD, MD ?05/02/2021 2:40:50 PM ?This report has been signed electronically. ?Number of Addenda: 0 ?Note Initiated On: 05/02/2021 1:57 PM ?Scope Withdrawal Time: 0 hours 8 minutes 59 seconds  ?Total Procedure Duration: 0 hours 14 minutes 37 seconds  ?Estimated Blood Loss:  Estimated blood loss: none. ?     [facility] ?

## 2021-05-02 NOTE — Progress Notes (Signed)
?  Progress Note ? ? ?Patient: Christian Hebert DEY:[id number] DOB: 12/27/1945 DOA: 04/30/2021     1 ?DOS: the patient was seen and examined on 05/02/2021 ?  ? ? ?Assessment and Plan: ?* Colitis ?Colonoscopy showed severe colitis.  Case discussed with gastroenterology and recommended stool studies and if negative may end up starting steroids.  With bloody diarrhea I will hold off on antibiotics until stool studies resulted. ? ?Lower GI bleeding ?Likely secondary to colitis.  Hemoglobin stable. ? ?Chronic kidney disease (CKD), stage III (moderate) (HCC) ?CKD 3 A.  Creatinine at baseline at 1.33 with a GFR 55. ? ?COPD (chronic obstructive pulmonary disease) ([place]) ?Respiratory status stable. ? ? ? ? ?  ? ?Subjective: Patient seen this morning and again this afternoon.  He stated he had 3 episodes of bloody bowel movements prior to coming in.  No further blood seen.  Feeling okay now. ? ?Physical Exam: ?Vitals:  ? 05/02/21 1443 05/02/21 1502 05/02/21 1512 05/02/21 1547  ?BP: 117/72 116/80  (!) 169/81  ?Pulse: 78   (!) 50  ?Resp: 16   18  ?Temp:    97.6 ?F (36.4 ?C)  ?TempSrc:    Oral  ?SpO2: 95%  100% 98%  ?Weight:      ?Height:      ? ?Physical Exam ?HENT:  ?   Head: Normocephalic.  ?   Mouth/Throat:  ?   Pharynx: No oropharyngeal exudate.  ?Eyes:  ?   General: Lids are normal.  ?   Conjunctiva/sclera: Conjunctivae normal.  ?Cardiovascular:  ?   Rate and Rhythm: Normal rate and regular rhythm.  ?   Heart sounds: Normal heart sounds, S1 normal and S2 normal.  ?Pulmonary:  ?   Breath sounds: No decreased breath sounds, wheezing, rhonchi or rales.  ?Abdominal:  ?   Palpations: Abdomen is soft.  ?   Tenderness: There is no abdominal tenderness.  ?Musculoskeletal:  ?   Right lower leg: No swelling.  ?   Left lower leg: No swelling.  ?Skin: ?   General: Skin is warm.  ?   Findings: No rash.  ?Neurological:  ?   Mental Status: He is alert and oriented to person, place, and time.  ?  ?Data Reviewed: ?Creatinine 1.33 with a GFR 55,  white blood cell count 10.1, hemoglobin 12.0 ? ?Family Communication: Spoke with wife at the bedside this afternoon ? ?Disposition: ?Status is: Inpatient ?Remains inpatient appropriate because: Colonoscopy showing severe colitis.  Gastroenterology wants to send stool studies off and if negative may end up starting steroids. ? ?Planned Discharge Destination: Home ? ? ?Author: ?Loletha Grayer, MD ?05/02/2021 3:59 PM ? ?For on call review [email or web address].  ?

## 2021-05-02 NOTE — Op Note (Signed)
[facility] ?Gastroenterology ?Patient Name: Christian Hebert ?Procedure Date: 05/02/2021 1:57 PM ?MRN: [id number] ?Account #: [id number] ?Date of Birth: 1945/11/20 ?Admit Type: Inpatient ?Age: 76 ?Room: [facility] ENDO ROOM 3 ?Gender: Male ?Note Status: Finalized ?Instrument Name: Upper Endoscope [id number] ?Procedure:             Upper GI endoscopy ?Indications:           Weight loss ?Providers:             Jonathon Bellows MD, MD ?Referring MD:          Angela Adam. Caryl Bis (Referring MD) ?Medicines:             Monitored Anesthesia Care ?Complications:         No immediate complications. ?Procedure:             Pre-Anesthesia Assessment: ?                       - Prior to the procedure, a History and Physical was  ?                       performed, and patient medications, allergies and  ?                       sensitivities were reviewed. The patient's tolerance  ?                       of previous anesthesia was reviewed. ?                       - The risks and benefits of the procedure and the  ?                       sedation options and risks were discussed with the  ?                       patient. All questions were answered and informed  ?                       consent was obtained. ?                       - ASA Grade Assessment: II - A patient with mild  ?                       systemic disease. ?                       After obtaining informed consent, the endoscope was  ?                       passed under direct vision. Throughout the procedure,  ?                       the patient's blood pressure, pulse, and oxygen  ?                       saturations were monitored continuously. The Endoscope  ?                       was introduced  through the mouth, and advanced to the  ?                       fourth part of duodenum. The upper GI endoscopy was  ?                       accomplished with ease. The patient tolerated the  ?                       procedure well. ?Findings: ?     A non-obstructing  Schatzki ring was found at the gastroesophageal  ?     junction. ?     Diffuse moderate inflammation characterized by congestion (edema),  ?     erythema, granularity and aphthous ulcerations was found in the entire  ?     examined stomach. Biopsies were taken with a cold forceps for histology. ?     A single 15 mm sessile polyp with no bleeding was found in the fourth  ?     portion of the duodenum. Biopsies were taken with a cold forceps for  ?     histology. ?     The cardia and gastric fundus were normal on retroflexion. ?     A medium-sized hiatal hernia was present. ?Impression:            - Non-obstructing Schatzki ring. ?                       - Gastritis. Biopsied. ?                       - A single duodenal polyp. Biopsied. ?                       - Medium-sized hiatal hernia. ?Recommendation:        - Await pathology results. ?                       - Perform a colonoscopy today. ?Procedure Code(s):     --- Professional --- ?                       [phone], Esophagogastroduodenoscopy, flexible,  ?                       transoral; with biopsy, single or multiple ?Diagnosis Code(s):     --- Professional --- ?                       K22.2, Esophageal obstruction ?                       K29.70, Gastritis, unspecified, without bleeding ?                       K31.7, Polyp of stomach and duodenum ?                       K44.9, Diaphragmatic hernia without obstruction or  ?                       gangrene ?  R63.4, Abnormal weight loss ?CPT copyright 2019 American Medical Association. All rights reserved. ?The codes documented in this report are preliminary and upon coder review may  ?be revised to meet current compliance requirements. ?Jonathon Bellows, MD ?Jonathon Bellows MD, MD ?05/02/2021 2:20:06 PM ?This report has been signed electronically. ?Number of Addenda: 0 ?Note Initiated On: 05/02/2021 1:57 PM ?Estimated Blood Loss:  Estimated blood loss: none. ?     [facility] ?

## 2021-05-02 NOTE — H&P (Signed)
? ? ? ?Jonathon Bellows, MD ?[address], [place], [place], [place], [address] ?[address] [place], [place], [place], [address] ?Phone: [phone]  ?Fax: [phone] ? ?Primary Care Physician:  Leone Haven, MD ? ? ?Pre-Procedure History & Physical: ?HPI:  Christian Hebert is a 76 y.o. male is here for an endoscopy and colonoscopy  ?  ?Past Medical History:  ?Diagnosis Date  ? COPD (chronic obstructive pulmonary disease) ([place])   ? Coronary atherosclerosis   ? a. Treadmill Myoview 12/2015: Exercised for 8 minutes unable to achieve target heart rate, Lexiscan without significant ischemia, EF 55-65%  ? Diverticulitis   ? GERD (gastroesophageal reflux disease)   ? Inguinal hernia   ? Leukocytosis   ? Personal history of tobacco use, presenting hazards to health 07/20/2015  ? ? ?Past Surgical History:  ?Procedure Laterality Date  ? HERNIA REPAIR    ? bilateral inguinal   ? INGUINAL HERNIA REPAIR Bilateral 09/12/2016  ? Procedure: LAPAROSCOPIC BILATERAL INGUINAL HERNIA REPAIR;  Surgeon: Florene Glen, MD;  Location: [facility] ORS;  Service: General;  Laterality: Bilateral;  ? TONSILLECTOMY    ? ? ?Prior to Admission medications   ?Medication Sig Start Date End Date Taking? Authorizing Provider  ?aspirin 81 MG chewable tablet Chew by mouth daily.   Yes [provider]  ?b complex vitamins capsule Take 1 capsule by mouth daily.   Yes [provider]  ?Multiple Vitamins-Minerals (MULTIVITAMIN WITH MINERALS) tablet Take 1 tablet by mouth daily.   Yes [provider]  ?Probiotic Product (PROBIOTIC DAILY PO) Take by mouth.   Yes [provider]  ?vitamin C (ASCORBIC ACID) 250 MG tablet Take 250 mg by mouth daily.   Yes [provider]  ?VITAMIN D PO Take by mouth.   Yes [provider]  ?loratadine (CLARITIN) 10 MG tablet Take 1 tablet (10 mg total) by mouth daily. ?Patient not taking: Reported on 05/01/2021 02/27/21   Leone Haven, MD  ? ? ?Allergies as of  04/30/2021 - Review Complete 04/30/2021  ?Allergen Reaction Noted  ? Oxycodone Nausea And Vomiting 07/05/2015  ? ? ?Family History  ?Problem Relation Age of Onset  ? Stroke Mother   ? Hypertension Mother   ? Heart disease Father   ? Hypertension Father   ? Lung cancer Father   ? Heart disease Sister   ? Cancer Neg Hx   ? ? ?Social History  ? ?Socioeconomic History  ? Marital status: Married  ?  Spouse name: Not on file  ? Number of children: Not on file  ? Years of education: Not on file  ? Highest education level: Not on file  ?Occupational History  ? Not on file  ?Tobacco Use  ? Smoking status: Some Days  ?  Packs/day: 1.00  ?  Years: 50.00  ?  Pack years: 50.00  ?  Types: Cigarettes  ?  Last attempt to quit: 01/08/2016  ?  Years since quitting: 5.3  ? Smokeless tobacco: Never  ?Vaping Use  ? Vaping Use: Never used  ?Substance and Sexual Activity  ? Alcohol use: No  ?  Alcohol/week: 0.0 standard drinks  ? Drug use: No  ? Sexual activity: Not Currently  ?Other Topics Concern  ? Not on file  ?Social History Narrative  ? Not on file  ? ?Social Determinants of Health  ? ?Financial Resource Strain: Low Risk   ? Difficulty of Paying Living Expenses: Not hard at all  ?Food Insecurity:  No Food Insecurity  ? Worried About Charity fundraiser in the Last Year: Never true  ? Ran Out of Food in the Last Year: Never true  ?Transportation Needs: No Transportation Needs  ? Lack of Transportation (Medical): No  ? Lack of Transportation (Non-Medical): No  ?Physical Activity: Insufficiently Active  ? Days of Exercise per Week: 3 days  ? Minutes of Exercise per Session: 30 min  ?Stress: No Stress Concern Present  ? Feeling of Stress : Not at all  ?Social Connections: Unknown  ? Frequency of Communication with Friends and Family: Not on file  ? Frequency of Social Gatherings with Friends and Family: Not on file  ? Attends Religious Services: Not on file  ? Active Member of Clubs or Organizations: Not on file  ? Attends Theatre manager Meetings: Not on file  ? Marital Status: Married  ?Intimate Partner Violence: Not At Risk  ? Fear of Current or Ex-Partner: No  ? Emotionally Abused: No  ? Physically Abused: No  ? Sexually Abused: No  ? ? ?Review of Systems: ?See HPI, otherwise negative ROS ? ?Physical Exam: ?BP 136/64 (BP Location: Right Arm)   Pulse (!) 49   Temp 98.1 ?F (36.7 ?C) (Oral)   Resp 18   Ht '5\' 11"'$  (1.803 m)   Wt 63 kg   SpO2 96%   BMI 19.39 kg/m?  ?General:   Alert,  pleasant and cooperative in NAD ?Head:  Normocephalic and atraumatic. ?Neck:  Supple; no masses or thyromegaly. ?Lungs:  Clear throughout to auscultation, normal respiratory effort.    ?Heart:  +S1, +S2, Regular rate and rhythm, No edema. ?Abdomen:  Soft, nontender and nondistended. Normal bowel sounds, without guarding, and without rebound.   ?Neurologic:  Alert and  oriented x4;  grossly normal neurologically. ? ?Impression/Plan: ?Christian Hebert is here for an endoscopy and colonoscopy  to be performed for  evaluation of rectal bleeding  ?   ?Risks, benefits, limitations, and alternatives regarding endoscopy have been reviewed with the patient.  Questions have been answered.  All parties agreeable. ? ? ?Jonathon Bellows, MD  05/02/2021, 1:49 PM ? ?

## 2021-05-02 NOTE — Anesthesia Procedure Notes (Signed)
Date/Time: 05/02/2021 2:05 PM ?Performed by: Johnna Acosta, CRNA ?Pre-anesthesia Checklist: Patient identified, Emergency Drugs available, Suction available, Patient being monitored and Timeout performed ?Patient Re-evaluated:Patient Re-evaluated prior to induction ?Oxygen Delivery Method: Nasal cannula ?Preoxygenation: Pre-oxygenation with 100% oxygen ?Induction Type: IV induction ? ? ? ? ?

## 2021-05-02 NOTE — Anesthesia Preprocedure Evaluation (Signed)
Anesthesia Evaluation  ?Patient identified by MRN, date of birth, ID band ?Patient awake ? ? ? ?Reviewed: ?Allergy & Precautions, NPO status , Patient's Chart, lab work & pertinent test results, reviewed documented beta blocker date and time  ? ?History of Anesthesia Complications ?Negative for: history of anesthetic complications ? ?Airway ?Mallampati: II ? ?TM Distance: >3 FB ?Neck ROM: Full ? ? ? Dental ? ?(+) Chipped ?  ?Pulmonary ?neg sleep apnea, COPD, Current Smoker and Patient abstained from smoking.,  ?  ?Pulmonary exam normal ?breath sounds clear to auscultation ? ? ? ? ? ? Cardiovascular ?Exercise Tolerance: Good ?METS(-) hypertension+ CAD and + Peripheral Vascular Disease  ?(-) Past MI (-) dysrhythmias  ?Rhythm:Regular Rate:Bradycardia ?- Systolic murmurs ?Sinus bradycardia this admission. Asymptomatic. Echo within past few years normal. ?  ?Neuro/Psych ?negative neurological ROS ? negative psych ROS  ? GI/Hepatic ?GERD  Controlled,(+)  ?  ? (-) substance abuse ? ,   ?Endo/Other  ?neg diabetes ? Renal/GU ?negative Renal ROS  ? ?  ?Musculoskeletal ? ?(+) Arthritis ,  ? Abdominal ?  ?Peds ? Hematology ? ?(+) Blood dyscrasia, anemia ,   ?Anesthesia Other Findings ?Past Medical History: ?No date: COPD (chronic obstructive pulmonary disease) ([place]) ?No date: Coronary atherosclerosis ?    Comment:  a. Treadmill Myoview 12/2015: Exercised for 8 minutes  ?             unable to achieve target heart rate, Lexiscan without  ?             significant ischemia, EF 55-65% ?No date: Diverticulitis ?No date: GERD (gastroesophageal reflux disease) ?No date: Inguinal hernia ?No date: Leukocytosis ?07/20/2015: Personal history of tobacco use, presenting hazards to  ?health ? Reproductive/Obstetrics ? ?  ? ? ? ? ? ? ? ? ? ? ? ? ? ?  ?  ? ? ? ? ? ? ? ? ?Anesthesia Physical ? ?Anesthesia Plan ? ?ASA: 3 ? ?Anesthesia Plan: General  ? ?Post-op Pain Management: Minimal or no pain anticipated   ? ?Induction: Intravenous ? ?PONV Risk Score and Plan: 1 and Propofol infusion, TIVA and Ondansetron ? ?Airway Management Planned: Natural Airway ? ?Additional Equipment: None ? ?Intra-op Plan:  ? ?Post-operative Plan:  ? ?Informed Consent: I have reviewed the patients History and Physical, chart, labs and discussed the procedure including the risks, benefits and alternatives for the proposed anesthesia with the patient or authorized representative who has indicated his/her understanding and acceptance.  ? ? ? ?Dental advisory given ? ?Plan Discussed with: CRNA ? ?Anesthesia Plan Comments: (Discussed risks of anesthesia with patient, including possibility of difficulty with spontaneous ventilation under anesthesia necessitating airway intervention, PONV, and rare risks such as cardiac or respiratory or neurological events, and allergic reactions. Discussed the role of CRNA in patient's perioperative care. Patient understands.)  ? ? ? ? ? ? ?Anesthesia Quick Evaluation ? ?

## 2021-05-02 NOTE — Assessment & Plan Note (Signed)
Respiratory status stable. 

## 2021-05-02 NOTE — Anesthesia Postprocedure Evaluation (Signed)
Anesthesia Post Note ? ?Patient: Christian Hebert ? ?Procedure(s) Performed: COLONOSCOPY WITH PROPOFOL ?ESOPHAGOGASTRODUODENOSCOPY (EGD) ? ?Patient location during evaluation: Endoscopy ?Anesthesia Type: General ?Level of consciousness: awake and alert ?Pain management: pain level controlled ?Vital Signs Assessment: post-procedure vital signs reviewed and stable ?Respiratory status: spontaneous breathing, nonlabored ventilation, respiratory function stable and patient connected to nasal cannula oxygen ?Cardiovascular status: blood pressure returned to baseline and stable ?Postop Assessment: no apparent nausea or vomiting ?Anesthetic complications: no ? ? ?No notable events documented. ? ? ?Last Vitals:  ?Vitals:  ? 05/02/21 1442 05/02/21 1443  ?BP:  117/72  ?Pulse: 77 78  ?Resp: 17 16  ?Temp: (!) 36.1 ?C   ?SpO2: 95% 95%  ?  ?Last Pain:  ?Vitals:  ? 05/02/21 1442  ?TempSrc: Temporal  ?PainSc: Asleep  ? ? ?  ?  ?  ?  ?  ?  ? ?Martha Clan ? ? ? ? ?

## 2021-05-02 NOTE — Assessment & Plan Note (Addendum)
Colonoscopy showed severe colitis.  Stool studies were negative.  With bloody diarrhea, we held off on antibiotics.  Case discussed with gastroenterology and started the budesonide.  Follow-up colon biopsy results and follow-up with gastroenterology as outpatient. ?

## 2021-05-03 ENCOUNTER — Telehealth: Payer: Self-pay | Admitting: Family Medicine

## 2021-05-03 ENCOUNTER — Encounter: Payer: Self-pay | Admitting: Gastroenterology

## 2021-05-03 DIAGNOSIS — J439 Emphysema, unspecified: Secondary | ICD-10-CM

## 2021-05-03 DIAGNOSIS — K529 Noninfective gastroenteritis and colitis, unspecified: Secondary | ICD-10-CM | POA: Diagnosis not present

## 2021-05-03 DIAGNOSIS — K922 Gastrointestinal hemorrhage, unspecified: Secondary | ICD-10-CM | POA: Diagnosis not present

## 2021-05-03 DIAGNOSIS — N1831 Chronic kidney disease, stage 3a: Secondary | ICD-10-CM | POA: Diagnosis not present

## 2021-05-03 LAB — BASIC METABOLIC PANEL
Anion gap: 4 — ABNORMAL LOW (ref 5–15)
BUN: 15 mg/dL (ref 8–23)
CO2: 24 mmol/L (ref 22–32)
Calcium: 8.5 mg/dL — ABNORMAL LOW (ref 8.9–10.3)
Chloride: 107 mmol/L (ref 98–111)
Creatinine, Ser: 1.27 mg/dL — ABNORMAL HIGH (ref 0.61–1.24)
GFR, Estimated: 59 mL/min — ABNORMAL LOW (ref >60)
Glucose, Bld: 101 mg/dL — ABNORMAL HIGH (ref 70–99)
Potassium: 4.2 mmol/L (ref 3.5–5.1)
Sodium: 135 mmol/L (ref 135–145)

## 2021-05-03 MED ORDER — ACETAMINOPHEN 325 MG PO TABS
650.0000 mg | ORAL_TABLET | Freq: Four times a day (QID) | ORAL | Status: AC | PRN
Start: 2021-05-03 — End: ?

## 2021-05-03 MED ORDER — BUDESONIDE 3 MG PO CPEP
9.0000 mg | ORAL_CAPSULE | Freq: Every day | ORAL | Status: DC
  Administered 2021-05-03: 9 mg via ORAL
  Filled 2021-05-03: qty 3

## 2021-05-03 MED ORDER — BUDESONIDE 3 MG PO CPEP: 9.0000 mg | ORAL_CAPSULE | Freq: Every day | ORAL | 0 refills | Status: DC

## 2021-05-03 NOTE — Discharge Summary (Signed)
?Physician Discharge Summary ?  ?Patient: Christian Hebert MRN: [id number] DOB: December 26, 1945  ?Admit date:     04/30/2021  ?Discharge date: 05/03/21  ?Discharge Physician: Loletha Grayer  ? ?PCP: Leone Haven, MD  ? ?Recommendations at discharge:  ? ?Follow-up 5 days ?Follow-up gastroenterology in a couple weeks ? ?Discharge Diagnoses: ?Principal Problem: ?  Colitis ?Active Problems: ?  Lower GI bleeding ?  Chronic kidney disease (CKD), stage III (moderate) (HCC) ?  Bloody diarrhea ?  COPD (chronic obstructive pulmonary disease) ([place]) ?  Lower GI bleed ? ? ?Hospital Course: ?The patient was admitted to the hospital on 05/01/2021 and discharged on 05/03/2021.  Patient came in with rectal bleeding.  He stated that he had 3 episodes of bleeding at home with some diarrhea.  The patient had a colonoscopy that showed diffuse colitis and biopsies were taken.  Stool studies were negative.  Gastroenterology started budesonide.  The budesonide price was very high at his normal pharmacy.  We had called into [facility] with a good Rx prescription in order to get it at a decent price. ? ?Assessment and Plan: ?* Colitis ?Colonoscopy showed severe colitis.  Stool studies were negative.  With bloody diarrhea, we held off on antibiotics.  Case discussed with gastroenterology and started the budesonide.  Follow-up colon biopsy results and follow-up with gastroenterology as outpatient. ? ?Lower GI bleeding ?Likely secondary to colitis.  Hemoglobin upon discharge 12.0 ? ?Chronic kidney disease (CKD), stage III (moderate) (HCC) ?CKD 3 A.  Creatinine upon discharge 1.27 with a GFR of 59 ? ?COPD (chronic obstructive pulmonary disease) ([place]) ?Respiratory status stable. ? ? ? ? ?  ? ? ?Consultants: Gastroenterology ?Procedures performed: EGD and colonoscopy ?Disposition: Home ?Diet recommendation:  ?Regular diet ?DISCHARGE MEDICATION: ?Allergies as of 05/03/2021   ? ?   Reactions  ? Oxycodone Nausea And Vomiting  ? ?  ? ?  ?Medication List  ?   ? ?STOP taking these medications   ? ?aspirin 81 MG chewable tablet ?  ?loratadine 10 MG tablet ?Commonly known as: CLARITIN ?  ? ?  ? ?TAKE these medications   ? ?acetaminophen 325 MG tablet ?Commonly known as: TYLENOL ?Take 2 tablets (650 mg total) by mouth every 6 (six) hours as needed for mild pain (or Fever >/= 101). ?  ?b complex vitamins capsule ?Take 1 capsule by mouth daily. ?  ?budesonide 3 MG 24 hr capsule ?Commonly known as: ENTOCORT EC ?Take 3 capsules (9 mg total) by mouth daily. ?  ?multivitamin with minerals tablet ?Take 1 tablet by mouth daily. ?  ?PROBIOTIC DAILY PO ?Take by mouth. ?  ?vitamin C 250 MG tablet ?Commonly known as: ASCORBIC ACID ?Take 250 mg by mouth daily. ?  ?VITAMIN D PO ?Take by mouth. ?  ? ?  ? ? Follow-up Information   ? ? Leone Haven, MD. Go on 05/07/2021.   ?Specialty: Family Medicine ?Why: Appointment at 11:00 am. ?Contact information: ?[address] ?[address] ?[place] [address] ?[phone] ? ? ?  ?  ? ? Lucilla Lame, MD Follow up on 05/15/2021.   ?Specialty: Gastroenterology ?Why: Appointment at 1 pm. ?Contact information: ?[address] ?[place]  [place] [address] ?[phone] ? ? ?  ?  ? ?  ?  ? ?  ? ?Discharge Exam: ?Filed Weights  ? 04/30/21 2350 05/02/21 1356  ?Weight: 63 kg 63 kg  ? ?Physical Exam ?HENT:  ?   Head: Normocephalic.  ?   Mouth/Throat:  ?  Pharynx: No oropharyngeal exudate.  ?Eyes:  ?   General: Lids are normal.  ?   Conjunctiva/sclera: Conjunctivae normal.  ?Cardiovascular:  ?   Rate and Rhythm: Normal rate and regular rhythm.  ?   Heart sounds: Normal heart sounds, S1 normal and S2 normal.  ?Pulmonary:  ?   Breath sounds: No decreased breath sounds, wheezing, rhonchi or rales.  ?Abdominal:  ?   Palpations: Abdomen is soft.  ?   Tenderness: There is no abdominal tenderness.  ?Musculoskeletal:  ?   Right lower leg: No swelling.  ?   Left lower leg: No swelling.  ?Skin: ?   General: Skin is warm.  ?   Findings: No rash.  ?Neurological:  ?   Mental  Status: He is alert and oriented to person, place, and time.  ?  ? ?Condition at discharge: stable ? ?The results of significant diagnostics from this hospitalization (including imaging, microbiology, ancillary and laboratory) are listed below for reference.  ? ?Imaging Studies: ?CT Angio Abd/Pel W and/or Wo Contrast ? ?Result Date: 05/01/2021 ?CLINICAL DATA:  Lower GI bleed EXAM: CTA ABDOMEN AND PELVIS WITHOUT AND WITH CONTRAST TECHNIQUE: Multidetector CT imaging of the abdomen and pelvis was performed using the standard protocol during bolus administration of intravenous contrast. Multiplanar reconstructed images and MIPs were obtained and reviewed to evaluate the vascular anatomy. RADIATION DOSE REDUCTION: This exam was performed according to the departmental dose-optimization program which includes automated exposure control, adjustment of the mA and/or kV according to patient size and/or use of iterative reconstruction technique. CONTRAST:  38m OMNIPAQUE IOHEXOL 350 MG/ML SOLN COMPARISON:  02/28/2021 FINDINGS: VASCULAR Aorta: Atherosclerotic calcifications. Marked irregularity throughout the abdominal aorta. No aneurysm. Maximum diameter 2.7 cm in the proximal to mid abdominal aorta. No dissection. Celiac: Patent without evidence of aneurysm, dissection, vasculitis or significant stenosis. SMA: Patent without evidence of aneurysm, dissection, vasculitis or significant stenosis. Renals: 2 renal arteries bilaterally.  No stenosis. IMA: Patent without evidence of aneurysm, dissection, vasculitis or significant stenosis. Inflow: Atherosclerotic calcifications in regularity. No aneurysm, dissection or significant stenosis. Proximal Outflow: Atherosclerotic calcifications in the common femoral arteries. No significant stenosis, aneurysm or dissection. Veins: No obvious venous abnormality within the limitations of this arterial phase study. Review of the MIP images confirms the above findings. NON-VASCULAR Lower  chest: No acute findings Hepatobiliary: 3 cm cyst in the left hepatic lobe. Prior cholecystectomy. Pancreas: No focal abnormality or ductal dilatation. Spleen: No focal abnormality.  Normal size. Adrenals/Urinary Tract: No adrenal abnormality. No focal renal abnormality. No stones or hydronephrosis. Urinary bladder is unremarkable. Stomach/Bowel: Sigmoid diverticulosis. There is wall thickening within the proximal to mid sigmoid colon which may be related to diverticulosis although annular mass lesion cannot be excluded. This area had a similar appearance on prior study. No contrast extravasation to localize GI bleed. Stomach and small bowel decompressed. Lymphatic: No adenopathy Reproductive: Prostate enlargement. Other: No free fluid or free air. Musculoskeletal: No acute bony abnormality. IMPRESSION: VASCULAR Aortoiliac atherosclerosis and irregularity.  No aneurysm. No visible active contrast extravasation to localize GI bleed. NON-VASCULAR Sigmoid diverticulosis. Area of wall thickening in the proximal and mid sigmoid colon is similar to prior study. This could reflect muscular hypertrophy low related to diverticulosis. Annular mass/malignancy cannot be excluded. Consider further evaluation with optical colonoscopy after acute symptoms resolve. Electronically Signed   By: KRolm BaptiseM.D.   On: 05/01/2021 01:54   ? ?Microbiology: ?Results for orders placed or performed during the hospital encounter of 04/30/21  ?  Gastrointestinal Panel by PCR , Stool     Status: None  ? Collection Time: 05/01/21  7:46 PM  ? Specimen: Stool  ?Result Value Ref Range Status  ? Campylobacter species NOT DETECTED NOT DETECTED Final  ? Plesimonas shigelloides NOT DETECTED NOT DETECTED Final  ? Salmonella species NOT DETECTED NOT DETECTED Final  ? Yersinia enterocolitica NOT DETECTED NOT DETECTED Final  ? Vibrio species NOT DETECTED NOT DETECTED Final  ? Vibrio cholerae NOT DETECTED NOT DETECTED Final  ? Enteroaggregative E coli  (EAEC) NOT DETECTED NOT DETECTED Final  ? Enteropathogenic E coli (EPEC) NOT DETECTED NOT DETECTED Final  ? Enterotoxigenic E coli (ETEC) NOT DETECTED NOT DETECTED Final  ? Shiga like toxin producing E coli (S

## 2021-05-03 NOTE — Discharge Instructions (Signed)
Okay for regular diet as per GI ?

## 2021-05-03 NOTE — Telephone Encounter (Signed)
Patient has been scheduled for a hospital follow up on 05/07/2021. ?

## 2021-05-03 NOTE — Telephone Encounter (Signed)
Patient discharged today. Scheduled to be seen face to face within 2 business days of discharge. No further Nurse Health Advisor TCM follow up needed at this time. Per guidelines, okay to code for TCM after appointment on 05/07/21 at 11:00. ?

## 2021-05-03 NOTE — Progress Notes (Addendum)
Lamona Curl Kroner to be D/C'd Home per MD order.  Discussed prescriptions and follow up appointments with the patient. Prescriptions given to patient, medication list explained in detail. Pt verbalized understanding.Good Rx coupon given to patient. ? ?Allergies as of 05/03/2021   ? ?   Reactions  ? Oxycodone Nausea And Vomiting  ? ?  ? ?  ?Medication List  ?  ? ?STOP taking these medications   ? ?aspirin 81 MG chewable tablet ?  ?loratadine 10 MG tablet ?Commonly known as: CLARITIN ?  ? ?  ? ?TAKE these medications   ? ?acetaminophen 325 MG tablet ?Commonly known as: TYLENOL ?Take 2 tablets (650 mg total) by mouth every 6 (six) hours as needed for mild pain (or Fever >/= 101). ?  ?b complex vitamins capsule ?Take 1 capsule by mouth daily. ?  ?budesonide 3 MG 24 hr capsule ?Commonly known as: ENTOCORT EC ?Take 3 capsules (9 mg total) by mouth daily. ?  ?multivitamin with minerals tablet ?Take 1 tablet by mouth daily. ?  ?PROBIOTIC DAILY PO ?Take by mouth. ?  ?vitamin C 250 MG tablet ?Commonly known as: ASCORBIC ACID ?Take 250 mg by mouth daily. ?  ?VITAMIN D PO ?Take by mouth. ?  ? ?  ? ? ?Vitals:  ? 05/03/21 0423 05/03/21 0754  ?BP: 139/76 (!) 157/79  ?Pulse: (!) 59 (!) 51  ?Resp: 18 19  ?Temp: 99 ?F (37.2 ?C) 97.6 ?F (36.4 ?C)  ?SpO2: 96% 98%  ? ? ?Tele box removed and returned.Skin clean, dry and intact without evidence of skin break down, no evidence of skin tears noted. IV catheter discontinued intact. Site without signs and symptoms of complications. Dressing and pressure applied. Pt denies pain at this time. No complaints noted. ? ?An After Visit Summary was printed and given to the patient. ?Patient escorted via [place], and D/C home via private auto. ? ?Rolley Sims  ?

## 2021-05-04 ENCOUNTER — Telehealth: Payer: Self-pay

## 2021-05-04 NOTE — Telephone Encounter (Signed)
Transition Care Management Unsuccessful Follow-up Telephone Call ? ?Date of discharge and from where:  05/03/21 [facility] ? ?Attempts:  1st Attempt ? ?Reason for unsuccessful TCM follow-up call:  No answer/busy. Hospital follow up scheduled 05/07/21 at 11:00. No further outreach from nurse health advisor recommended. Keep all scheduled appointments.  ? ?  ?

## 2021-05-07 ENCOUNTER — Encounter: Payer: Self-pay | Admitting: Family Medicine

## 2021-05-07 ENCOUNTER — Ambulatory Visit (INDEPENDENT_AMBULATORY_CARE_PROVIDER_SITE_OTHER): Payer: Medicare Other | Admitting: Family Medicine

## 2021-05-07 DIAGNOSIS — K529 Noninfective gastroenteritis and colitis, unspecified: Secondary | ICD-10-CM

## 2021-05-07 LAB — BASIC METABOLIC PANEL
BUN: 17 mg/dL (ref 6–23)
CO2: 28 mEq/L (ref 19–32)
Calcium: 9 mg/dL (ref 8.4–10.5)
Chloride: 103 mEq/L (ref 96–112)
Creatinine, Ser: 1.4 mg/dL (ref 0.40–1.50)
GFR: 48.88 mL/min — ABNORMAL LOW (ref >60.00)
Glucose, Bld: 97 mg/dL (ref 70–99)
Potassium: 4.8 mEq/L (ref 3.5–5.1)
Sodium: 138 mEq/L (ref 135–145)

## 2021-05-07 LAB — CBC
HCT: 38.2 % — ABNORMAL LOW (ref 39.0–52.0)
Hemoglobin: 12.8 g/dL — ABNORMAL LOW (ref 13.0–17.0)
MCHC: 33.5 g/dL (ref 30.0–36.0)
MCV: 91.4 fl (ref 78.0–100.0)
Platelets: 366 10*3/uL (ref 150.0–400.0)
RBC: 4.18 Mil/uL — ABNORMAL LOW (ref 4.22–5.81)
RDW: 13.6 % (ref 11.5–15.5)
WBC: 10.5 10*3/uL (ref 4.0–10.5)

## 2021-05-07 NOTE — Patient Instructions (Signed)
Nice to see you. ?Please see GI as planned.  ?If you develop bleeding or abdominal pain again please seek medical attention.  ?

## 2021-05-07 NOTE — Assessment & Plan Note (Addendum)
New issue. Specific diagnosis is still pending. Severe colitis on colonoscopy. This is likely the cause of his prior abdominal issues.  He will remain on budesonide 9 mg daily as advised in the hospital. He will keep his upcoming appointment with GI. He will seek medical attention for any further bleeding. Discussed his fatigue and weakness could be related to the prior GI bleed, the budesonide, or him stopping nicotine and caffeine. We will check labs today.  ?

## 2021-05-07 NOTE — Progress Notes (Signed)
?[name], MD ?Phone: [phone] ? ?Christian Hebert is a 76 y.o. male who presents today for follow-up. ? ?Colitis: The patient was recently hospitalized for lower GI bleed.  He noted he had diarrhea and then developed abdominal pain followed by passing blood per his rectum.  He ended up in the emergency room.  He had a CTA abdomen and pelvis that revealed no aneurysm.  CT scan did not reveal any stenosis of his abdominal arteries.  He did have wall thickening within the proximal to mid sigmoid colon.  There was no contrast extravasation to localize a GI bleed.  He subsequently underwent colonoscopy that revealed inflammation that was continuous and circumferential from the anus to the cecum.  He had an upper endoscopy that revealed a nonobstructing Schatzki ring.  He had diffuse moderate inflammation characterized by congestion, erythema, granularity, and aphthous ulcerations in the entire stomach he had a single polyp in the duodenum.  He was started on budesonide.  He reports since discharge he has not had any abdominal pain.  He is having small bowel movements.  No diarrhea or blood in his stool.  He does report some fatigue and weakness.  He notes he is quit smoking.  He quit caffeine intake.  He has changed his diet and is eating bland foods. ? ?Social History  ? ?Tobacco Use  ?Smoking Status Former  ? Packs/day: 1.00  ? Years: 50.00  ? Pack years: 50.00  ? Types: Cigarettes  ?Smokeless Tobacco Never  ? ? ?Current Outpatient Medications on File Prior to Visit  ?Medication Sig Dispense Refill  ? acetaminophen (TYLENOL) 325 MG tablet Take 2 tablets (650 mg total) by mouth every 6 (six) hours as needed for mild pain (or Fever >/= 101).    ? b complex vitamins capsule Take 1 capsule by mouth daily.    ? budesonide (ENTOCORT EC) 3 MG 24 hr capsule Take 3 capsules (9 mg total) by mouth daily. 90 capsule 0  ? Multiple Vitamins-Minerals (MULTIVITAMIN WITH MINERALS) tablet Take 1 tablet by mouth daily.    ?  Probiotic Product (PROBIOTIC DAILY PO) Take by mouth.    ? vitamin C (ASCORBIC ACID) 250 MG tablet Take 250 mg by mouth daily.    ? VITAMIN D PO Take by mouth.    ? ?No current facility-administered medications on file prior to visit.  ? ? ? ?ROS see history of present illness ? ?Objective ? ?Physical Exam ?Vitals:  ? 05/07/21 1052  ?BP: 100/60  ?Pulse: (!) 57  ?Temp: 98.1 ?F (36.7 ?C)  ?SpO2: 94%  ? ? ?BP Readings from Last 3 Encounters:  ?05/07/21 100/60  ?05/03/21 (!) 157/79  ?03/28/21 120/70  ? ?Wt Readings from Last 3 Encounters:  ?05/07/21 135 lb 12.8 oz (61.6 kg)  ?05/02/21 139 lb (63 kg)  ?03/28/21 139 lb 6.4 oz (63.2 kg)  ? ? ?Physical Exam ?Constitutional:   ?   General: He is not in acute distress. ?   Appearance: He is not diaphoretic.  ?Cardiovascular:  ?   Rate and Rhythm: Normal rate and regular rhythm.  ?   Heart sounds: Normal heart sounds.  ?Pulmonary:  ?   Effort: Pulmonary effort is normal.  ?   Breath sounds: Normal breath sounds.  ?Abdominal:  ?   General: Bowel sounds are normal. There is no distension.  ?   Palpations: Abdomen is soft.  ?   Tenderness: There is no abdominal tenderness.  ?Skin: ?   General: Skin  is warm and dry.  ?Neurological:  ?   Mental Status: He is alert.  ? ? ? ?Assessment/Plan: Please see individual problem list. ? ?Problem List Items Addressed This Visit   ? ? Colitis  ?  New issue. Specific diagnosis is still pending. Severe colitis on colonoscopy. This is likely the cause of his prior abdominal issues.  He will remain on budesonide 9 mg daily as advised in the hospital. He will keep his upcoming appointment with GI. He will seek medical attention for any further bleeding. Discussed his fatigue and weakness could be related to the prior GI bleed, the budesonide, or him stopping nicotine and caffeine. We will check labs today.  ? ?  ?  ? Relevant Orders  ? CBC  ? Basic Metabolic Panel (BMET)  ? ? ?Return in about 3 months (around 08/07/2021). ? ?This visit occurred  during the SARS-CoV-2 public health emergency.  Safety protocols were in place, including screening questions prior to the visit, additional usage of staff PPE, and extensive cleaning of exam room while observing appropriate contact time as indicated for disinfecting solutions.  ? ? ?[name], MD ?[place] ? ?

## 2021-05-08 LAB — SURGICAL PATHOLOGY

## 2021-05-08 NOTE — Progress Notes (Signed)
FYI has follow up with you on 05/15/2021

## 2021-05-15 ENCOUNTER — Ambulatory Visit (INDEPENDENT_AMBULATORY_CARE_PROVIDER_SITE_OTHER): Payer: Medicare Other | Admitting: Gastroenterology

## 2021-05-15 ENCOUNTER — Encounter: Payer: Self-pay | Admitting: Gastroenterology

## 2021-05-15 VITALS — BP 135/73 | HR 51 | Temp 97.9°F | Ht 70.0 in | Wt 134.0 lb

## 2021-05-15 DIAGNOSIS — K529 Noninfective gastroenteritis and colitis, unspecified: Secondary | ICD-10-CM | POA: Diagnosis not present

## 2021-05-15 NOTE — Progress Notes (Signed)
? ? ?Primary Care Physician: Leone Haven, MD ? ?Primary Gastroenterologist:  Dr. Lucilla Lame ? ?Chief Complaint  ?Patient presents with  ? Hospitalization Follow-up  ?  Seen for Lower GI bleed   ? ? ?HPI: Christian Hebert is a 76 y.o. male here for follow-up after being in the hospital.  The patient had previously been set up for a colonoscopy by Dr. Vicente Males but came to the hospital due to rectal bleeding and diarrhea and underwent an EGD and colonoscopy with multiple biopsies throughout the intestines that are shown below. ? ?DIAGNOSIS:  ?A.  DUODENUM POLYP; COLD BIOPSY:  ?- DUODENAL MUCOSA WITH LYMPHANGIECTASIA/LYMPHANGIOMA.  ?- NEGATIVE FOR DYSPLASIA AND MALIGNANCY.  ?- DEEPER SECTIONS EXAMINED.  ? ?B.  STOMACH; COLD BIOPSY:  ?- CHRONIC ACTIVE GASTRITIS WITH NON-CASEATING EPITHELIOID GRANULOMATOUS  ?INFLAMMATION.  ?- IHC FOR H.PYLORI AND CMV ARE NEGATIVE.  ?- AFB AND GMS STAINS ARE NEGATIVE.  ?- POLARIZABLE MATERIAL, INTESTINAL METAPLASIA, DYSPLASIA, AND MALIGNANCY  ?ARE NOT IDENTIFIED.  ?- SEE COMMENT.  ? ?C.  COLON, RIGHT; COLD BIOPSY:  ?- MODERATE CHRONIC ACTIVE COLITIS WITH FOCAL NON-CASEATING EPITHELIOID  ?GRANULOMATOUS INFLAMMATION.  ?- FOCAL POLARIZABLE FOREIGN MATERIAL IN GIANT CELL.  ?- AFB AND GMS STAINS ARE NEGATIVE.  ?- IHC FOR CMV IS NEGATIVE.  ?- NEGATIVE FOR DYSPLASIA AND MALIGNANCY.  ?- SEE COMMENT.  ? ?D.  COLON, TRANSVERSE; COLD BIOPSY:  ?- MODERATE CHRONIC ACTIVE COLITIS WITH FOCAL NON-CASEATING EPITHELIOID  ?GRANULOMATOUS INFLAMMATION.  ?- FOCAL POLARIZABLE FOREIGN MATERIAL IN GIANT CELL.  ?- AFB AND GMS STAINS ARE NEGATIVE.  ?- IHC FOR CMV IS NEGATIVE.  ?- NEGATIVE FOR DYSPLASIA AND MALIGNANCY.  ?- SEE COMMENT.  ? ?E.  COLON, LEFT; COLD BIOPSY:  ?- MODERATE CHRONIC ACTIVE COLITIS WITH FOCAL NON-CASEATING EPITHELIOID  ?GRANULOMATOUS INFLAMMATION.  ?- AFB AND GMS STAINS ARE NEGATIVE.  ?- IHC FOR CMV IS NEGATIVE.  ?- NEGATIVE FOR DYSPLASIA AND MALIGNANCY.  ? ?As can be seen from the biopsy  results there was active inflammation with noncaseating granulomatous inflammation with AFB and Gram stains being negative.  CMV was also negative.  The differential diagnosis was reported to be possible Crohn's versus sarcoidosis versus drug-induced inflammation and histoplasma, syphilis and helminths. ? ?The patient was started on steroids by the hospitalist and states he is taking 30 mg a day and has been taking it since he was discharged with a prescription for a month.  He states that his rectal bleeding has completely stopped and so has his diarrhea.  He also reports that he has gained 3 to 4 pounds.  He also reports that he has stopped smoking ? ?Past Medical History:  ?Diagnosis Date  ? COPD (chronic obstructive pulmonary disease) ([place])   ? Coronary atherosclerosis   ? a. Treadmill Myoview 12/2015: Exercised for 8 minutes unable to achieve target heart rate, Lexiscan without significant ischemia, EF 55-65%  ? Diverticulitis   ? GERD (gastroesophageal reflux disease)   ? Inguinal hernia   ? Leukocytosis   ? Personal history of tobacco use, presenting hazards to health 07/20/2015  ? ? ?Current Outpatient Medications  ?Medication Sig Dispense Refill  ? acetaminophen (TYLENOL) 325 MG tablet Take 2 tablets (650 mg total) by mouth every 6 (six) hours as needed for mild pain (or Fever >/= 101).    ? b complex vitamins capsule Take 1 capsule by mouth daily.    ? budesonide (ENTOCORT EC) 3 MG 24 hr capsule Take 3 capsules (9 mg total) by mouth  daily. 90 capsule 0  ? Multiple Vitamins-Minerals (MULTIVITAMIN WITH MINERALS) tablet Take 1 tablet by mouth daily.    ? Probiotic Product (PROBIOTIC DAILY PO) Take by mouth.    ? vitamin C (ASCORBIC ACID) 250 MG tablet Take 250 mg by mouth daily.    ? VITAMIN D PO Take by mouth.    ? ?No current facility-administered medications for this visit.  ? ? ?Allergies as of 05/15/2021 - Review Complete 05/15/2021  ?Allergen Reaction Noted  ? Oxycodone Nausea And Vomiting 07/05/2015   ? ? ?ROS: ? ?General: Negative for anorexia, weight loss, fever, chills, fatigue, weakness. ?ENT: Negative for hoarseness, difficulty swallowing , nasal congestion. ?CV: Negative for chest pain, angina, palpitations, dyspnea on exertion, peripheral edema.  ?Respiratory: Negative for dyspnea at rest, dyspnea on exertion, cough, sputum, wheezing.  ?GI: See history of present illness. ?GU:  Negative for dysuria, hematuria, urinary incontinence, urinary frequency, nocturnal urination.  ?Endo: Negative for unusual weight change.  ?  ?Physical Examination: ? ? BP 135/73   Pulse (!) 51   Temp 97.9 ?F (36.6 ?C) (Oral)   Ht '5\' 10"'$  (1.778 m)   Wt 134 lb (60.8 kg)   BMI 19.23 kg/m?  ? ?General: Well-nourished, well-developed in no acute distress.  ?Eyes: No icterus. Conjunctivae pink. ?Neuro: Alert and oriented x 3.  Grossly intact. ?Skin: Warm and dry, no jaundice.   ?Psych: Alert and cooperative, normal mood and affect. ? ?Labs:  ?  ?Imaging Studies: ?CT Angio Abd/Pel W and/or Wo Contrast ? ?Result Date: 05/01/2021 ?CLINICAL DATA:  Lower GI bleed EXAM: CTA ABDOMEN AND PELVIS WITHOUT AND WITH CONTRAST TECHNIQUE: Multidetector CT imaging of the abdomen and pelvis was performed using the standard protocol during bolus administration of intravenous contrast. Multiplanar reconstructed images and MIPs were obtained and reviewed to evaluate the vascular anatomy. RADIATION DOSE REDUCTION: This exam was performed according to the departmental dose-optimization program which includes automated exposure control, adjustment of the mA and/or kV according to patient size and/or use of iterative reconstruction technique. CONTRAST:  34m OMNIPAQUE IOHEXOL 350 MG/ML SOLN COMPARISON:  02/28/2021 FINDINGS: VASCULAR Aorta: Atherosclerotic calcifications. Marked irregularity throughout the abdominal aorta. No aneurysm. Maximum diameter 2.7 cm in the proximal to mid abdominal aorta. No dissection. Celiac: Patent without evidence of aneurysm,  dissection, vasculitis or significant stenosis. SMA: Patent without evidence of aneurysm, dissection, vasculitis or significant stenosis. Renals: 2 renal arteries bilaterally.  No stenosis. IMA: Patent without evidence of aneurysm, dissection, vasculitis or significant stenosis. Inflow: Atherosclerotic calcifications in regularity. No aneurysm, dissection or significant stenosis. Proximal Outflow: Atherosclerotic calcifications in the common femoral arteries. No significant stenosis, aneurysm or dissection. Veins: No obvious venous abnormality within the limitations of this arterial phase study. Review of the MIP images confirms the above findings. NON-VASCULAR Lower chest: No acute findings Hepatobiliary: 3 cm cyst in the left hepatic lobe. Prior cholecystectomy. Pancreas: No focal abnormality or ductal dilatation. Spleen: No focal abnormality.  Normal size. Adrenals/Urinary Tract: No adrenal abnormality. No focal renal abnormality. No stones or hydronephrosis. Urinary bladder is unremarkable. Stomach/Bowel: Sigmoid diverticulosis. There is wall thickening within the proximal to mid sigmoid colon which may be related to diverticulosis although annular mass lesion cannot be excluded. This area had a similar appearance on prior study. No contrast extravasation to localize GI bleed. Stomach and small bowel decompressed. Lymphatic: No adenopathy Reproductive: Prostate enlargement. Other: No free fluid or free air. Musculoskeletal: No acute bony abnormality. IMPRESSION: VASCULAR Aortoiliac atherosclerosis and irregularity.  No aneurysm. No  visible active contrast extravasation to localize GI bleed. NON-VASCULAR Sigmoid diverticulosis. Area of wall thickening in the proximal and mid sigmoid colon is similar to prior study. This could reflect muscular hypertrophy low related to diverticulosis. Annular mass/malignancy cannot be excluded. Consider further evaluation with optical colonoscopy after acute symptoms resolve.  Electronically Signed   By: Rolm Baptise M.D.   On: 05/01/2021 01:54   ? ?Assessment and Plan:  ? ?Christian Hebert is a 77 y.o. y/o male who is in the hospital with rectal bleeding and diarrhea with a colonoscop

## 2021-05-15 NOTE — Patient Instructions (Signed)
I will be faxing over referral to [facility], their number is [phone] ?

## 2021-05-16 ENCOUNTER — Telehealth: Payer: Self-pay

## 2021-05-16 NOTE — Telephone Encounter (Signed)
Referral, progress notes, procedures, demographics, and insurance faxed to [facility], confirmation received of receipt ?

## 2021-05-29 MED ORDER — BUDESONIDE 3 MG PO CPEP: 9.0000 mg | ORAL_CAPSULE | Freq: Every day | ORAL | 3 refills | Status: DC

## 2021-05-29 MED ORDER — BUDESONIDE 3 MG PO CPEP: 9.0000 mg | ORAL_CAPSULE | Freq: Every day | ORAL | 3 refills | Status: AC

## 2021-05-29 NOTE — Addendum Note (Signed)
Addended by: Lurlean Nanny on: 05/29/2021 09:07 AM   Modules accepted: Orders

## 2021-05-29 NOTE — Addendum Note (Signed)
Addended by: Lurlean Nanny on: 05/29/2021 12:37 PM   Modules accepted: Orders

## 2021-06-29 ENCOUNTER — Ambulatory Visit (INDEPENDENT_AMBULATORY_CARE_PROVIDER_SITE_OTHER): Payer: Medicare Other | Admitting: Family Medicine

## 2021-06-29 ENCOUNTER — Encounter: Payer: Self-pay | Admitting: Family Medicine

## 2021-06-29 DIAGNOSIS — H903 Sensorineural hearing loss, bilateral: Secondary | ICD-10-CM | POA: Insufficient documentation

## 2021-06-29 DIAGNOSIS — R7303 Prediabetes: Secondary | ICD-10-CM | POA: Diagnosis not present

## 2021-06-29 DIAGNOSIS — H2513 Age-related nuclear cataract, bilateral: Secondary | ICD-10-CM | POA: Insufficient documentation

## 2021-06-29 DIAGNOSIS — J309 Allergic rhinitis, unspecified: Secondary | ICD-10-CM | POA: Diagnosis not present

## 2021-06-29 DIAGNOSIS — Z461 Encounter for fitting and adjustment of hearing aid: Secondary | ICD-10-CM | POA: Insufficient documentation

## 2021-06-29 DIAGNOSIS — K529 Noninfective gastroenteritis and colitis, unspecified: Secondary | ICD-10-CM | POA: Diagnosis not present

## 2021-06-29 NOTE — Assessment & Plan Note (Signed)
He will trial over-the-counter Claritin or Allegra.  If not beneficial we can prescribe a nose spray for him to try.

## 2021-07-24 ENCOUNTER — Ambulatory Visit: Payer: Medicare Other | Admitting: Gastroenterology

## 2021-07-24 DIAGNOSIS — Z1159 Encounter for screening for other viral diseases: Secondary | ICD-10-CM | POA: Diagnosis not present

## 2021-07-24 DIAGNOSIS — F17200 Nicotine dependence, unspecified, uncomplicated: Secondary | ICD-10-CM | POA: Diagnosis not present

## 2021-07-24 DIAGNOSIS — K50111 Crohn's disease of large intestine with rectal bleeding: Secondary | ICD-10-CM | POA: Diagnosis not present

## 2021-07-24 DIAGNOSIS — D869 Sarcoidosis, unspecified: Secondary | ICD-10-CM | POA: Diagnosis not present

## 2021-07-24 DIAGNOSIS — K5090 Crohn's disease, unspecified, without complications: Secondary | ICD-10-CM | POA: Diagnosis not present

## 2021-07-24 DIAGNOSIS — Z79899 Other long term (current) drug therapy: Secondary | ICD-10-CM | POA: Diagnosis not present

## 2021-07-26 DIAGNOSIS — K50919 Crohn's disease, unspecified, with unspecified complications: Secondary | ICD-10-CM | POA: Diagnosis not present

## 2021-08-09 ENCOUNTER — Telehealth: Payer: Self-pay | Admitting: Oncology

## 2021-08-09 NOTE — Telephone Encounter (Signed)
pt called in to have appts cancelled and would not like to have them r/s currently. Fells like he does not need these appointments currently,

## 2021-08-14 ENCOUNTER — Inpatient Hospital Stay: Payer: Medicare Other | Admitting: Oncology

## 2021-08-14 ENCOUNTER — Inpatient Hospital Stay: Payer: Medicare Other

## 2021-08-17 ENCOUNTER — Encounter
Admission: EM | Disposition: A | Discharge status: Home / Self Care | Payer: Self-pay | Source: Home / Self Care | Attending: Student in an Organized Health Care Education/Training Program

## 2021-08-17 ENCOUNTER — Emergency Department: Payer: Medicare Other

## 2021-08-17 ENCOUNTER — Emergency Department
Admission: EM | Admit: 2021-08-17 | Discharge: 2021-08-17 | Disposition: A | Discharge status: Home / Self Care | Payer: Medicare Other | Attending: Student in an Organized Health Care Education/Training Program | Admitting: Student in an Organized Health Care Education/Training Program

## 2021-08-17 ENCOUNTER — Other Ambulatory Visit: Payer: Self-pay

## 2021-08-17 DIAGNOSIS — J449 Chronic obstructive pulmonary disease, unspecified: Secondary | ICD-10-CM | POA: Diagnosis not present

## 2021-08-17 DIAGNOSIS — I213 ST elevation (STEMI) myocardial infarction of unspecified site: Secondary | ICD-10-CM | POA: Insufficient documentation

## 2021-08-17 DIAGNOSIS — I3139 Other pericardial effusion (noninflammatory): Secondary | ICD-10-CM | POA: Diagnosis not present

## 2021-08-17 DIAGNOSIS — R42 Dizziness and giddiness: Secondary | ICD-10-CM | POA: Insufficient documentation

## 2021-08-17 DIAGNOSIS — I700 Atherosclerosis of aorta: Secondary | ICD-10-CM | POA: Diagnosis not present

## 2021-08-17 DIAGNOSIS — R0789 Other chest pain: Secondary | ICD-10-CM | POA: Diagnosis not present

## 2021-08-17 DIAGNOSIS — R0689 Other abnormalities of breathing: Secondary | ICD-10-CM | POA: Diagnosis not present

## 2021-08-17 DIAGNOSIS — R0902 Hypoxemia: Secondary | ICD-10-CM | POA: Diagnosis not present

## 2021-08-17 DIAGNOSIS — G9389 Other specified disorders of brain: Secondary | ICD-10-CM | POA: Diagnosis not present

## 2021-08-17 DIAGNOSIS — R079 Chest pain, unspecified: Secondary | ICD-10-CM | POA: Diagnosis not present

## 2021-08-17 DIAGNOSIS — I469 Cardiac arrest, cause unspecified: Secondary | ICD-10-CM | POA: Diagnosis not present

## 2021-08-17 DIAGNOSIS — R5383 Other fatigue: Secondary | ICD-10-CM | POA: Diagnosis not present

## 2021-08-17 DIAGNOSIS — K5090 Crohn's disease, unspecified, without complications: Secondary | ICD-10-CM | POA: Diagnosis not present

## 2021-08-17 DIAGNOSIS — Z87891 Personal history of nicotine dependence: Secondary | ICD-10-CM | POA: Diagnosis not present

## 2021-08-17 DIAGNOSIS — J439 Emphysema, unspecified: Secondary | ICD-10-CM | POA: Diagnosis not present

## 2021-08-17 HISTORY — PX: PERICARDIOCENTESIS: CATH118255

## 2021-08-17 HISTORY — PX: CORONARY/GRAFT ACUTE MI REVASCULARIZATION: CATH118305

## 2021-08-17 HISTORY — PX: LEFT HEART CATH AND CORONARY ANGIOGRAPHY: CATH118249

## 2021-08-17 LAB — COMPREHENSIVE METABOLIC PANEL
ALT: 17 U/L (ref 0–44)
AST: 20 U/L (ref 15–41)
Albumin: 3.4 g/dL — ABNORMAL LOW (ref 3.5–5.0)
Alkaline Phosphatase: 57 U/L (ref 38–126)
Anion gap: 6 (ref 5–15)
BUN: 17 mg/dL (ref 8–23)
CO2: 23 mmol/L (ref 22–32)
Calcium: 8.3 mg/dL — ABNORMAL LOW (ref 8.9–10.3)
Chloride: 108 mmol/L (ref 98–111)
Creatinine, Ser: 1.53 mg/dL — ABNORMAL HIGH (ref 0.61–1.24)
GFR, Estimated: 47 mL/min — ABNORMAL LOW (ref >60)
Glucose, Bld: 110 mg/dL — ABNORMAL HIGH (ref 70–99)
Potassium: 3.8 mmol/L (ref 3.5–5.1)
Sodium: 137 mmol/L (ref 135–145)
Total Bilirubin: 0.6 mg/dL (ref 0.3–1.2)
Total Protein: 6.5 g/dL (ref 6.5–8.1)

## 2021-08-17 LAB — CBC WITH DIFFERENTIAL/PLATELET
Abs Immature Granulocytes: 0.04 10*3/uL (ref 0.00–0.07)
Basophils Absolute: 0.1 10*3/uL (ref 0.0–0.1)
Basophils Relative: 1 %
Eosinophils Absolute: 0.1 10*3/uL (ref 0.0–0.5)
Eosinophils Relative: 1 %
HCT: 41.4 % (ref 39.0–52.0)
Hemoglobin: 13.6 g/dL (ref 13.0–17.0)
Immature Granulocytes: 0 %
Lymphocytes Relative: 18 %
Lymphs Abs: 1.9 10*3/uL (ref 0.7–4.0)
MCH: 30.3 pg (ref 26.0–34.0)
MCHC: 32.9 g/dL (ref 30.0–36.0)
MCV: 92.2 fL (ref 80.0–100.0)
Monocytes Absolute: 1.4 10*3/uL — ABNORMAL HIGH (ref 0.1–1.0)
Monocytes Relative: 13 %
Neutro Abs: 7.3 10*3/uL (ref 1.7–7.7)
Neutrophils Relative %: 67 %
Platelets: 270 10*3/uL (ref 150–400)
RBC: 4.49 MIL/uL (ref 4.22–5.81)
RDW: 12.9 % (ref 11.5–15.5)
WBC: 10.9 10*3/uL — ABNORMAL HIGH (ref 4.0–10.5)
nRBC: 0 % (ref 0.0–0.2)

## 2021-08-17 LAB — LIPID PANEL
Cholesterol: 131 mg/dL (ref 0–200)
HDL: 26 mg/dL — ABNORMAL LOW (ref >40)
LDL Cholesterol: 78 mg/dL (ref 0–99)
Total CHOL/HDL Ratio: 5 RATIO
Triglycerides: 134 mg/dL (ref <150)
VLDL: 27 mg/dL (ref 0–40)

## 2021-08-17 LAB — TROPONIN I (HIGH SENSITIVITY): Troponin I (High Sensitivity): 78 ng/L — ABNORMAL HIGH (ref <18)

## 2021-08-17 LAB — PROTIME-INR
INR: 1.2 (ref 0.8–1.2)
Prothrombin Time: 15.1 seconds (ref 11.4–15.2)

## 2021-08-17 LAB — APTT: aPTT: 33 seconds (ref 24–36)

## 2021-08-17 SURGERY — CORONARY/GRAFT ACUTE MI REVASCULARIZATION
Anesthesia: Moderate Sedation

## 2021-08-17 MED ORDER — AMIODARONE HCL 150 MG/3ML IV SOLN
INTRAVENOUS | Status: AC
  Filled 2021-08-17: qty 3

## 2021-08-17 MED ORDER — FENTANYL 2500MCG IN NS 250ML (10MCG/ML) PREMIX INFUSION: 0.0000 ug/h | INTRAVENOUS | Status: DC

## 2021-08-17 MED ORDER — ETOMIDATE 2 MG/ML IV SOLN
INTRAVENOUS | Status: AC
  Filled 2021-08-17: qty 10

## 2021-08-17 MED ORDER — FENTANYL 2500MCG IN NS 250ML (10MCG/ML) PREMIX INFUSION
0.0000 ug/h | INTRAVENOUS | Status: DC
  Administered 2021-08-17: 50 ug/h via INTRAVENOUS

## 2021-08-17 MED ORDER — ATROPINE SULFATE 1 MG/10ML IJ SOSY
PREFILLED_SYRINGE | INTRAMUSCULAR | Status: DC | PRN
  Administered 2021-08-17: 1 mg via INTRAVENOUS

## 2021-08-17 MED ORDER — SODIUM BICARBONATE 8.4 % IV SOLN
INTRAVENOUS | Status: DC | PRN
  Administered 2021-08-17 (×3): 50 meq via INTRAVENOUS

## 2021-08-17 MED ORDER — SODIUM CHLORIDE 0.9 % IV SOLN: INTRAVENOUS | Status: DC

## 2021-08-17 MED ORDER — MIDAZOLAM-SODIUM CHLORIDE 100-0.9 MG/100ML-% IV SOLN
0.5000 mg/h | INTRAVENOUS | Status: DC
  Administered 2021-08-17: 2 mg/h via INTRAVENOUS

## 2021-08-17 MED ORDER — MIDAZOLAM-SODIUM CHLORIDE 100-0.9 MG/100ML-% IV SOLN
INTRAVENOUS | Status: AC
  Filled 2021-08-17: qty 100

## 2021-08-17 MED ORDER — LIDOCAINE HCL (PF) 1 % IJ SOLN
INTRAMUSCULAR | Status: DC | PRN
  Administered 2021-08-17: 5 mL

## 2021-08-17 MED ORDER — HEPARIN (PORCINE) IN NACL 1000-0.9 UT/500ML-% IV SOLN
INTRAVENOUS | Status: DC | PRN
  Administered 2021-08-17 (×2): 500 mL

## 2021-08-17 MED ORDER — HEPARIN SODIUM (PORCINE) 5000 UNIT/ML IJ SOLN: 3000.0000 [IU] | Freq: Once | INTRAMUSCULAR | Status: DC

## 2021-08-17 MED ORDER — FENTANYL 2500MCG IN NS 250ML (10MCG/ML) PREMIX INFUSION
INTRAVENOUS | Status: AC
  Filled 2021-08-17: qty 250

## 2021-08-17 MED ORDER — FENTANYL CITRATE PF 50 MCG/ML IJ SOSY
PREFILLED_SYRINGE | INTRAMUSCULAR | Status: AC
  Filled 2021-08-17: qty 2

## 2021-08-17 MED ORDER — EPINEPHRINE 1 MG/10ML IJ SOSY
PREFILLED_SYRINGE | INTRAMUSCULAR | Status: DC | PRN
  Administered 2021-08-17 (×16): 1 mg via INTRAVENOUS

## 2021-08-17 MED ORDER — NOREPINEPHRINE 4 MG/250ML-% IV SOLN
INTRAVENOUS | Status: AC
  Filled 2021-08-17: qty 250

## 2021-08-17 MED ORDER — SODIUM CHLORIDE 0.9 % IV SOLN
INTRAVENOUS | Status: DC | PRN
  Administered 2021-08-17: 1000 mL via INTRAVENOUS

## 2021-08-17 MED ORDER — VERAPAMIL HCL 2.5 MG/ML IV SOLN
INTRAVENOUS | Status: AC
  Filled 2021-08-17: qty 2

## 2021-08-17 MED ORDER — MIDAZOLAM-SODIUM CHLORIDE 100-0.9 MG/100ML-% IV SOLN: 0.5000 mg/h | INTRAVENOUS | Status: DC

## 2021-08-17 MED ORDER — FENTANYL CITRATE (PF) 100 MCG/2ML IJ SOLN
INTRAMUSCULAR | Status: AC
  Filled 2021-08-17: qty 2

## 2021-08-17 MED ORDER — HEPARIN (PORCINE) IN NACL 1000-0.9 UT/500ML-% IV SOLN
INTRAVENOUS | Status: AC
  Filled 2021-08-17: qty 1000

## 2021-08-17 MED ORDER — MIDAZOLAM HCL 2 MG/2ML IJ SOLN
INTRAMUSCULAR | Status: AC
  Filled 2021-08-17: qty 2

## 2021-08-17 MED ORDER — NOREPINEPHRINE BITARTRATE 1 MG/ML IV SOLN
INTRAVENOUS | Status: DC | PRN
  Administered 2021-08-17: 20 ug/min via INTRAVENOUS

## 2021-08-17 MED ORDER — LIDOCAINE HCL 1 % IJ SOLN
INTRAMUSCULAR | Status: AC
  Filled 2021-08-17: qty 20

## 2021-08-17 MED ORDER — EPINEPHRINE 1 MG/10ML IJ SOSY
PREFILLED_SYRINGE | INTRAMUSCULAR | Status: AC
  Filled 2021-08-17: qty 40

## 2021-08-17 MED ORDER — ASPIRIN 81 MG PO CHEW: 324.0000 mg | CHEWABLE_TABLET | Freq: Once | ORAL | Status: DC

## 2021-08-17 MED ORDER — HEPARIN SODIUM (PORCINE) 1000 UNIT/ML IJ SOLN
INTRAMUSCULAR | Status: AC
  Filled 2021-08-17: qty 10

## 2021-08-17 MED ORDER — ROCURONIUM BROMIDE 10 MG/ML (PF) SYRINGE
PREFILLED_SYRINGE | INTRAVENOUS | Status: AC
  Filled 2021-08-17: qty 10

## 2021-08-17 SURGICAL SUPPLY — 15 items
CANNULA 5F STIFF (CANNULA) ×2 IMPLANT
CATH INFINITI 5FR MULTPACK ANG (CATHETERS) ×1 IMPLANT
DRAPE BRACHIAL (DRAPES) ×1 IMPLANT
GLIDESHEATH SLEND SS 6F .021 (SHEATH) ×1 IMPLANT
GUIDEWIRE INQWIRE 1.5J.035X260 (WIRE) IMPLANT
INQWIRE 1.5J .035X260CM (WIRE) ×2
KIT SYRINGE INJ CVI SPIKEX1 (MISCELLANEOUS) ×1 IMPLANT
PACK CARDIAC CATH (CUSTOM PROCEDURE TRAY) ×2 IMPLANT
PROTECTION STATION PRESSURIZED (MISCELLANEOUS) ×2
SET ATX SIMPLICITY (MISCELLANEOUS) ×1 IMPLANT
SHEATH AVANTI 6FR X 11CM (SHEATH) ×1 IMPLANT
SHEATH PROBE COVER 6X72 (BAG) ×2 IMPLANT
STATION PROTECTION PRESSURIZED (MISCELLANEOUS) IMPLANT
TRAY PERICARDIOCENTESIS 6FX60 (TRAY / TRAY PROCEDURE) ×1 IMPLANT
TUBING CIL FLEX 10 FLL-RA (TUBING) ×1 IMPLANT

## 2021-08-20 ENCOUNTER — Encounter: Payer: Self-pay | Admitting: Internal Medicine

## 2021-08-20 ENCOUNTER — Telehealth: Payer: Self-pay

## 2021-08-20 NOTE — Telephone Encounter (Signed)
I received a call from [place] and they would like for you to sign off on the patients death report on [facility] system, the case ID is [id number].  Nolawi Kanady,cma

## 2021-08-21 NOTE — Telephone Encounter (Signed)
I called to the funeral home and made them aware that the death certificate was done online and they understood.  Rawan Riendeau,cma

## 2021-08-21 NOTE — Telephone Encounter (Signed)
Completed.

## 2021-09-07 NOTE — ED Notes (Signed)
20 mg etomiate given at this time

## 2021-09-07 NOTE — ED Triage Notes (Signed)
Pt comes via EMS from home with c/o cp that started last night. Pt lethargic .  BP-85/52 L iter fluids given HR-30-40s O2-100%   Pt on NRB MD Quentin Cornwall at bedside and MD ENd  Pt to be intubated due to condition.

## 2021-09-07 NOTE — Death Summary Note (Signed)
Death Summary    Patient ID: Christian Hebert MRN: [id number]; DOB: 1945/03/06  Admit date: August 20, 2021 Discharge date: 08-20-2021  PCP:  Leone Haven, MD   [facility] Providers Cardiologist:  Kathlyn Sacramento, MD   Discharge Diagnoses    Active Problems:   ST elevation myocardial infarction (STEMI) [facility])   Pericardial effusion   Cardiac arrest [facility])    Diagnostic Studies/Procedures    CT head - No acute intracranial abnormality.  Right frontal encephalomalacia noted.  Bedside echo - Moderate-large pericardial effusion with decreased LV and RV function _____________   History of Present Illness     Christian Hebert is a 76 y.o. male with history of coronary and aortic atherosclerosis, asymptomatic bradycardia, COPD, Crohn's disease, and prior tobacco abuse, presenting to the ED via EMS with chest pain.  He was very somnolent and unable to answer any questions.  History was obtained from EMS, the patient's wife, and the chart.  He reportedly began experiencing chest pain last night or early this morning.  He attempted to ignore it but experience worsening pain throughout the day, ultimately calling EMS in the late afternoon.  He was found to be bradycardic and hypotensive with inferior ST segment elevation on EKG.  He received aspirin and was transported [facility] ED for further management.  On arrival, he was minimally response, unable to answer questions and only intermittently following commands.  EKG showed sinus rhythm with frequent PVC's and inferior ST elevation with anterior ST depressions.  Due to his altered mental status, decision was made by Dr. Quentin Cornwall and myself to intubate him in the ED for airway protection.  He then went for head CT that showed remote stroke but no acute findings.  Decision was made to move forward with emergent cardiac catheterization.  Hospital Course     Consultants: None   The patient was transported to the cardiac cath lab from CT  for emergent cardiac catheterization and possible PCI.  However, while he was being prepped he had sudden profound bradycardia for which he received IV atropine.  He subsequently became pulseless with PEA.  CPR was started immediately.  He also received multiple doses of IV epinephrine and sodium bicarbonate.  As we were unable to achieve ROSC despite aggressive resuscitation, a bedside echocardiogram was performed.  This showed a moderate-large pericardial effusion.  Pericardiocentesis was attempted and yielded only a small amount of blood and thrombus despite bubble study confirming intrapericardial positioning.  Arterial access was also obtained in the right groin for hemodynamic monitoring.  Coronary angiography was never attempted as ROSC was not obtained.  Resuscitative efforts were stopped after ~1 hour of resuscitation and death declared at [place] on 2021-08-20.  Did the patient have an acute coronary syndrome (MI, NSTEMI, STEMI, etc) this admission?:  Yes                               AHA/ACC Clinical Performance & Quality Measures: Aspirin prescribed? - No - patient expired ADP Receptor Inhibitor (Plavix/Clopidogrel, Brilinta/Ticagrelor or Effient/Prasugrel) prescribed (includes medically managed patients)? - No - patient expired Beta Blocker prescribed? - No - patient expired High Intensity Statin (Lipitor 40-'80mg'$  or Crestor 20-'40mg'$ ) prescribed? - No - patient expired EF assessed during THIS hospitalization? - Yes For EF <40%, was ACEI/ARB prescribed? - No - Reason:  patient expired For EF <40%, Aldosterone Antagonist (Spironolactone or Eplerenone) prescribed? - No - Reason:  patient expired  Cardiac Rehab Phase II ordered (including medically managed patients)? - No - patient expired          _____________  Discharge Vitals Blood pressure (!) 54/39, pulse (!) 0, resp. rate (!) 0, height '5\' 10"'$  (1.778 m), weight 60.5 kg, SpO2 (!) 0 %.  Filed Weights   2021/09/04 1904  Weight: 60.5 kg     Labs & Radiologic Studies    CBC Recent Labs    2021-09-04 1727  WBC 10.9*  NEUTROABS 7.3  HGB 13.6  HCT 41.4  MCV 92.2  PLT 735   Basic Metabolic Panel Recent Labs    09/04/21 1727  NA 137  K 3.8  CL 108  CO2 23  GLUCOSE 110*  BUN 17  CREATININE 1.53*  CALCIUM 8.3*   Liver Function Tests Recent Labs    2021/09/04 1727  AST 20  ALT 17  ALKPHOS 57  BILITOT 0.6  PROT 6.5  ALBUMIN 3.4*   No results for input(s): "LIPASE", "AMYLASE" in the last 72 hours. High Sensitivity Troponin:   Recent Labs  Lab 2021-09-04 1727  TROPONINIHS 78*    BNP Invalid input(s): "POCBNP" D-Dimer No results for input(s): "DDIMER" in the last 72 hours. Hemoglobin A1C No results for input(s): "HGBA1C" in the last 72 hours. Fasting Lipid Panel Recent Labs    September 04, 2021 1727  CHOL 131  HDL 26*  LDLCALC 78  TRIG 134  CHOLHDL 5.0   Thyroid Function Tests No results for input(s): "TSH", "T4TOTAL", "T3FREE", "THYROIDAB" in the last 72 hours.  Invalid input(s): "FREET3" _____________  CARDIAC CATHETERIZATION  Result Date: September 04, 2021 Conclusions: Inferior/posterior STEMI complicated by refractory PEA arrest and pericardial effusion with inability to achieve ROSC.  Coronary angiogram was not performed. Moderate-large pericardial effusion with pericardiocentesis yielding 40 mL of bloody fluid with clot.  Given inferior/posterior STEMI and presumably new hemopericardium, aortic dissection may have precipitated the event. Nelva Bush, MD [facility]  CT HEAD WO CONTRAST (5MM)  Result Date: 09-04-21 CLINICAL DATA:  lethargic EXAM: CT HEAD WITHOUT CONTRAST TECHNIQUE: Contiguous axial images were obtained from the base of the skull through the vertex without intravenous contrast. RADIATION DOSE REDUCTION: This exam was performed according to the departmental dose-optimization program which includes automated exposure control, adjustment of the mA and/or kV according to patient size  and/or use of iterative reconstruction technique. COMPARISON:  None Available. BRAIN: BRAIN Right frontal lobe encephalomalacia. No evidence of large-territorial acute infarction. No parenchymal hemorrhage. No mass lesion. No extra-axial collection. No mass effect or midline shift. No hydrocephalus. Basilar cisterns are patent. Vascular: No hyperdense vessel. Foci of gas noted within the cavernous sinus region and vasculature of the face consistent with IV placement - benign. Atherosclerotic calcifications are present within the cavernous internal carotid arteries. Skull: No acute fracture or focal lesion. Sinuses/Orbits: Left ethmoid sinus mucosal thickening. Otherwise paranasal sinuses and mastoid air cells are clear. The orbits are unremarkable. Other: None. IMPRESSION: No acute intracranial abnormality. These results were called by telephone at the time of interpretation on Sep 04, 2021 at 5:55 pm to provider Nelva Bush MD, who verbally acknowledged these results. Electronically Signed   By: Iven Finn M.D.   On: September 04, 2021 17:55   DG Chest Portable 1 View  Result Date: 04-Sep-2021 CLINICAL DATA:  cp tintubation EXAM: PORTABLE CHEST 1 VIEW COMPARISON:  CT chest 03/27/2021 FINDINGS: Cardiac paddles overlie the chest. Interval placement endotracheal tube with tip terminating 8 cm above the carina. The heart and mediastinal contours are within normal limits.  Aortic calcification. Hyperinflation of the lungs. No focal consolidation. Chronic coarsened markings with no overt pulmonary edema. No pleural effusion. No pneumothorax. No acute osseous abnormality. IMPRESSION: 1. Interval placement of an endotracheal tube with tip terminating 8 cm above the carina. 2.  Emphysema (ICD10-J43.9). Electronically Signed   By: Iven Finn M.D.   On: 2021-09-02 17:50   Disposition   The patient died in the cardiac cath lab.  I spoke with his family regarding the events.  Follow-up Plans & Appointments     N/A   Discharge Medications   N/A    Outstanding Labs/Studies   N/A   Signed, Nelva Bush, MD 09-02-21, 9:16 PM

## 2021-09-07 NOTE — ED Notes (Signed)
Pt stripped and pads on pt.

## 2021-09-07 NOTE — Progress Notes (Signed)
I responded to a page from the nurse to provide spiritual support for the patient's family, due to the patient's death. I arrived at the patient's room with his wife, daughter, and family friends present. I provided spiritual support by sharing words of comfort, reading scripture, and leading in prayer.    09-06-21 2023  Clinical Encounter Type  Visited With Patient and family together  Visit Type Initial;Spiritual support;Death  Referral From Nurse  Consult/Referral To Chaplain  Spiritual Encounters  Spiritual Needs Sacred text;Prayer;Emotional;Grief support    Chaplain Dr Redgie Grayer

## 2021-09-07 NOTE — Progress Notes (Signed)
Per Dr. Harrell Gave End, he will attempt to contact pt PCP Dr. [name] and if MD not comfortable signing death certificate, Dr. Saunders Revel said he would sign it.

## 2021-09-07 NOTE — ED Notes (Signed)
Pt going to CT now and then to Cath Lab

## 2021-09-07 NOTE — ED Notes (Signed)
Pt being bagged at this time.

## 2021-09-07 NOTE — ED Notes (Signed)
50 of fent given

## 2021-09-07 NOTE — ED Notes (Addendum)
100 mg rocc given at this time.

## 2021-09-07 NOTE — Progress Notes (Signed)
Post cath, patient was taken to ICU for family to view. Pt had been pronounced in cath lab. Explained to family was being moved for them to be able to view. Lines, tubes removed. Transported on bed to ICU

## 2021-09-07 NOTE — ED Provider Notes (Addendum)
[facility] Provider Note    Event Date/Time   First MD Initiated Contact with Patient August 24, 2021 1733     (approximate)   History   Code STEMI   HPI  Christian Hebert is a 76 y.o. male   .  COPD GERD presents to the ER for evaluation of chest pain and EMS finding evidence of inferior STEMI.  On arrival to the ER patient encephalopathic slow to respond intermittently conscious.  Does have a palpable pulse and well-perfused but bradycardic.  Dr. Saunders Revel present at bedside on patient's arrival.      Physical Exam   Triage Vital Signs: ED Triage Vitals  Enc Vitals Group     BP 24-Aug-2021 1724 139/88     Pulse Rate 08-24-2021 1725 (!) 45     Resp 24-Aug-2021 1724 16     Temp --      Temp src --      SpO2 08/24/2021 1725 100 %     Weight --      Height --      Head Circumference --      Peak Flow --      Pain Score 24-Aug-2021 1724 10     Pain Loc --      Pain Edu? --      Excl. in [place]? --     Most recent vital signs: Vitals:   08/24/2021 1729 08-24-2021 1730  BP:    Pulse: 93 (!) 31  Resp: 13 19  SpO2: 100% 97%     Constitutional:drowsy Eyes: Conjunctivae are normal.  Head: Atraumatic. Nose: No congestion/rhinnorhea. Mouth/Throat: Mucous membranes are moist.   Neck: Painless ROM.  Cardiovascular:   bradycardic, no mg/r Respiratory: Normal respiratory effort.  No retractions. No wheeze  Gastrointestinal: Soft and nontender.  Musculoskeletal:  no deformity Neurologic:  sluggish, intermittently responsive Skin:  Skin is warm, dry and intact. No rash noted. Psychiatric: unable to assess    ED Results / Procedures / Treatments   Labs (all labs ordered are listed, but only abnormal results are displayed) Labs Reviewed  CBC WITH DIFFERENTIAL/PLATELET - Abnormal; Notable for the following components:      Result Value   WBC 10.9 (*)    Monocytes Absolute 1.4 (*)    All other components within normal limits  PROTIME-INR  APTT  COMPREHENSIVE  METABOLIC PANEL  LIPID PANEL  TROPONIN I (HIGH SENSITIVITY)     EKG  ED ECG REPORT I, Merlyn Lot, the attending physician, personally viewed and interpreted this ECG.   Date: 2021/08/24  EKG Time: 17:24  Rate: 46  Rhythm: first degree block  Axis: normal  Intervals:normal qt  ST&T Change: inferior stemi    RADIOLOGY Please see ED Course for my review and interpretation.  I personally reviewed all radiographic images ordered to evaluate for the above acute complaints and reviewed radiology reports and findings.  These findings were personally discussed with the patient.  Please see medical record for radiology report.    PROCEDURES:  Critical Care performed: Yes, see critical care procedure note(s)  .Critical Care  Performed by: Merlyn Lot, MD Authorized by: Merlyn Lot, MD   Critical care provider statement:    Critical care time (minutes):  20   Critical care was necessary to treat or prevent imminent or life-threatening deterioration of the following conditions:  Cardiac failure   Critical care was time spent personally by me on the following activities:  Ordering and performing treatments and interventions,  ordering and review of laboratory studies, ordering and review of radiographic studies, pulse oximetry, re-evaluation of patient's condition, review of old charts, obtaining history from patient or surrogate, examination of patient, evaluation of patient's response to treatment, discussions with primary provider, discussions with consultants and development of treatment plan with patient or surrogate Procedure Name: Intubation Date/Time: September 09, 2021 5:41 PM  Performed by: Merlyn Lot, MDPre-anesthesia Checklist: Patient identified, Emergency Drugs available, Suction available and Patient being monitored Preoxygenation: Pre-oxygenation with 100% oxygen Induction Type: IV induction and Rapid sequence Laryngoscope Size: Glidescope and 3 Grade  View: Grade II Tube size: 7.0 mm Number of attempts: 1 Airway Equipment and Method: Video-laryngoscopy Placement Confirmation: ETT inserted through vocal cords under direct vision, CO2 detector and Breath sounds checked- equal and bilateral Dental Injury: Teeth and Oropharynx as per pre-operative assessment        MEDICATIONS ORDERED IN ED: Medications  0.9 %  sodium chloride infusion (has no administration in time range)  aspirin chewable tablet 324 mg (has no administration in time range)  heparin injection 60 Units/kg (has no administration in time range)  fentaNYL 2557mg in NS 2556m(1040mml) infusion-PREMIX (has no administration in time range)  midazolam (VERSED) 100 mg/100 mL (1 mg/mL) premix infusion (2 mg/hr Intravenous New Bag/Given [id number])  midazolam (VERSED) 100 mg/100 mL (1 mg/mL) premix infusion (has no administration in time range)  fentaNYL 2500m30mn NS 250mL48mmcg68m infusion-PREMIX (has no administration in time range)     IMPRESSION / MDM / ASSESSBuckshotCOURSE  I reviewed the triage vital signs and the nursing notes.                              Differential diagnosis includes, but is not limited to, STEMI, ACS, IPH, SDH, anemia, electrolyte abnormality  Patient presented to the ER critically ill as described above.  This presenting complaint could reflect a potentially life-threatening illness therefore the patient will be placed on continuous pulse oximetry and telemetry for monitoring.  Laboratory evaluation will be sent to evaluate for the above complaints.   Due to his tenuous mental state intermittent responsiveness decision was made in conjunction with Dr. End toSaunders Reveloceed with intubation for airway protection and CT imaging given his altered mental status to rule out head bleed prior to proceeding to catheterization.  This process was performed expeditiously.  Chest x-ray on my review shows no evidence of pneumothorax appropriate position  to ET tube.  Patient transferred to Cath Lab and stable but critical condition.     FINAL CLINICAL IMPRESSION(S) / ED DIAGNOSES   Final diagnoses:  ST elevation myocardial infarction (STEMI), unspecified artery (HCC)  [facility] / DC Orders   ED Discharge Orders     None        Note:  This document was prepared using Dragon voice recognition software and may include unintentional dictation errors.    RobinsMerlyn Lot8/11/2021-09-03   RobinsMerlyn Lot8/11/September 09, 2021

## 2021-09-07 NOTE — ED Notes (Signed)
Pt tubed at this time, color change, 7-0 at lip

## 2021-09-07 NOTE — Consult Note (Signed)
Cardiology Consultation:   Patient ID: Christian Hebert MRN: [id number]; DOB: 01/01/1946  Admit date: 12-Sep-2021 Date of Consult: 09/12/2021  PCP:  Christian Haven, MD   [facility] Providers Cardiologist:  Christian Sacramento, MD   Patient Profile:   Christian Hebert is a 76 y.o. male with a hx of coronary and aortic atherosclerosis, asymptomatic bradycardia, COPD, Crohn's disease, and prior tobacco abuse who is being seen 12-Sep-2021 for the evaluation of chest pain and abnormal EKG at the request of Dr. Quentin Cornwall.  History of Present Illness:   Christian Hebert presents via EMS to the emergency department.  He is very somnolent and unable to answer any questions.  History is obtained from EMS, the patient's wife, and the chart.  He reportedly began experiencing chest pain last night or early this morning.  He attempted to ignore it but experience worsening pain throughout the day, ultimately calling EMS in the late afternoon.  He was found to be bradycardic and hypotensive with inferior ST segment elevation on EKG.  He received aspirin and was transported to the [facility] ED for further management.  On arrival, he was minimally response, unable to answer questions and only intermittently following commands.  EKG showed sinus rhythm with frequent PVC's and inferior ST elevation with anterior ST depressions.  Due to his altered mental status, decision was made by Dr. Quentin Cornwall and myself to intubate him in the ED for airway protection.  He then went for head CT that showed remote stroke but no acute findings.  Decision was made to move forward with emergent cardiac catheterization.   Past Medical History:  Diagnosis Date   COPD (chronic obstructive pulmonary disease) ([place])    Coronary atherosclerosis    a. Treadmill Myoview 12/2015: Exercised for 8 minutes unable to achieve target heart rate, Lexiscan without significant ischemia, EF 55-65%   Diverticulitis    GERD (gastroesophageal reflux disease)     Inguinal hernia    Leukocytosis    Personal history of tobacco use, presenting hazards to health 07/20/2015    Past Surgical History:  Procedure Laterality Date   COLONOSCOPY WITH PROPOFOL N/A 05/02/2021   Procedure: COLONOSCOPY WITH PROPOFOL;  Surgeon: Jonathon Bellows, MD;  Location[facility];  Service: Gastroenterology;  Laterality: N/A;   ESOPHAGOGASTRODUODENOSCOPY N/A 05/02/2021   Procedure: ESOPHAGOGASTRODUODENOSCOPY (EGD);  Surgeon: Jonathon Bellows, MD;  Location: [facility] ENDOSCOPY;  Service: Gastroenterology;  Laterality: N/A;   HERNIA REPAIR     bilateral inguinal    INGUINAL HERNIA REPAIR Bilateral 09/12/2016   Procedure: LAPAROSCOPIC BILATERAL INGUINAL HERNIA REPAIR;  Surgeon: Florene Glen, MD;  Location: [facility] ORS;  Service: General;  Laterality: Bilateral;   TONSILLECTOMY       Home Medications:  Prior to Admission medications   Medication Sig Start Date Christian Hebert Date Taking? Authorizing Provider  acetaminophen (TYLENOL) 325 MG tablet Take 2 tablets (650 mg total) by mouth every 6 (six) hours as needed for mild pain (or Fever >/= 101). 05/03/21   Wieting, Richard, MD  b complex vitamins capsule Take 1 capsule by mouth daily.    [provider]  budesonide (ENTOCORT EC) 3 MG 24 hr capsule Take 3 capsules (9 mg total) by mouth daily. 05/29/21   Christian Lame, MD  Multiple Vitamins-Minerals (MULTIVITAMIN WITH MINERALS) tablet Take 1 tablet by mouth daily.    [provider]  Probiotic Product (PROBIOTIC DAILY PO) Take by mouth.    [provider]  vitamin C (ASCORBIC ACID) 250 MG tablet Take 250  mg by mouth daily.    [provider]  VITAMIN D PO Take by mouth.    [provider]    Inpatient Medications: Scheduled Meds:  [MAR Hold] aspirin  324 mg Oral Once   [MAR Hold] heparin  3,000 Units Intravenous Once   Continuous Infusions:  sodium chloride     sodium chloride Stopped (2021/09/01 1900)   fentaNYL infusion INTRAVENOUS Stopped  (September 01, 2021 1810)   midazolam Stopped (09/01/2021 1810)   norepinephrine (LEVOPHED) 4 mg in dextrose 5 % 250 mL (0.016 mg/mL) infusion Stopped (09/01/21 1859)   PRN Meds:   Allergies:    Allergies  Allergen Reactions   Oxycodone Nausea And Vomiting    Social History:   Social History   Tobacco Use   Smoking status: Former    Packs/day: 1.00    Years: 50.00    Total pack years: 50.00    Types: Cigarettes   Smokeless tobacco: Never  Vaping Use   Vaping Use: Never used  Substance Use Topics   Alcohol use: No    Alcohol/week: 0.0 standard drinks of alcohol   Drug use: No      Family History:   Family History  Problem Relation Age of Onset   Stroke Mother    Hypertension Mother    Heart disease Father    Hypertension Father    Lung cancer Father    Heart disease Sister    Cancer Neg Hx      ROS:  Please see the history of present illness. All other ROS reviewed and negative.     Physical Exam/Data:   Vitals:   09/01/2021 1730 09/01/21 1734 01-Sep-2021 1752 01-Sep-2021 1904  BP:      Pulse: (!) 31     Resp: 19     SpO2: 97% 100% 100%   Weight:    60.5 kg  Height:  '5\' 10"'$  (1.778 m)     No intake or output data in the 24 hours ending 09-01-2021 1919    01-Sep-2021    7:04 PM 06/29/2021    9:55 AM 05/15/2021   12:40 PM  Last 3 Weights  Weight (lbs) 133 lb 6.1 oz 133 lb 6.4 oz 134 lb  Weight (kg) 60.5 kg 60.51 kg 60.782 kg     Body mass index is 19.14 kg/m.  General: Somnolent, ill-appearing man, lying on gurney. HEENT: normal Neck: no JVD Vascular: No carotid bruits; Distal pulses 2+ bilaterally Cardiac:  normal S1, S2; distant heart sounds.  Irregular without obvious murmurs. Lungs: Diminished breath sounds throughout without wheezes or crackles Abd: soft, nontender, no hepatomegaly  Ext: no edema Musculoskeletal:  No deformities. Skin: warm and dry  Neuro: Moves all 4 extremities but does not follow commands regularly. Psych: Unable to assess due to altered  mental status  EKG:  The EKG was personally reviewed and demonstrates: Sinus rhythm with PVCs, inferior ST elevation, and posterior changes. Telemetry:  Telemetry was personally reviewed and demonstrates: Sinus rhythm and intermittent sinus bradycardia with first-degree AV block.  Relevant CV Studies: None.  Laboratory Data:  High Sensitivity Troponin:   Recent Labs  Lab 09-01-2021 1727  TROPONINIHS 78*     Chemistry Recent Labs  Lab September 01, 2021 1727  NA 137  K 3.8  CL 108  CO2 23  GLUCOSE 110*  BUN 17  CREATININE 1.53*  CALCIUM 8.3*  GFRNONAA 47*  ANIONGAP 6    Recent Labs  Lab 2021-09-01 1727  PROT 6.5  ALBUMIN 3.4*  AST 20  ALT 17  ALKPHOS 57  BILITOT 0.6   Lipids  Recent Labs  Lab 2021/08/30 1727  CHOL 131  TRIG 134  HDL 26*  LDLCALC 78  CHOLHDL 5.0    Hematology Recent Labs  Lab 2021/08/30 1727  WBC 10.9*  RBC 4.49  HGB 13.6  HCT 41.4  MCV 92.2  MCH 30.3  MCHC 32.9  RDW 12.9  PLT 270   Thyroid No results for input(s): "TSH", "FREET4" in the last 168 hours.  BNPNo results for input(s): "BNP", "PROBNP" in the last 168 hours.  DDimer No results for input(s): "DDIMER" in the last 168 hours.   Radiology/Studies:  CT HEAD WO CONTRAST (5MM)  Result Date: 08/30/21 CLINICAL DATA:  lethargic EXAM: CT HEAD WITHOUT CONTRAST TECHNIQUE: Contiguous axial images were obtained from the base of the skull through the vertex without intravenous contrast. RADIATION DOSE REDUCTION: This exam was performed according to the departmental dose-optimization program which includes automated exposure control, adjustment of the mA and/or kV according to patient size and/or use of iterative reconstruction technique. COMPARISON:  None Available. BRAIN: BRAIN Right frontal lobe encephalomalacia. No evidence of large-territorial acute infarction. No parenchymal hemorrhage. No mass lesion. No extra-axial collection. No mass effect or midline shift. No hydrocephalus. Basilar cisterns  are patent. Vascular: No hyperdense vessel. Foci of gas noted within the cavernous sinus region and vasculature of the face consistent with IV placement - benign. Atherosclerotic calcifications are present within the cavernous internal carotid arteries. Skull: No acute fracture or focal lesion. Sinuses/Orbits: Left ethmoid sinus mucosal thickening. Otherwise paranasal sinuses and mastoid air cells are clear. The orbits are unremarkable. Other: None. IMPRESSION: No acute intracranial abnormality. These results were called by telephone at the time of interpretation on August 30, 2021 at 5:55 pm to provider Nelva Bush MD, who verbally acknowledged these results. Electronically Signed   By: Iven Finn M.D.   On: 30-Aug-2021 17:55   DG Chest Portable 1 View  Result Date: 08-30-2021 CLINICAL DATA:  cp tintubation EXAM: PORTABLE CHEST 1 VIEW COMPARISON:  CT chest 03/27/2021 FINDINGS: Cardiac paddles overlie the chest. Interval placement endotracheal tube with tip terminating 8 cm above the carina. The heart and mediastinal contours are within normal limits. Aortic calcification. Hyperinflation of the lungs. No focal consolidation. Chronic coarsened markings with no overt pulmonary edema. No pleural effusion. No pneumothorax. No acute osseous abnormality. IMPRESSION: 1. Interval placement of an endotracheal tube with tip terminating 8 cm above the carina. 2.  Emphysema (ICD10-J43.9). Electronically Signed   By: Iven Finn M.D.   On: 08/30/21 17:50     Assessment and Plan:   Inferior/posterior STEMI: Patient presenting with chest pain for at least 12 to 24 hours with altered mental status and intermittent hypotension.  He was intubated in the ED for airway protection with plans for emergent cardiac catheterization and possible PCI.  Further recommendations to follow procedure.  For questions or updates, please contact [place] Please consult [email or web address] for contact info under [facility]  Cardiology.  Signed, Nelva Bush, MD  August 30, 2021 7:19 PM

## 2021-09-07 NOTE — Progress Notes (Signed)
RT assisted with emergent transport from ER to CT then directly to CATH Lab. No complications during transport.

## 2021-09-07 NOTE — ED Notes (Signed)
1 liter fluid going

## 2021-09-07 DEATH — deceased

## 2021-10-03 ENCOUNTER — Ambulatory Visit: Payer: Medicare Other | Admitting: Family Medicine
